# Patient Record
Sex: Female | Born: 1960
Health system: Southern US, Community
[De-identification: ages and names within clinical notes are randomized; demographics above are authoritative.]

## PROBLEM LIST (undated history)

## (undated) DIAGNOSIS — Z789 Other specified health status: Secondary | ICD-10-CM

## (undated) DIAGNOSIS — Q2112 Patent foramen ovale: Secondary | ICD-10-CM

## (undated) DIAGNOSIS — E0590 Thyrotoxicosis, unspecified without thyrotoxic crisis or storm: Secondary | ICD-10-CM

## (undated) DIAGNOSIS — Z720 Tobacco use: Secondary | ICD-10-CM

## (undated) DIAGNOSIS — G459 Transient cerebral ischemic attack, unspecified: Secondary | ICD-10-CM

## (undated) DIAGNOSIS — E041 Nontoxic single thyroid nodule: Secondary | ICD-10-CM

## (undated) DIAGNOSIS — E7800 Pure hypercholesterolemia, unspecified: Secondary | ICD-10-CM

## (undated) DIAGNOSIS — M25511 Pain in right shoulder: Secondary | ICD-10-CM

## (undated) DIAGNOSIS — D649 Anemia, unspecified: Secondary | ICD-10-CM

## (undated) DIAGNOSIS — M1990 Unspecified osteoarthritis, unspecified site: Secondary | ICD-10-CM

## (undated) DIAGNOSIS — R200 Anesthesia of skin: Secondary | ICD-10-CM

## (undated) DIAGNOSIS — I10 Essential (primary) hypertension: Secondary | ICD-10-CM

## (undated) DIAGNOSIS — J069 Acute upper respiratory infection, unspecified: Secondary | ICD-10-CM

## (undated) DIAGNOSIS — M25552 Pain in left hip: Secondary | ICD-10-CM

## (undated) DIAGNOSIS — D179 Benign lipomatous neoplasm, unspecified: Secondary | ICD-10-CM

## (undated) DIAGNOSIS — R1032 Left lower quadrant pain: Secondary | ICD-10-CM

## (undated) DIAGNOSIS — D125 Benign neoplasm of sigmoid colon: Secondary | ICD-10-CM

## (undated) DIAGNOSIS — G8929 Other chronic pain: Secondary | ICD-10-CM

## (undated) DIAGNOSIS — Z1211 Encounter for screening for malignant neoplasm of colon: Secondary | ICD-10-CM

## (undated) DIAGNOSIS — Z9289 Personal history of other medical treatment: Secondary | ICD-10-CM

## (undated) DIAGNOSIS — R7309 Other abnormal glucose: Secondary | ICD-10-CM

## (undated) DIAGNOSIS — M79606 Pain in leg, unspecified: Secondary | ICD-10-CM

## (undated) DIAGNOSIS — Q211 Atrial septal defect: Secondary | ICD-10-CM

## (undated) DIAGNOSIS — R6882 Decreased libido: Secondary | ICD-10-CM

## (undated) DIAGNOSIS — Z972 Presence of dental prosthetic device (complete) (partial): Secondary | ICD-10-CM

## (undated) DIAGNOSIS — N9089 Other specified noninflammatory disorders of vulva and perineum: Secondary | ICD-10-CM

## (undated) DIAGNOSIS — R7303 Prediabetes: Secondary | ICD-10-CM

## (undated) DIAGNOSIS — R634 Abnormal weight loss: Secondary | ICD-10-CM

## (undated) DIAGNOSIS — S20212A Contusion of left front wall of thorax, initial encounter: Secondary | ICD-10-CM

## (undated) DIAGNOSIS — I6381 Other cerebral infarction due to occlusion or stenosis of small artery: Secondary | ICD-10-CM

## (undated) DIAGNOSIS — M79643 Pain in unspecified hand: Secondary | ICD-10-CM

## (undated) DIAGNOSIS — N907 Vulvar cyst: Secondary | ICD-10-CM

## (undated) DIAGNOSIS — R0609 Other forms of dyspnea: Secondary | ICD-10-CM

## (undated) DIAGNOSIS — H9202 Otalgia, left ear: Secondary | ICD-10-CM

## (undated) DIAGNOSIS — M25512 Pain in left shoulder: Secondary | ICD-10-CM

## (undated) HISTORY — DX: Thyrotoxicosis, unspecified without thyrotoxic crisis or storm: E05.90

## (undated) HISTORY — DX: Transient cerebral ischemic attack, unspecified: G45.9

## (undated) HISTORY — DX: Atrial septal defect: Q21.1

## (undated) HISTORY — DX: Anesthesia of skin: R20.0

## (undated) HISTORY — DX: Otalgia, left ear: H92.02

## (undated) HISTORY — DX: Other cerebral infarction due to occlusion or stenosis of small artery: I63.81

## (undated) HISTORY — DX: Pain in leg, unspecified: M79.606

## (undated) HISTORY — DX: Pain in left hip: M25.552

## (undated) HISTORY — DX: Contusion of left front wall of thorax, initial encounter: S20.212A

## (undated) HISTORY — DX: Benign neoplasm of sigmoid colon: D12.5

## (undated) HISTORY — DX: Acute upper respiratory infection, unspecified: J06.9

## (undated) HISTORY — DX: Patent foramen ovale: Q21.12

## (undated) HISTORY — DX: Pain in unspecified hand: M79.643

## (undated) HISTORY — DX: Pain in left shoulder: M25.512

## (undated) HISTORY — DX: Essential (primary) hypertension: I10

## (undated) HISTORY — DX: Other abnormal glucose: R73.09

## (undated) HISTORY — DX: Personal history of other medical treatment: Z92.89

## (undated) HISTORY — DX: Other specified health status: Z78.9

## (undated) HISTORY — DX: Other chronic pain: G89.29

## (undated) HISTORY — DX: Tobacco use: Z72.0

## (undated) HISTORY — DX: Vulvar cyst: N90.7

## (undated) HISTORY — DX: Encounter for screening for malignant neoplasm of colon: Z12.11

## (undated) HISTORY — DX: Left lower quadrant pain: R10.32

## (undated) HISTORY — DX: Other specified noninflammatory disorders of vulva and perineum: N90.89

## (undated) HISTORY — DX: Decreased libido: R68.82

## (undated) HISTORY — DX: Other forms of dyspnea: R06.09

## (undated) HISTORY — DX: Pain in right shoulder: M25.511

## (undated) HISTORY — DX: Abnormal weight loss: R63.4

## (undated) HISTORY — DX: Pure hypercholesterolemia, unspecified: E78.00

## (undated) HISTORY — PX: DILATION AND CURETTAGE OF UTERUS: SHX78

## (undated) HISTORY — DX: Benign lipomatous neoplasm, unspecified: D17.9

## (undated) HISTORY — DX: Prediabetes: R73.03

---

## 2005-10-30 ENCOUNTER — Emergency Department: Payer: Self-pay | Admitting: Emergency Medicine

## 2007-06-10 ENCOUNTER — Observation Stay: Payer: Self-pay | Admitting: Internal Medicine

## 2007-06-18 ENCOUNTER — Ambulatory Visit: Payer: Self-pay

## 2007-06-19 ENCOUNTER — Ambulatory Visit: Payer: Self-pay

## 2008-12-22 ENCOUNTER — Emergency Department: Payer: Self-pay | Admitting: Emergency Medicine

## 2009-08-20 ENCOUNTER — Emergency Department: Payer: Self-pay | Admitting: Emergency Medicine

## 2009-09-20 ENCOUNTER — Emergency Department: Payer: Self-pay | Admitting: Unknown Physician Specialty

## 2013-03-14 HISTORY — PX: BREAST CYST ASPIRATION: SHX578

## 2013-05-20 ENCOUNTER — Ambulatory Visit: Payer: Self-pay

## 2013-05-22 ENCOUNTER — Ambulatory Visit: Payer: Self-pay

## 2013-12-11 ENCOUNTER — Emergency Department: Payer: Self-pay | Admitting: Emergency Medicine

## 2013-12-11 LAB — COMPREHENSIVE METABOLIC PANEL
ALT: 20 U/L
ANION GAP: 5 — AB (ref 7–16)
Albumin: 3.5 g/dL (ref 3.4–5.0)
Alkaline Phosphatase: 65 U/L
BUN: 9 mg/dL (ref 7–18)
Bilirubin,Total: 0.4 mg/dL (ref 0.2–1.0)
CO2: 28 mmol/L (ref 21–32)
Calcium, Total: 8.5 mg/dL (ref 8.5–10.1)
Chloride: 109 mmol/L — ABNORMAL HIGH (ref 98–107)
Creatinine: 0.7 mg/dL (ref 0.60–1.30)
EGFR (African American): >60
Glucose: 96 mg/dL (ref 65–99)
OSMOLALITY: 282 (ref 275–301)
Potassium: 3.7 mmol/L (ref 3.5–5.1)
SGOT(AST): 19 U/L (ref 15–37)
SODIUM: 142 mmol/L (ref 136–145)
Total Protein: 6.8 g/dL (ref 6.4–8.2)

## 2013-12-11 LAB — CBC
HCT: 38.6 % (ref 35.0–47.0)
HGB: 12.4 g/dL (ref 12.0–16.0)
MCH: 29.4 pg (ref 26.0–34.0)
MCHC: 32.1 g/dL (ref 32.0–36.0)
MCV: 92 fL (ref 80–100)
PLATELETS: 203 10*3/uL (ref 150–440)
RBC: 4.21 10*6/uL (ref 3.80–5.20)
RDW: 14.2 % (ref 11.5–14.5)
WBC: 5.2 10*3/uL (ref 3.6–11.0)

## 2013-12-11 LAB — TROPONIN I

## 2015-01-01 ENCOUNTER — Other Ambulatory Visit (INDEPENDENT_AMBULATORY_CARE_PROVIDER_SITE_OTHER): Payer: 59

## 2015-01-01 ENCOUNTER — Other Ambulatory Visit: Payer: Self-pay | Admitting: Family Medicine

## 2015-01-01 ENCOUNTER — Encounter: Payer: Self-pay | Admitting: Family Medicine

## 2015-01-01 ENCOUNTER — Ambulatory Visit (INDEPENDENT_AMBULATORY_CARE_PROVIDER_SITE_OTHER): Payer: 59 | Admitting: Family Medicine

## 2015-01-01 VITALS — BP 172/98 | HR 68 | Temp 98.5°F | Ht 63.47 in | Wt 142.6 lb

## 2015-01-01 DIAGNOSIS — M79643 Pain in unspecified hand: Secondary | ICD-10-CM | POA: Diagnosis not present

## 2015-01-01 DIAGNOSIS — R946 Abnormal results of thyroid function studies: Secondary | ICD-10-CM | POA: Diagnosis not present

## 2015-01-01 DIAGNOSIS — R7989 Other specified abnormal findings of blood chemistry: Secondary | ICD-10-CM

## 2015-01-01 DIAGNOSIS — I10 Essential (primary) hypertension: Secondary | ICD-10-CM | POA: Insufficient documentation

## 2015-01-01 HISTORY — DX: Pain in unspecified hand: M79.643

## 2015-01-01 LAB — CBC
HCT: 39.4 % (ref 36.0–46.0)
Hemoglobin: 13 g/dL (ref 12.0–15.0)
MCHC: 33.1 g/dL (ref 30.0–36.0)
MCV: 89.5 fl (ref 78.0–100.0)
PLATELETS: 247 10*3/uL (ref 150.0–400.0)
RBC: 4.4 Mil/uL (ref 3.87–5.11)
RDW: 14.1 % (ref 11.5–15.5)
WBC: 7.3 10*3/uL (ref 4.0–10.5)

## 2015-01-01 LAB — COMPREHENSIVE METABOLIC PANEL
ALK PHOS: 59 U/L (ref 39–117)
ALT: 11 U/L (ref 0–35)
AST: 14 U/L (ref 0–37)
Albumin: 4.1 g/dL (ref 3.5–5.2)
BUN: 15 mg/dL (ref 6–23)
CO2: 31 mEq/L (ref 19–32)
Calcium: 9.7 mg/dL (ref 8.4–10.5)
Chloride: 106 mEq/L (ref 96–112)
Creatinine, Ser: 0.71 mg/dL (ref 0.40–1.20)
GFR: 111.07 mL/min (ref >60.00)
GLUCOSE: 79 mg/dL (ref 70–99)
Potassium: 3.7 mEq/L (ref 3.5–5.1)
Sodium: 142 mEq/L (ref 135–145)
TOTAL PROTEIN: 6.8 g/dL (ref 6.0–8.3)
Total Bilirubin: 0.4 mg/dL (ref 0.2–1.2)

## 2015-01-01 LAB — LIPID PANEL
Cholesterol: 189 mg/dL (ref 0–200)
HDL: 61 mg/dL (ref >39.00)
LDL Cholesterol: 109 mg/dL — ABNORMAL HIGH (ref 0–99)
NONHDL: 128.07
Total CHOL/HDL Ratio: 3
Triglycerides: 95 mg/dL (ref 0.0–149.0)
VLDL: 19 mg/dL (ref 0.0–40.0)

## 2015-01-01 LAB — TSH: TSH: 0.3 u[IU]/mL — ABNORMAL LOW (ref 0.35–4.50)

## 2015-01-01 MED ORDER — WRIST SPLINT/COCK-UP/RIGHT M MISC: 1.0000 | Freq: Every day | Status: DC

## 2015-01-01 MED ORDER — AMLODIPINE BESYLATE 5 MG PO TABS: 5.0000 mg | ORAL_TABLET | Freq: Every day | ORAL | Status: DC

## 2015-01-01 MED ORDER — WRIST SPLINT/COCK-UP/LEFT M MISC: 1.0000 | Freq: Every day | Status: DC

## 2015-01-01 NOTE — Assessment & Plan Note (Signed)
Pain in bilateral hands with associated fingertip numbness in all 10 fingers. This is a chronic issue likely related to her repetitive motions at work. She has had improvement with wearing a wrist brace making me think this is likely related to carpal tunnel syndrome. She did have negative testing Tinel's and Phalen's today. She is neurovascularly intact. She had a negative exam today. We will put her in cockup splints for her bilateral wrists and advised her to wear them nightly. We'll monitor with this intervention. Given return precautions.

## 2015-01-01 NOTE — Progress Notes (Signed)
Pre visit review using our clinic review tool, if applicable. No additional management support is needed unless otherwise documented below in the visit note. 

## 2015-01-01 NOTE — Assessment & Plan Note (Addendum)
BP elevated today. Not in the hypertensive urgency range. She is asymptomatic at this time. Vision check reveals equal vision in both eyes. Lightheadedness likely due to orthostasis though orthostatics today were negative. Has no other symptoms with that. She has no lightheadedness today. We'll continue to monitor this. We will start the patient on amlodipine 5 mg daily to limit the drop in blood pressure over the acute period given that her blood pressures probably been in this range for at least the past year. She'll follow-up in one week for nurse visit to recheck her blood pressure. She'll see me in 2 weeks. We'll check a CMET, CBC, TSH, and lipid panel. I did discuss smoking cessation with the patient today and we discussed nicotine patches, though with the patient's blood pressure being elevated and the risk for increased blood pressure on nicotine patches we will attempt to control her blood pressure first and then start nicotine patches for smoking cessation. She will contact an eye doctor for a vision check. She is given return precautions.

## 2015-01-01 NOTE — Patient Instructions (Signed)
Nice to meet you. We will start you on a blood pressure medicine called amlodipine. He should take this every day. We will prescribe you wrist splints for possible carpal tunnel syndrome. Please call and set up an appointment with an eye doctor. If you develop chest pain, shortness of breath, headache, numbness, weakness, vision changes, abdominal pain, swelling in her feet or hands, or lightheadedness please seek medical attention immediately. I will see back in one week to follow-up on her blood pressure.

## 2015-01-01 NOTE — Progress Notes (Signed)
Patient ID: Alyssa Matthews, female   DOB: 09-10-1960, 54 y.o.   MRN: [id number]  Tommi Rumps, MD Phone: [phone]  Alyssa Matthews is a 54 y.o. female who presents today for new patient visit.  HYPERTENSION Disease Monitoring Home BP Monitoring does not check Chest pain- no    Dyspnea- no Medications Compliance-  not on medication.  Edema- no Patient notes about a year ago she was seen in the ED and was noted to have had elevated blood pressure at that time. States she was prescribed medication at that time. She does not know the name of that medication. She does not remember what her blood pressure was. She notes it has been higher than it is today in the past. She has no headache. She has no chest pain. She has no shortness of breath. She has no edema. She has no abdominal pain. She does note occasional lightheadedness on going from laying or seated to standing. This gets better better after eating. She has no palpitations. She has no history of vertigo. She denies current vision issues. She does note occasionally seeing dots in her vision, though none at this time.  Hand pain: Patient notes intermittent bilateral hand pain for a number of years. She notes there is minimal associated numbness in her fingertips. She notes occasionally feels as though her fingertips are swollen though the rest of her hand is not swollen. She has no other areas of numbness. She has no weakness. She has no edema elsewhere. 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 The pain bothers her in her hands mostly when she is asleep. She does wear a brace on her right wrist occasionally and this helps with the symptoms.   Active Ambulatory Problems    Diagnosis Date Noted  . Essential hypertension 01/01/2015  . Hand pain 01/01/2015   Resolved Ambulatory Problems    Diagnosis Date Noted  . No Resolved Ambulatory Problems   Past Medical History  Diagnosis Date  .  Hypertension   . History of blood transfusion     Family History  Problem Relation Age of Onset  . Hypertension Mother   . Kidney disease Brother     Social History   Social History  . Marital Status: Single    Spouse Name: N/A  . Number of Children: N/A  . Years of Education: N/A   Occupational History  . Not on file.   Social History Main Topics  . Smoking status: Current Every Day Smoker  . Smokeless tobacco: Not on file  . Alcohol Use: 0.0 oz/week    0 Standard drinks or equivalent per week  . Drug Use: No  . Sexual Activity: Not on file   Other Topics Concern  . Not on file   Social History Narrative   Patient works on an Designer, television/film set.    ROS   General:  Negative for unexplained weight loss, fever Skin: Negative for new or changing mole, sore that won't heal HEENT: Negative for trouble hearing, trouble seeing, ringing in ears, mouth sores, hoarseness, change in voice, dysphagia. CV:  Negative for chest pain, dyspnea, edema, palpitations Resp: Negative for cough, dyspnea, hemoptysis GI: Negative for nausea, vomiting, diarrhea, constipation, abdominal pain, melena, hematochezia. GU: Negative for dysuria, incontinence, urinary hesitance, hematuria, vaginal or penile discharge, polyuria, sexual difficulty, lumps in testicle or breasts MSK: Positive for fingertip swelling in fingertip numbness, Negative for muscle cramps or aches, joint pain  or swelling Neuro: Negative for headaches, weakness, numbness, dizziness, passing out/fainting Psych: Negative for depression, anxiety, memory problems  Objective  Physical Exam Filed Vitals:   01/01/15 0802  BP: 172/98  Pulse: 68  Temp: 98.5 F (36.9 C)   Laying blood pressure 162/98 pulse 63 Sitting blood pressure 174/106 pulse 67 Standing blood pressure 168/104 pulse 69  Physical Exam  Constitutional: She is well-developed, well-nourished, and in no distress.  HENT:  Head: Normocephalic and atraumatic.  Right  Ear: External ear normal.  Left Ear: External ear normal.  Mouth/Throat: Oropharynx is clear and moist. No oropharyngeal exudate.  Eyes: Conjunctivae are normal. Pupils are equal, round, and reactive to light.  Neck: Neck supple.  Cardiovascular: Normal rate, regular rhythm and normal heart sounds.  Exam reveals no gallop and no friction rub.   No murmur heard. Pulmonary/Chest: Effort normal and breath sounds normal. No respiratory distress. She has no wheezes. She has no rales.  Abdominal: Soft. Bowel sounds are normal. She exhibits no distension. There is no tenderness. There is no rebound and no guarding.  Musculoskeletal: She exhibits no edema.  Bilateral hands and fingers with no joint swelling or soft tissue swelling, there is no erythema, there is no numbness in the fingers, negative Tinel's, negative Phalen's, 2+ radial pulses, fingers warm and well perfused  Lymphadenopathy:    She has no cervical adenopathy.  Neurological: She is alert.  CN 2-12 intact, 5/5 strength in bilateral biceps, triceps, grip, quads, hamstrings, plantar and dorsiflexion, sensation to light touch intact in bilateral UE and LE, normal gait, 2+ patellar reflexes  Skin: Skin is warm and dry. She is not diaphoretic.  Psychiatric: Mood and affect normal.     Assessment/Plan:   Essential hypertension BP elevated today. Not in the hypertensive urgency range. She is asymptomatic at this time. Vision check reveals equal vision in both eyes. Lightheadedness likely due to orthostasis though orthostatics today were negative. Has no other symptoms with that. She has no lightheadedness today. We'll continue to monitor this. We will start the patient on amlodipine 5 mg daily to limit the drop in blood pressure over the acute period given that her blood pressures probably been in this range for at least the past year. She'll follow-up in one week for nurse visit to recheck her blood pressure. She'll see me in 2 weeks. We'll  check a CMET, CBC, TSH, and lipid panel. I did discuss smoking cessation with the patient today and we discussed nicotine patches, though with the patient's blood pressure being elevated and the risk for increased blood pressure on nicotine patches we will attempt to control her blood pressure first and then start nicotine patches for smoking cessation. She will contact an eye doctor for a vision check. She is given return precautions.  Hand pain Pain in bilateral hands with associated fingertip numbness in all 10 fingers. This is a chronic issue likely related to her repetitive motions at work. She has had improvement with wearing a wrist brace making me think this is likely related to carpal tunnel syndrome. She did have negative testing Tinel's and Phalen's today. She is neurovascularly intact. She had a negative exam today. We will put her in cockup splints for her bilateral wrists and advised her to wear them nightly. We'll monitor with this intervention. Given return precautions.    Orders Placed This Encounter  Procedures  . Comp Met (CMET)  . Lipid Profile  . CBC  . TSH    Meds ordered  this encounter  Medications  . amLODipine (NORVASC) 5 MG tablet    Sig: Take 1 tablet (5 mg total) by mouth daily.    Dispense:  90 tablet    Refill:  0  . Elastic Bandages & Supports (WRIST SPLINT/COCK-UP/LEFT M) MISC    Sig: 1 Device by Does not apply route at bedtime.    Dispense:  1 each    Refill:  0  . Elastic Bandages & Supports (WRIST SPLINT/COCK-UP/RIGHT M) MISC    Sig: 1 Device by Does not apply route at bedtime.    Dispense:  1 each    Refill:  0    Tommi Rumps

## 2015-01-02 LAB — T3, FREE: T3 FREE: 3.4 pg/mL (ref 2.3–4.2)

## 2015-01-02 LAB — T4, FREE: Free T4: 0.78 ng/dL (ref 0.60–1.60)

## 2015-01-08 ENCOUNTER — Ambulatory Visit (INDEPENDENT_AMBULATORY_CARE_PROVIDER_SITE_OTHER): Payer: 59

## 2015-01-08 VITALS — BP 148/78 | HR 75 | Resp 20

## 2015-01-08 DIAGNOSIS — I10 Essential (primary) hypertension: Secondary | ICD-10-CM

## 2015-01-08 MED ORDER — ATORVASTATIN CALCIUM 40 MG PO TABS: 40.0000 mg | ORAL_TABLET | Freq: Every day | ORAL | Status: DC

## 2015-01-08 MED ORDER — AMLODIPINE BESYLATE 10 MG PO TABS: 10.0000 mg | ORAL_TABLET | Freq: Every day | ORAL | Status: DC

## 2015-01-08 NOTE — Progress Notes (Signed)
Patient ID: Alyssa Matthews, female   DOB: October 27, 1960, 54 y.o.   MRN: [id number] Patient should increase dose of amlodipine to 10 mg daily. She can take two 5 mg tablets until her prescription runs out then she can pick up the new prescription for 10 mg tablets.  I will send in lipitor 40 mg daily for the patient to start on for cholesterol.

## 2015-01-08 NOTE — Progress Notes (Addendum)
Patient came in for BP check, started her Amlodipine last week.  Has net had any issues with taking it.  Checked BP in bilateral arms.  See Vitals for documentation.  Reviewed labs with patient also as she missed a call from the [place] yesterday.  See result note for details.  Patient would like to be put on cholesterol medication, per you results note.  Please advise drug and dosage.    Please advise any changes.

## 2015-01-08 NOTE — Addendum Note (Signed)
Addended by: Leone Haven on: 01/08/2015 10:37 AM   Modules accepted: Orders

## 2015-01-08 NOTE — Progress Notes (Signed)
Left a message to return my call.

## 2015-01-09 NOTE — Progress Notes (Signed)
Spoke with patient about her labs. She will go to the pharmacy tomorrow to pick up the medications.

## 2015-01-15 ENCOUNTER — Other Ambulatory Visit (HOSPITAL_COMMUNITY)
Admission: RE | Admit: 2015-01-15 | Discharge: 2015-01-15 | Disposition: A | Discharge status: Home / Self Care | Payer: 59 | Source: Ambulatory Visit | Attending: Family Medicine | Admitting: Family Medicine

## 2015-01-15 ENCOUNTER — Encounter: Payer: Self-pay | Admitting: Family Medicine

## 2015-01-15 ENCOUNTER — Ambulatory Visit (INDEPENDENT_AMBULATORY_CARE_PROVIDER_SITE_OTHER): Payer: 59 | Admitting: Family Medicine

## 2015-01-15 VITALS — BP 122/78 | HR 77 | Temp 98.6°F | Ht 63.47 in | Wt 142.2 lb

## 2015-01-15 DIAGNOSIS — Z01411 Encounter for gynecological examination (general) (routine) with abnormal findings: Secondary | ICD-10-CM | POA: Insufficient documentation

## 2015-01-15 DIAGNOSIS — Z1151 Encounter for screening for human papillomavirus (HPV): Secondary | ICD-10-CM | POA: Diagnosis not present

## 2015-01-15 DIAGNOSIS — Z23 Encounter for immunization: Secondary | ICD-10-CM | POA: Diagnosis not present

## 2015-01-15 DIAGNOSIS — Z1231 Encounter for screening mammogram for malignant neoplasm of breast: Secondary | ICD-10-CM

## 2015-01-15 DIAGNOSIS — M25511 Pain in right shoulder: Secondary | ICD-10-CM | POA: Insufficient documentation

## 2015-01-15 DIAGNOSIS — Z0000 Encounter for general adult medical examination without abnormal findings: Secondary | ICD-10-CM | POA: Insufficient documentation

## 2015-01-15 DIAGNOSIS — Z1211 Encounter for screening for malignant neoplasm of colon: Secondary | ICD-10-CM | POA: Diagnosis not present

## 2015-01-15 DIAGNOSIS — M25512 Pain in left shoulder: Secondary | ICD-10-CM | POA: Insufficient documentation

## 2015-01-15 DIAGNOSIS — Z114 Encounter for screening for human immunodeficiency virus [HIV]: Secondary | ICD-10-CM | POA: Diagnosis not present

## 2015-01-15 DIAGNOSIS — Z1159 Encounter for screening for other viral diseases: Secondary | ICD-10-CM | POA: Diagnosis not present

## 2015-01-15 DIAGNOSIS — Z124 Encounter for screening for malignant neoplasm of cervix: Secondary | ICD-10-CM

## 2015-01-15 HISTORY — DX: Pain in left shoulder: M25.512

## 2015-01-15 HISTORY — DX: Pain in right shoulder: M25.511

## 2015-01-15 NOTE — Assessment & Plan Note (Signed)
Resolved with wrist splints. We will continue these and continue to monitor.

## 2015-01-15 NOTE — Assessment & Plan Note (Addendum)
At goal today. We'll continue amlodipine. She was advised on DASH diet. She is additionally on Lipitor given ASCVD risk percentage. She appears to be tolerating this well. She did have a single episode of muscle aches yesterday in her legs this is after a long period of time at work. She's not had any persistent aching. We will continue to monitor this if it recurs we will need to discuss decreasing her Lipitor dose. He is given return precautions.

## 2015-01-15 NOTE — Assessment & Plan Note (Signed)
Intermittent right shoulder pain mostly occurring when she lays on her right shoulder. She had no specific injury. She has benign exam today. She is neurovascularly intact. Discussed that we will continue to monitor this and if it recurs or becomes persistent she will follow-up.

## 2015-01-15 NOTE — Patient Instructions (Signed)
Nice to see you. We will call with the results of your blood work. Please monitor your shoulder pain and if this worsens please let us know.  Please look up the dash diet and use this as a guide for diet change.

## 2015-01-15 NOTE — Assessment & Plan Note (Addendum)
Appears to be doing well today. Her BMI is in the normal range. Her blood pressure is well-controlled today. Pap smear was completed today with HPV testing. We will order mammogram as well. We will order GI referral for colonoscopy. I advised on exercise and diet as well. We did discuss smoking cessation and she set a quit date for early January. She'll follow-up at that time for further discussion of quitting smoking. He is given a flu shot today. She reports having Tdap sometime in the last 5 years. We will also check a hepatitis C and HIV.

## 2015-01-15 NOTE — Progress Notes (Signed)
Patient ID: Alyssa Matthews, female   DOB: 1960/08/24, 54 y.o.   MRN: [id number]  Tommi Rumps, MD Phone: [phone]  Alyssa Matthews is a 54 y.o. female who presents today for follow-up and physical.  Patient comes in for physical exam today. She notes her last Pap smear was 2-3 years ago. She's not ever had a history of abnormal Pap smears. She is postmenopausal with her last period 3 years ago. She states she had a mammogram 2 years ago and this revealed an abnormality she had a biopsy and this ended up being a cyst. She's never had a colonoscopy. She does not exercise. Diet wise she eats whatever she wants. She notes she eats lots of chicken and pork chops. She does eat vegetables such as green beans and color drains. She does smoke and wants to set a quit date at the first of next year.  HYPERTENSION Disease Monitoring Home BP Monitoring not checking Chest pain- no    Dyspnea- no Medications Compliance-  taking amlodipine 10 mg daily. Lightheadedness-  no  Edema- no She's also on Lipitor given ASCVD risk score. He started the medication on Saturday. She denies right upper quadrant pain. Notes minimal aching in her legs yesterday after being at work for a long period of time, this resolved and she has no aching at this time.  Right shoulder discomfort: Patient notes intermittently and infrequently for several weeks she has had a sharp lateral pain in her right shoulder. This is a shooting pain. It occurs mostly when she lays on that shoulder. She thinks this is related to work. Is not worse with movement. She's not taking any medicine for it. No injury   She also notes that the wrist splints have helped with the fingertip issues. She's not had any recurrent tingling or swelling in her fingertips since wearing the response. She is wearing them nightly.   Active Ambulatory Problems    Diagnosis Date Noted  . Essential hypertension 01/01/2015  . Hand pain 01/01/2015  . Right shoulder  pain 01/15/2015  . Annual physical exam 01/15/2015   Resolved Ambulatory Problems    Diagnosis Date Noted  . No Resolved Ambulatory Problems   Past Medical History  Diagnosis Date  . Hypertension   . History of blood transfusion     Family History  Problem Relation Age of Onset  . Hypertension Mother   . Kidney disease Brother     Social History   Social History  . Marital Status: Single    Spouse Name: N/A  . Number of Children: N/A  . Years of Education: N/A   Occupational History  . Not on file.   Social History Main Topics  . Smoking status: Current Every Day Smoker  . Smokeless tobacco: Not on file  . Alcohol Use: 0.0 oz/week    0 Standard drinks or equivalent per week  . Drug Use: No  . Sexual Activity: Not on file   Other Topics Concern  . Not on file   Social History Narrative   Patient works on an Designer, television/film set.    ROS   General:  Negative for unexplained weight loss, fever Skin: Negative for new or changing mole, sore that won't heal HEENT: Negative for trouble hearing, trouble seeing, ringing in ears, mouth sores, hoarseness, change in voice, dysphagia. CV:  Negative for chest pain, dyspnea, edema, palpitations Resp: Negative for cough, dyspnea, hemoptysis GI: Negative for nausea, vomiting, diarrhea, constipation, abdominal pain, melena, hematochezia. GU: Positive  for decreased sexual desire (reports this is not bothersome for her and she would like to monitor this), Negative for dysuria, incontinence, urinary hesitance, hematuria, vaginal or penile discharge, polyuria, sexual difficulty, lumps in testicle or breasts MSK: Negative for muscle cramps or aches, joint pain or swelling Neuro: Negative for headaches, weakness, numbness, dizziness, passing out/fainting Psych: Negative for depression, anxiety, memory problems  Objective  Physical Exam Filed Vitals:   01/15/15 0758  BP: 122/78  Pulse: 77  Temp: 98.6 F (37 C)    BP Readings from  Last 3 Encounters:  01/15/15 122/78  01/08/15 148/78  01/01/15 172/98   Wt Readings from Last 3 Encounters:  01/15/15 142 lb 3.2 oz (64.501 kg)  01/01/15 142 lb 9.6 oz (64.683 kg)    Physical Exam  Constitutional: She is well-developed, well-nourished, and in no distress.  HENT:  Head: Normocephalic and atraumatic.  Right Ear: External ear normal.  Left Ear: External ear normal.  Mouth/Throat: Oropharynx is clear and moist. No oropharyngeal exudate.  Eyes: Conjunctivae are normal. Pupils are equal, round, and reactive to light.  Neck: Neck supple.  Cardiovascular: Normal rate, regular rhythm and normal heart sounds.  Exam reveals no gallop and no friction rub.   No murmur heard. Pulmonary/Chest: Effort normal and breath sounds normal. No respiratory distress. She has no wheezes. She has no rales.  Abdominal: Soft. Bowel sounds are normal. She exhibits no distension. There is no tenderness. There is no rebound and no guarding.  Genitourinary: Vagina normal, uterus normal, cervix normal, right adnexa normal and left adnexa normal. No vaginal discharge found.  Musculoskeletal: She exhibits no edema.  No asymmetry of the shoulders, right shoulder with no tenderness, swelling, or pain with range of motion, has normal range of motion, negative empty can and speeds tests, hands warm and well perfused, left shoulder with no tenderness, swelling or pain with range of motion, has normal range of motion, negative empty can and speeds tests, hands warm and well-perfused  Lymphadenopathy:    She has no cervical adenopathy.  Neurological: She is alert. Gait normal.  5 out of 5 strength in bilateral biceps, triceps, grip, sensation to light touch intact in bilateral upper extremities  Skin: Skin is warm and dry. She is not diaphoretic.  Psychiatric: Mood and affect normal.     Assessment/Plan:   Essential hypertension At goal today. We'll continue amlodipine. She was advised on DASH diet. She  is additionally on Lipitor given ASCVD risk percentage. She appears to be tolerating this well. She did have a single episode of muscle aches yesterday in her legs this is after a long period of time at work. She's not had any persistent aching. We will continue to monitor this if it recurs we will need to discuss decreasing her Lipitor dose. He is given return precautions.  Hand pain Resolved with wrist splints. We will continue these and continue to monitor.  Right shoulder pain Intermittent right shoulder pain mostly occurring when she lays on her right shoulder. She had no specific injury. She has benign exam today. She is neurovascularly intact. Discussed that we will continue to monitor this and if it recurs or becomes persistent she will follow-up.  Annual physical exam Appears to be doing well today. Her BMI is in the normal range. Her blood pressure is well-controlled today. Pap smear was completed today with HPV testing. We will order mammogram as well. We will order GI referral for colonoscopy. I advised on exercise and diet as  well. We did discuss smoking cessation and she set a quit date for early January. She'll follow-up at that time for further discussion of quitting smoking. He is given a flu shot today. She reports having Tdap sometime in the last 5 years. We will also check a hepatitis C and HIV.    Orders Placed This Encounter  Procedures  . MM Digital Screening    Standing Status: Future     Number of Occurrences:      Standing Expiration Date: 03/16/2016    Order Specific Question:  Reason for Exam (SYMPTOM  OR DIAGNOSIS REQUIRED)    Answer:  screening mammogram    Order Specific Question:  Is the patient pregnant?    Answer:  No    Order Specific Question:  Preferred imaging location?    Answer:  [facility]  . Flu Vaccine QUAD 36+ mos IM  . HIV antibody (with reflex)  . Hepatitis C Antibody  . Ambulatory referral to Gastroenterology    Referral Priority:   Routine    Referral Type:  Consultation    Referral Reason:  Specialty Services Required    Number of Visits Requested:  1    Tommi Rumps

## 2015-01-15 NOTE — Progress Notes (Signed)
Pre visit review using our clinic review tool, if applicable. No additional management support is needed unless otherwise documented below in the visit note. 

## 2015-01-16 LAB — HEPATITIS C ANTIBODY: HCV Ab: NEGATIVE

## 2015-01-16 LAB — HIV ANTIBODY (ROUTINE TESTING W REFLEX): HIV: NONREACTIVE

## 2015-01-19 ENCOUNTER — Telehealth: Payer: Self-pay

## 2015-01-19 ENCOUNTER — Other Ambulatory Visit: Payer: Self-pay

## 2015-01-19 LAB — CYTOLOGY - PAP

## 2015-01-19 NOTE — Telephone Encounter (Signed)
Gastroenterology Pre-Procedure Review  Request Date: 03/30/15 Requesting Physician: Dr. Ludwig Lean  PATIENT REVIEW QUESTIONS: The patient responded to the following health history questions as indicated:    1. Are you having any GI issues? no 2. Do you have a personal history of Polyps? no 3. Do you have a family history of Colon Cancer or Polyps? no 4. Diabetes Mellitus? no 5. Joint replacements in the past 12 months?no 6. Major health problems in the past 3 months?no 7. Any artificial heart valves, MVP, or defibrillator?no    MEDICATIONS & ALLERGIES:    Patient reports the following regarding taking any anticoagulation/antiplatelet therapy:   Plavix, Coumadin, Eliquis, Xarelto, Lovenox, Pradaxa, Brilinta, or Effient? no Aspirin? no  Patient confirms/reports the following medications:  Current Outpatient Prescriptions  Medication Sig Dispense Refill  . amLODipine (NORVASC) 10 MG tablet Take 1 tablet (10 mg total) by mouth daily. 90 tablet 1  . atorvastatin (LIPITOR) 40 MG tablet Take 1 tablet (40 mg total) by mouth daily. 90 tablet 3  . Elastic Bandages & Supports (WRIST SPLINT/COCK-UP/LEFT M) MISC 1 Device by Does not apply route at bedtime. 1 each 0  . Elastic Bandages & Supports (WRIST SPLINT/COCK-UP/RIGHT M) MISC 1 Device by Does not apply route at bedtime. 1 each 0   No current facility-administered medications for this visit.    Patient confirms/reports the following allergies:  No Known Allergies  No orders of the defined types were placed in this encounter.    AUTHORIZATION INFORMATION Primary Insurance: 1D#: Group #:  Secondary Insurance: 1D#: Group #:  SCHEDULE INFORMATION: Date: 03/30/15 Time: Location: [place]

## 2015-01-20 ENCOUNTER — Encounter: Payer: Self-pay | Admitting: Family Medicine

## 2015-01-30 ENCOUNTER — Encounter: Payer: Self-pay | Admitting: Family Medicine

## 2015-02-13 ENCOUNTER — Ambulatory Visit: Payer: 59

## 2015-02-17 ENCOUNTER — Ambulatory Visit
Admission: RE | Admit: 2015-02-17 | Discharge: 2015-02-17 | Disposition: A | Discharge status: Home / Self Care | Payer: 59 | Source: Ambulatory Visit | Attending: Family Medicine | Admitting: Family Medicine

## 2015-02-17 ENCOUNTER — Encounter: Payer: Self-pay | Admitting: Family Medicine

## 2015-02-17 DIAGNOSIS — Z1231 Encounter for screening mammogram for malignant neoplasm of breast: Secondary | ICD-10-CM | POA: Diagnosis present

## 2015-03-20 ENCOUNTER — Encounter: Payer: Self-pay | Admitting: *Deleted

## 2015-03-26 ENCOUNTER — Other Ambulatory Visit: Payer: Self-pay

## 2015-03-26 DIAGNOSIS — Z1211 Encounter for screening for malignant neoplasm of colon: Secondary | ICD-10-CM

## 2015-03-26 MED ORDER — PEG 3350-KCL-NA BICARB-NACL 420 G PO SOLR: 4000.0000 mL | ORAL | Status: DC

## 2015-03-27 NOTE — Discharge Instructions (Signed)

## 2015-03-30 ENCOUNTER — Ambulatory Visit
Admission: RE | Admit: 2015-03-30 | Discharge: 2015-03-30 | Disposition: A | Discharge status: Home / Self Care | Payer: 59 | Source: Ambulatory Visit | Attending: Gastroenterology | Admitting: Gastroenterology

## 2015-03-30 ENCOUNTER — Other Ambulatory Visit: Payer: Self-pay | Admitting: Gastroenterology

## 2015-03-30 ENCOUNTER — Encounter
Admission: RE | Disposition: A | Discharge status: Home / Self Care | Payer: Self-pay | Source: Ambulatory Visit | Attending: Gastroenterology

## 2015-03-30 ENCOUNTER — Ambulatory Visit: Payer: 59 | Admitting: Student in an Organized Health Care Education/Training Program

## 2015-03-30 ENCOUNTER — Encounter: Payer: Self-pay | Admitting: *Deleted

## 2015-03-30 DIAGNOSIS — K635 Polyp of colon: Secondary | ICD-10-CM | POA: Diagnosis not present

## 2015-03-30 DIAGNOSIS — D125 Benign neoplasm of sigmoid colon: Secondary | ICD-10-CM

## 2015-03-30 DIAGNOSIS — I10 Essential (primary) hypertension: Secondary | ICD-10-CM | POA: Insufficient documentation

## 2015-03-30 DIAGNOSIS — Z803 Family history of malignant neoplasm of breast: Secondary | ICD-10-CM | POA: Insufficient documentation

## 2015-03-30 DIAGNOSIS — Z841 Family history of disorders of kidney and ureter: Secondary | ICD-10-CM | POA: Diagnosis not present

## 2015-03-30 DIAGNOSIS — F17200 Nicotine dependence, unspecified, uncomplicated: Secondary | ICD-10-CM | POA: Diagnosis not present

## 2015-03-30 DIAGNOSIS — Z9889 Other specified postprocedural states: Secondary | ICD-10-CM | POA: Diagnosis not present

## 2015-03-30 DIAGNOSIS — Z79899 Other long term (current) drug therapy: Secondary | ICD-10-CM | POA: Insufficient documentation

## 2015-03-30 DIAGNOSIS — E7800 Pure hypercholesterolemia, unspecified: Secondary | ICD-10-CM | POA: Diagnosis not present

## 2015-03-30 DIAGNOSIS — Z8249 Family history of ischemic heart disease and other diseases of the circulatory system: Secondary | ICD-10-CM | POA: Insufficient documentation

## 2015-03-30 DIAGNOSIS — Z1211 Encounter for screening for malignant neoplasm of colon: Secondary | ICD-10-CM | POA: Diagnosis not present

## 2015-03-30 HISTORY — PX: COLONOSCOPY WITH PROPOFOL: SHX5780

## 2015-03-30 HISTORY — PX: POLYPECTOMY: SHX5525

## 2015-03-30 HISTORY — DX: Presence of dental prosthetic device (complete) (partial): Z97.2

## 2015-03-30 HISTORY — DX: Pure hypercholesterolemia, unspecified: E78.00

## 2015-03-30 SURGERY — COLONOSCOPY WITH PROPOFOL
Anesthesia: Monitor Anesthesia Care | Wound class: Contaminated

## 2015-03-30 MED ORDER — OXYCODONE HCL 5 MG PO TABS: 5.0000 mg | ORAL_TABLET | Freq: Once | ORAL | Status: DC | PRN

## 2015-03-30 MED ORDER — LACTATED RINGERS IV SOLN
INTRAVENOUS | Status: DC
  Administered 2015-03-30: 10:00:00 via INTRAVENOUS

## 2015-03-30 MED ORDER — PROPOFOL 10 MG/ML IV BOLUS
INTRAVENOUS | Status: DC | PRN
  Administered 2015-03-30: 20 mg via INTRAVENOUS
  Administered 2015-03-30: 30 mg via INTRAVENOUS
  Administered 2015-03-30: 20 mg via INTRAVENOUS
  Administered 2015-03-30: 30 mg via INTRAVENOUS
  Administered 2015-03-30: 70 mg via INTRAVENOUS
  Administered 2015-03-30: 30 mg via INTRAVENOUS
  Administered 2015-03-30 (×2): 20 mg via INTRAVENOUS

## 2015-03-30 MED ORDER — LIDOCAINE HCL (CARDIAC) 20 MG/ML IV SOLN
INTRAVENOUS | Status: DC | PRN
  Administered 2015-03-30: 50 mg via INTRAVENOUS

## 2015-03-30 MED ORDER — OXYCODONE HCL 5 MG/5ML PO SOLN: 5.0000 mg | Freq: Once | ORAL | Status: DC | PRN

## 2015-03-30 MED ORDER — STERILE WATER FOR IRRIGATION IR SOLN
Status: DC | PRN
  Administered 2015-03-30: 11:00:00

## 2015-03-30 SURGICAL SUPPLY — 28 items

## 2015-03-30 NOTE — Transfer of Care (Signed)
Immediate Anesthesia Transfer of Care Note  Patient: Alyssa Matthews  Procedure(s) Performed: Procedure(s): COLONOSCOPY WITH PROPOFOL (N/A) POLYPECTOMY  Patient Location: PACU  Anesthesia Type: MAC  Level of Consciousness: awake, alert  and patient cooperative  Airway and Oxygen Therapy: Patient Spontanous Breathing and Patient connected to supplemental oxygen  Post-op Assessment: Post-op Vital signs reviewed, Patient's Cardiovascular Status Stable, Respiratory Function Stable, Patent Airway and No signs of Nausea or vomiting  Post-op Vital Signs: Reviewed and stable  Complications: No apparent anesthesia complications

## 2015-03-30 NOTE — H&P (Signed)
  [facility]  [address], [place], [place] [address] Phone: [phone] Fax : [phone]  Primary Care Physician:  Tommi Rumps, MD Primary Gastroenterologist:  Dr. Allen Norris  Pre-Procedure History & Physical: HPI:  Alyssa Matthews is a 55 y.o. female is here for a screening colonoscopy.   Past Medical History  Diagnosis Date  . Hypertension   . History of blood transfusion   . Wears dentures     partial upper  . Hypercholesteremia     Past Surgical History  Procedure Laterality Date  . Breast cyst aspiration  2015  . Dilation and curettage of uterus      Prior to Admission medications   Medication Sig Start Date End Date Taking? Authorizing Provider  amLODipine (NORVASC) 10 MG tablet Take 1 tablet (10 mg total) by mouth daily. 01/08/15  Yes Leone Haven, MD  atorvastatin (LIPITOR) 40 MG tablet Take 1 tablet (40 mg total) by mouth daily. 01/08/15  Yes Leone Haven, MD  Elastic Bandages & Supports (WRIST SPLINT/COCK-UP/LEFT M) MISC 1 Device by Does not apply route at bedtime. 01/01/15  Yes Leone Haven, MD  Elastic Bandages & Supports (WRIST SPLINT/COCK-UP/RIGHT M) MISC 1 Device by Does not apply route at bedtime. 01/01/15  Yes Leone Haven, MD  polyethylene glycol-electrolytes (TRILYTE) 420 g solution Take 4,000 mLs by mouth as directed. Drink one 8 oz glass every 30 mins until stools are clear 03/26/15  Yes Lucilla Lame, MD    Allergies as of 01/19/2015  . (No Known Allergies)    Family History  Problem Relation Age of Onset  . Hypertension Mother   . Kidney disease Brother   . Breast cancer Neg Hx     Social History   Social History  . Marital Status: Single    Spouse Name: N/A  . Number of Children: N/A  . Years of Education: N/A   Occupational History  . Not on file.   Social History Main Topics  . Smoking status: Current Every Day Smoker -- 0.50 packs/day for 30 years  . Smokeless tobacco: Not on file  .  Alcohol Use: 0.6 oz/week    0 Standard drinks or equivalent, 1 Cans of beer per week  . Drug Use: No  . Sexual Activity: Not on file   Other Topics Concern  . Not on file   Social History Narrative   Patient works on an Design[employer], television/film set.    Review of Systems: See HPI, otherwise negative ROS  Physical Exam: BP 117/71 mmHg  Pulse 71  Temp(Src) 97.9 F (36.6 C)  Resp 16  Ht 5' 3.5" (1.613 m)  Wt 143 lb (64.864 kg)  BMI 24.93 kg/m2  SpO2 100% General:   Alert,  pleasant and cooperative in NAD Head:  Normocephalic and atraumatic. Neck:  Supple; no masses or thyromegaly. Lungs:  Clear throughout to auscultation.    Heart:  Regular rate and rhythm. Abdomen:  Soft, nontender and nondistended. Normal bowel sounds, without guarding, and without rebound.   Neurologic:  Alert and  oriented x4;  grossly normal neurologically.  Impression/Plan: Alyssa Matthews is now here to undergo a screening colonoscopy.  Risks, benefits, and alternatives regarding colonoscopy have been reviewed with the patient.  Questions have been answered.  All parties agreeable.

## 2015-03-30 NOTE — Anesthesia Postprocedure Evaluation (Signed)
Anesthesia Post Note  Patient: Alyssa Matthews  Procedure(s) Performed: Procedure(s) (LRB): COLONOSCOPY WITH PROPOFOL (N/A) POLYPECTOMY  Patient location during evaluation: PACU Anesthesia Type: MAC Level of consciousness: awake and alert Pain management: pain level controlled Vital Signs Assessment: post-procedure vital signs reviewed and stable Respiratory status: spontaneous breathing, nonlabored ventilation, respiratory function stable and patient connected to nasal cannula oxygen Cardiovascular status: stable and blood pressure returned to baseline Anesthetic complications: no    Cire Clute

## 2015-03-30 NOTE — Anesthesia Preprocedure Evaluation (Addendum)
Anesthesia Evaluation  Patient identified by MRN, date of birth, ID band  Reviewed: NPO status   History of Anesthesia Complications Negative for: history of anesthetic complications  Airway Mallampati: II  TM Distance: >3 FB Neck ROM: full    Dental  (+) Upper Dentures, Missing,  Poor dentition; many missing:   Pulmonary Current Smoker,    Pulmonary exam normal        Cardiovascular Exercise Tolerance: Good hypertension, Normal cardiovascular exam     Neuro/Psych negative neurological ROS  negative psych ROS   GI/Hepatic negative GI ROS, Neg liver ROS,   Endo/Other  negative endocrine ROS  Renal/GU negative Renal ROS  negative genitourinary   Musculoskeletal Wrist / arm pain   Abdominal   Peds  Hematology negative hematology ROS (+)   Anesthesia Other Findings   Reproductive/Obstetrics                            Anesthesia Physical Anesthesia Plan  ASA: II  Anesthesia Plan: MAC   Post-op Pain Management:    Induction:   Airway Management Planned:   Additional Equipment:   Intra-op Plan:   Post-operative Plan:   Informed Consent: I have reviewed the patients History and Physical, chart, labs and discussed the procedure including the risks, benefits and alternatives for the proposed anesthesia with the patient or authorized representative who has indicated his/her understanding and acceptance.     Plan Discussed with: CRNA  Anesthesia Plan Comments:        Anesthesia Quick Evaluation

## 2015-03-30 NOTE — Op Note (Signed)
[facility] Patient Name: Alyssa Matthews Procedure Date: 03/30/2015 10:24 AM MRN: [id number] Account #: [id number] Date of Birth: [birth year] Admit Type: Outpatient Age: 55 Room: [facility] OR ROOM 01 Gender: Female Note Status: Finalized Procedure:         Colonoscopy Indications:       Screening for colorectal malignant neoplasm Providers:         Lucilla Lame, MD Referring MD:      Randall Hiss g. Caryl Bis (Referring MD) Medicines:         Propofol per Anesthesia Complications:     No immediate complications. Procedure:         Pre-Anesthesia Assessment:                    - Prior to the procedure, a History and Physical was                     performed, and patient medications and allergies were                     reviewed. The patient's tolerance of previous anesthesia                     was also reviewed. The risks and benefits of the procedure                     and the sedation options and risks were discussed with the                     patient. All questions were answered, and informed consent                     was obtained. Prior Anticoagulants: The patient has taken                     no previous anticoagulant or antiplatelet agents. ASA                     Grade Assessment: II - A patient with mild systemic                     disease. After reviewing the risks and benefits, the                     patient was deemed in satisfactory condition to undergo                     the procedure.                    After obtaining informed consent, the colonoscope was                     passed under direct vision. Throughout the procedure, the                     patient's blood pressure, pulse, and oxygen saturations                     were monitored continuously. The Olympus CF H180AL                     colonoscope (S#: [id number]) was introduced through the anus  and advanced to the the cecum, identified by appendiceal               orifice and ileocecal valve. The colonoscopy was performed                     without difficulty. The patient tolerated the procedure                     well. The quality of the bowel preparation was excellent. Findings:      The perianal and digital rectal examinations were normal.      Three sessile polyps were found in the sigmoid colon. The polyps were 2       to 4 mm in size. These polyps were removed with a cold biopsy forceps.       Resection and retrieval were complete. Impression:        - Three 2 to 4 mm polyps in the sigmoid colon. Resected                     and retrieved. Recommendation:    - Await pathology results.                    - Repeat colonoscopy in 5 years if polyp adenoma and 10                     years if hyperplastic Procedure Code(s): --- Professional ---                    [phone], Colonoscopy, flexible; with biopsy, single or                     multiple Diagnosis Code(s): --- Professional ---                    Z12.11, Encounter for screening for malignant neoplasm of                     colon                    D12.5, Benign neoplasm of sigmoid colon CPT copyright 2014 American Medical Association. All rights reserved. The codes documented in this report are preliminary and upon coder review may  be revised to meet current compliance requirements. Lucilla Lame, MD 03/30/2015 10:46:40 AM This report has been signed electronically. Number of Addenda: 0 Note Initiated On: 03/30/2015 10:24 AM Scope Withdrawal Time: 0 hours 5 minutes 48 seconds  Total Procedure Duration: 0 hours 10 minutes 37 seconds       [facility], [facility], [facility]

## 2015-03-30 NOTE — Anesthesia Procedure Notes (Signed)
Procedure Name: MAC Performed by: Markeesha Char Pre-anesthesia Checklist: Patient identified, Emergency Drugs available, Suction available, Timeout performed and Patient being monitored Patient Re-evaluated:Patient Re-evaluated prior to inductionOxygen Delivery Method: Nasal cannula Placement Confirmation: positive ETCO2       

## 2015-03-31 ENCOUNTER — Encounter: Payer: Self-pay | Admitting: Gastroenterology

## 2015-04-02 ENCOUNTER — Encounter: Payer: Self-pay | Admitting: Gastroenterology

## 2015-04-16 ENCOUNTER — Encounter: Payer: Self-pay | Admitting: Family Medicine

## 2015-04-16 ENCOUNTER — Ambulatory Visit (INDEPENDENT_AMBULATORY_CARE_PROVIDER_SITE_OTHER): Payer: 59 | Admitting: Family Medicine

## 2015-04-16 VITALS — BP 130/72 | HR 72 | Temp 98.5°F | Ht 63.47 in | Wt 148.4 lb

## 2015-04-16 DIAGNOSIS — M791 Myalgia, unspecified site: Secondary | ICD-10-CM

## 2015-04-16 DIAGNOSIS — M79605 Pain in left leg: Secondary | ICD-10-CM | POA: Diagnosis not present

## 2015-04-16 DIAGNOSIS — R6882 Decreased libido: Secondary | ICD-10-CM

## 2015-04-16 DIAGNOSIS — I10 Essential (primary) hypertension: Secondary | ICD-10-CM

## 2015-04-16 DIAGNOSIS — D179 Benign lipomatous neoplasm, unspecified: Secondary | ICD-10-CM | POA: Diagnosis not present

## 2015-04-16 DIAGNOSIS — M79606 Pain in leg, unspecified: Secondary | ICD-10-CM | POA: Insufficient documentation

## 2015-04-16 HISTORY — DX: Pain in leg, unspecified: M79.606

## 2015-04-16 HISTORY — DX: Benign lipomatous neoplasm, unspecified: D17.9

## 2015-04-16 HISTORY — DX: Decreased libido: R68.82

## 2015-04-16 LAB — COMPREHENSIVE METABOLIC PANEL
ALK PHOS: 149 U/L — AB (ref 39–117)
ALT: 40 U/L — AB (ref 0–35)
AST: 26 U/L (ref 0–37)
Albumin: 4 g/dL (ref 3.5–5.2)
BILIRUBIN TOTAL: 0.4 mg/dL (ref 0.2–1.2)
BUN: 12 mg/dL (ref 6–23)
CO2: 27 meq/L (ref 19–32)
CREATININE: 0.57 mg/dL (ref 0.40–1.20)
Calcium: 9.4 mg/dL (ref 8.4–10.5)
Chloride: 108 mEq/L (ref 96–112)
GFR: 142.95 mL/min (ref >60.00)
GLUCOSE: 99 mg/dL (ref 70–99)
Potassium: 3.7 mEq/L (ref 3.5–5.1)
Sodium: 144 mEq/L (ref 135–145)
TOTAL PROTEIN: 6.8 g/dL (ref 6.0–8.3)

## 2015-04-16 LAB — CK: Total CK: 79 U/L (ref 7–177)

## 2015-04-16 LAB — LDL CHOLESTEROL, DIRECT: Direct LDL: 75 mg/dL

## 2015-04-16 NOTE — Assessment & Plan Note (Signed)
Lesion on left flank is most consistent with lipoma. Discussed benign nature of this with patient. also discussed obtaining an ultrasound to confirm this, though patient declined this at this time and opted to continue monitoring. She's given return precautions.

## 2015-04-16 NOTE — Assessment & Plan Note (Signed)
Decreased sex drive since onset of menopause. She does report vaginal dryness that might be contributing to this. We will have patient try vaginal moisturizer and lubricant over-the-counter with sexual activity to see if this is beneficial. Did discuss treatment with hormone therapy, though we decided to wait on this to see if she had improvement.

## 2015-04-16 NOTE — Assessment & Plan Note (Signed)
Patient with several weeks of left lower leg discomfort. This is resolved at this time. She has a benign exam today. She is neurovascularly intact. Possibly related to her cholesterol medication given that this improved after stopping this. Unlikely DVT given lack of swelling, calf tenderness, and cords. Unlikely coming from her back given lack of back pain. No red flags. Could be related arthritis in her knees. We'll continue to monitor. We'll check a CK to evaluate for cause of Lipitor. Given return precautions.

## 2015-04-16 NOTE — Assessment & Plan Note (Signed)
At goal. Asymptomatic. We'll continue Norvasc. We'll check CMP, CK, and direct LDL to evaluate whether or not to start back on Lipitor and at what dose.

## 2015-04-16 NOTE — Progress Notes (Signed)
Patient ID: Alyssa Matthews, female   DOB: 08/19/1960, 55 y.o.   MRN: [id number]  Alyssa Rumps, MD Phone: [phone]  Alyssa Matthews is a 55 y.o. female who presents today for follow-up.  Left leg pain: Patient notes several weeks ago she developed left leg discomfort that is described as an aching. Notes it occurred from her feet up to her knee. She had no swelling in her calf. No weakness. She does note maybe her toes felt a little numb, though this resolved and she attributes this to wearing steel toed boots. No back pain with this. No saddle anesthesia, bowel or bladder incontinence, fevers, or history of cancer. Notes wearing a knee support helped. Notes the pain resolved after stopping her Lipitor. No pain in the last week. No history of DVT. No recent surgeries. No recent long trips. Denies injury.  Lipoma: Patient notes having a knot on her left flank. It is been there for many years. It does not hurt. There are no skin changes overlying this. She dates she's been told it was a lipoma. Notes it has grown mildly in size over the years. It is soft.  Decreased sex drive: Patient notes decreased sexual desire since she went through menopause. She does note vaginal dryness since going through menopause. Notes this has been occurring over the last 2 years. She states the dryness makes sex uncomfortable. There is no overt pain with sex. She denies depression. She notes having sex every 1-2 weeks with her husband. No vaginal bleeding. Last menstrual period was 2 years ago.  Hypertension: Patient is taking her blood pressure medicine. No chest pain or shortness of breath. She is on a statin for cardiovascular protection though has been off of that over the last week.  PMH: Smoker   ROS see history of present illness  Objective  Physical Exam Filed Vitals:   04/16/15 0900  BP: 130/72  Pulse: 72  Temp: 98.5 F (36.9 C)    BP Readings from Last 3 Encounters:  04/16/15 130/72    03/30/15 122/72  01/15/15 122/78   Wt Readings from Last 3 Encounters:  04/16/15 148 lb 6.4 oz (67.314 kg)  03/30/15 143 lb (64.864 kg)  01/15/15 142 lb 3.2 oz (64.501 kg)    Physical Exam  Constitutional: She is well-developed, well-nourished, and in no distress.  HENT:  Head: Normocephalic and atraumatic.  Cardiovascular: Normal rate, regular rhythm and normal heart sounds.  Exam reveals no gallop and no friction rub.   No murmur heard. Pulmonary/Chest: Effort normal. No respiratory distress. She has no wheezes. She has no rales.  Abdominal: Soft. She exhibits no distension. There is no tenderness. There is no rebound and no guarding.  Genitourinary:  Normal labia, mildly atrophic vaginal mucosa, normal cervix, no discomfort on bimanual exam, no cervical motion tenderness, no adnexal masses or tenderness  Musculoskeletal: She exhibits no edema.  Left flank with 4-5 cm soft tissue mass consistent with lipoma, no overlying skin changes, no tenderness, no warmth  Neurological: She is alert. Gait normal.  Skin: Skin is warm and dry. She is not diaphoretic.   left calf 34 cm, right calf 33 cm Lateral feet warm and well perfused.   Assessment/Plan: Please see individual problem list.  Leg pain Patient with several weeks of left lower leg discomfort. This is resolved at this time. She has a benign exam today. She is neurovascularly intact. Possibly related to her cholesterol medication given that this improved after stopping this. Unlikely DVT  given lack of swelling, calf tenderness, and cords. Unlikely coming from her back given lack of back pain. No red flags. Could be related arthritis in her knees. We'll continue to monitor. We'll check a CK to evaluate for cause of Lipitor. Given return precautions.  Lipoma Lesion on left flank is most consistent with lipoma. Discussed benign nature of this with patient. also discussed obtaining an ultrasound to confirm this, though patient  declined this at this time and opted to continue monitoring. She's given return precautions.  Essential hypertension At goal. Asymptomatic. We'll continue Norvasc. We'll check CMP, CK, and direct LDL to evaluate whether or not to start back on Lipitor and at what dose.  Decreased sex drive Decreased sex drive since onset of menopause. She does report vaginal dryness that might be contributing to this. We will have patient try vaginal moisturizer and lubricant over-the-counter with sexual activity to see if this is beneficial. Did discuss treatment with hormone therapy, though we decided to wait on this to see if she had improvement.    Orders Placed This Encounter  Procedures  . Comp Met (CMET)  . CK (Creatine Kinase)  . Direct LDL    Alyssa Matthews

## 2015-04-16 NOTE — Progress Notes (Signed)
Pre visit review using our clinic review tool, if applicable. No additional management support is needed unless otherwise documented below in the visit note. 

## 2015-04-16 NOTE — Patient Instructions (Signed)
Nice to see you. Please try a vaginal moisturizer for your vaginal dryness. Treating this may help increase her sexual desire. You can try Vagisil feminine moisturizer or another over-the-counter moisturizer. We will check some lab work to evaluate your leg pain. This could have been related to your cholesterol medication. Please continue to monitor the area on her left side. It appears to be a lipoma. If this enlarges or changes in anyway please let us know. If you develop chest pain, shortness of breath, swelling in one leg or the other, numbness, weakness, loss of bowel or bladder function, numbness between her legs, or any new or change in symptoms please seek medical attention.

## 2015-04-17 ENCOUNTER — Other Ambulatory Visit: Payer: Self-pay | Admitting: Family Medicine

## 2015-04-17 DIAGNOSIS — R945 Abnormal results of liver function studies: Principal | ICD-10-CM

## 2015-04-17 DIAGNOSIS — R7989 Other specified abnormal findings of blood chemistry: Secondary | ICD-10-CM

## 2015-04-22 ENCOUNTER — Other Ambulatory Visit: Payer: 59

## 2015-04-24 ENCOUNTER — Other Ambulatory Visit (INDEPENDENT_AMBULATORY_CARE_PROVIDER_SITE_OTHER): Payer: 59

## 2015-04-24 DIAGNOSIS — R946 Abnormal results of thyroid function studies: Secondary | ICD-10-CM

## 2015-04-24 DIAGNOSIS — R945 Abnormal results of liver function studies: Secondary | ICD-10-CM

## 2015-04-24 DIAGNOSIS — R7989 Other specified abnormal findings of blood chemistry: Secondary | ICD-10-CM | POA: Diagnosis not present

## 2015-04-24 LAB — COMPREHENSIVE METABOLIC PANEL
ALBUMIN: 4 g/dL (ref 3.5–5.2)
ALK PHOS: 109 U/L (ref 39–117)
ALT: 18 U/L (ref 0–35)
AST: 16 U/L (ref 0–37)
BUN: 17 mg/dL (ref 6–23)
CHLORIDE: 107 meq/L (ref 96–112)
CO2: 28 mEq/L (ref 19–32)
Calcium: 9.5 mg/dL (ref 8.4–10.5)
Creatinine, Ser: 0.62 mg/dL (ref 0.40–1.20)
GFR: 129.72 mL/min (ref >60.00)
GLUCOSE: 115 mg/dL — AB (ref 70–99)
POTASSIUM: 4 meq/L (ref 3.5–5.1)
SODIUM: 140 meq/L (ref 135–145)
Total Bilirubin: 0.4 mg/dL (ref 0.2–1.2)
Total Protein: 7.1 g/dL (ref 6.0–8.3)

## 2015-04-25 LAB — T4: T4, Total: 8.3 ug/dL (ref 4.5–12.0)

## 2015-04-28 ENCOUNTER — Encounter: Payer: Self-pay | Admitting: Family Medicine

## 2015-05-18 ENCOUNTER — Ambulatory Visit (INDEPENDENT_AMBULATORY_CARE_PROVIDER_SITE_OTHER): Payer: 59 | Admitting: Family Medicine

## 2015-05-18 ENCOUNTER — Encounter: Payer: Self-pay | Admitting: Family Medicine

## 2015-05-18 VITALS — BP 126/72 | HR 70 | Temp 98.3°F | Ht 63.47 in | Wt 146.5 lb

## 2015-05-18 DIAGNOSIS — R7303 Prediabetes: Secondary | ICD-10-CM

## 2015-05-18 DIAGNOSIS — H9202 Otalgia, left ear: Secondary | ICD-10-CM | POA: Insufficient documentation

## 2015-05-18 DIAGNOSIS — R6882 Decreased libido: Secondary | ICD-10-CM | POA: Diagnosis not present

## 2015-05-18 DIAGNOSIS — Z789 Other specified health status: Secondary | ICD-10-CM

## 2015-05-18 DIAGNOSIS — Z720 Tobacco use: Secondary | ICD-10-CM | POA: Insufficient documentation

## 2015-05-18 DIAGNOSIS — Z87891 Personal history of nicotine dependence: Secondary | ICD-10-CM

## 2015-05-18 DIAGNOSIS — Z889 Allergy status to unspecified drugs, medicaments and biological substances status: Secondary | ICD-10-CM

## 2015-05-18 DIAGNOSIS — R7309 Other abnormal glucose: Secondary | ICD-10-CM

## 2015-05-18 HISTORY — DX: Other specified health status: Z78.9

## 2015-05-18 HISTORY — DX: Otalgia, left ear: H92.02

## 2015-05-18 HISTORY — DX: Other abnormal glucose: R73.09

## 2015-05-18 HISTORY — DX: Prediabetes: R73.03

## 2015-05-18 HISTORY — DX: Personal history of nicotine dependence: Z87.891

## 2015-05-18 LAB — HEMOGLOBIN A1C: Hgb A1c MFr Bld: 6 % (ref 4.6–6.5)

## 2015-05-18 MED ORDER — NICOTINE POLACRILEX 2 MG MT GUM: 2.0000 mg | CHEWING_GUM | OROMUCOSAL | Status: DC | PRN

## 2015-05-18 NOTE — Progress Notes (Signed)
Patient ID: Alyssa Matthews, female   DOB: 1960/09/30, 55 y.o.   MRN: [id number]  Alyssa Rumps, MD Phone: [phone]  Alyssa Matthews is a 55 y.o. female who presents today for follow-up.  Decreased sex drive: Patient notes this is significantly improved after using over-the-counter vaginal lubricants. Minimal discomfort with sexual activity now. No pain. Sexual drive is improved.  Statin intolerance: Patient was placed on statin to help decrease her cardiovascular risk though developed muscle aches and mildly elevated LFTs. Her LFTs returned to normal. She is not on a statin currently. No muscle aches or abdominal pain. She notes she has changed her diet by stopping sodas and decreasing sweets. She is not exercising. She does stand on her feet and walk around all day at work.  Tobacco abuse: Patient notes she does want to quit. She smokes a little under a pack per day. She does not smoke while at work so she smokes posterior cigarettes at home. She denies chest pain or shortness of breath.  Left ear ache: Notes this is brief and occurring intermittently for a long time. No changes in hearing. No drainage. No tinnitus. She does note some rhinorrhea. No postnasal drip. No sneezing. She typically cleans her ears out with a Q-tip or a hairpin.  Elevated glucose: On her last CMP she had an elevated glucose. No history of diabetes. She does note family history of diabetes.  PMH: Current smoker  ROS the history of present illness  Objective  Physical Exam Filed Vitals:   05/18/15 0951  BP: 126/72  Pulse: 70  Temp: 98.3 F (36.8 C)    BP Readings from Last 3 Encounters:  05/18/15 126/72  04/16/15 130/72  03/30/15 122/72   Wt Readings from Last 3 Encounters:  05/18/15 146 lb 8 oz (66.452 kg)  04/16/15 148 lb 6.4 oz (67.314 kg)  03/30/15 143 lb (64.864 kg)    Physical Exam  Constitutional: She is well-developed, well-nourished, and in no distress.  HENT:  Head: Normocephalic  and atraumatic.  Right Ear: External ear normal.  Left Ear: External ear normal.  Mouth/Throat: Oropharynx is clear and moist. No oropharyngeal exudate.  Left TM mildly erythematous, no fluid behind the TM, mild irritation of the external canal, right TM normal with normal external canal  Eyes: Conjunctivae are normal. Pupils are equal, round, and reactive to light.  Cardiovascular: Normal rate, regular rhythm and normal heart sounds.   Pulmonary/Chest: Effort normal and breath sounds normal. No respiratory distress. She has no wheezes. She has no rales.  Neurological: She is alert. Gait normal.  Skin: Skin is warm and dry. She is not diaphoretic.     Assessment/Plan: Please see individual problem list.  Decreased sex drive Much improved. Suspect this was related to vaginal atrophy. She'll continue vaginal moisturizer and lubricant over-the-counter. We'll continue to follow.  Statin intolerance Symptoms of muscle aches and mild elevation in LFTs. Both of these improved with stopping the statin. Patient's ASCVD risk percentage was 6.1% placing her in the consider statin group. Given her intolerance we will work on diet and exercise and quitting smoking and then consider lower dose statin in the future.  Left ear pain Chronic intermittent issue. Mild irritation of the left tympanic membrane. With rhinorrhea could be related to allergic rhinitis. Advised to not use Q-tips or hairpins to clean her ears. She will trial Claritin over-the-counter to see if this is beneficial.  Elevated glucose Mildly elevated on last check. We'll check an A1c today.  Tobacco abuse Discuss smoking cessation at length. Discussed nicotine replacement and opted for nicotine gum. Prescription was sent to the pharmacy. Patient will see if this is cheaper than over-the-counter. She'll follow-up in 6 weeks to see how she is doing.    Orders Placed This Encounter  Procedures  . HgB A1c    Meds ordered this  encounter  Medications  . nicotine polacrilex (EQ NICOTINE POLACRILEX) 2 MG gum    Sig: Take 1 each (2 mg total) by mouth every 2 (two) hours as needed for smoking cessation.    Dispense:  100 tablet    Refill:  2    Alyssa Matthews

## 2015-05-18 NOTE — Assessment & Plan Note (Signed)
Symptoms of muscle aches and mild elevation in LFTs. Both of these improved with stopping the statin. Patient's ASCVD risk percentage was 6.1% placing her in the consider statin group. Given her intolerance we will work on diet and exercise and quitting smoking and then consider lower dose statin in the future.

## 2015-05-18 NOTE — Assessment & Plan Note (Signed)
Chronic intermittent issue. Mild irritation of the left tympanic membrane. With rhinorrhea could be related to allergic rhinitis. Advised to not use Q-tips or hairpins to clean her ears. She will trial Claritin over-the-counter to see if this is beneficial.

## 2015-05-18 NOTE — Patient Instructions (Signed)
Nice to see you. Please continue to work on diet and exercise. Please add exercise 1-2 days a week to start with and increase as tolerated. Please start the nicotine gum to help quit smoking. You can start over-the-counter Claritin to see if this will help with your ear discomfort and runny nose. If you develop chest pain, shortness of breath, ringing in her ears, worsening ear pain, or any new or changing symptoms please seek medical attention.

## 2015-05-18 NOTE — Assessment & Plan Note (Signed)
Mildly elevated on last check. We'll check an A1c today.

## 2015-05-18 NOTE — Assessment & Plan Note (Signed)
Discuss smoking cessation at length. Discussed nicotine replacement and opted for nicotine gum. Prescription was sent to the pharmacy. Patient will see if this is cheaper than over-the-counter. She'll follow-up in 6 weeks to see how she is doing.

## 2015-05-18 NOTE — Progress Notes (Signed)
Pre visit review using our clinic review tool, if applicable. No additional management support is needed unless otherwise documented below in the visit note. 

## 2015-05-18 NOTE — Assessment & Plan Note (Signed)
Much improved. Suspect this was related to vaginal atrophy. She'll continue vaginal moisturizer and lubricant over-the-counter. We'll continue to follow.

## 2015-06-29 ENCOUNTER — Ambulatory Visit (INDEPENDENT_AMBULATORY_CARE_PROVIDER_SITE_OTHER): Payer: 59 | Admitting: Family Medicine

## 2015-06-29 ENCOUNTER — Encounter: Payer: Self-pay | Admitting: Family Medicine

## 2015-06-29 VITALS — BP 106/66 | HR 79 | Temp 98.4°F | Ht 63.47 in | Wt 148.4 lb

## 2015-06-29 DIAGNOSIS — R208 Other disturbances of skin sensation: Secondary | ICD-10-CM | POA: Diagnosis not present

## 2015-06-29 DIAGNOSIS — R200 Anesthesia of skin: Secondary | ICD-10-CM

## 2015-06-29 DIAGNOSIS — R1032 Left lower quadrant pain: Secondary | ICD-10-CM | POA: Insufficient documentation

## 2015-06-29 DIAGNOSIS — I10 Essential (primary) hypertension: Secondary | ICD-10-CM | POA: Diagnosis not present

## 2015-06-29 DIAGNOSIS — E7800 Pure hypercholesterolemia, unspecified: Secondary | ICD-10-CM | POA: Diagnosis not present

## 2015-06-29 DIAGNOSIS — Z720 Tobacco use: Secondary | ICD-10-CM | POA: Diagnosis not present

## 2015-06-29 HISTORY — DX: Pure hypercholesterolemia, unspecified: E78.00

## 2015-06-29 HISTORY — DX: Anesthesia of skin: R20.0

## 2015-06-29 HISTORY — DX: Left lower quadrant pain: R10.32

## 2015-06-29 NOTE — Assessment & Plan Note (Addendum)
Discussed smoking cessation at length. Patient plans to quit today. She will use the nicotine lozenges as previously prescribed. I did discuss Chantix and Wellbutrin though she wanted to try nicotine replacement first.

## 2015-06-29 NOTE — Assessment & Plan Note (Signed)
Blood pressure at goal. Continue current medications.

## 2015-06-29 NOTE — Assessment & Plan Note (Signed)
Intermittent chronic issue. Worsened over the last several days. Neurologically intact at this time. No back pain or red flags for back pain. Given bilateral nature unlikely to be related to a central nervous system process. Suspect nerve impingement as cause. Could additionally be neuropathy. We will give her back exercises to trial. She will continue to monitor. If worsens or changes she will let us know. Given return precautions.

## 2015-06-29 NOTE — Assessment & Plan Note (Signed)
LDL previously elevated though most recent check was improved. Was on statin for cardiovascular risk reduction. She had been on a statin though did not tolerate this due to myalgias. We discussed diet and exercise at length today. She will work on diet and exercise moving forward. We will plan to recheck her cholesterol in 3 months.

## 2015-06-29 NOTE — Assessment & Plan Note (Signed)
Single episode relieved with bowel movement one month ago. Benign abdominal exam today. No recurrence. Suspect either related to constipation or abdominal cramping associated with her bowel movement. She will monitor for recurrence. Given return precautions.

## 2015-06-29 NOTE — Progress Notes (Signed)
Patient ID: Alyssa Matthews, female   DOB: 1961/02/26, 55 y.o.   MRN: [id number]  Tommi Rumps, MD Phone: [phone]  Alyssa Matthews is a 55 y.o. female who presents today for follow-up.  HYPERLIPIDEMIA Symptoms Chest pain on exertion:  No   Leg claudication:   No Patient is supposed to be working on diet and exercise control this. She has not really changed her diet since we last saw each other. She has cut back on her sodas some. Does eat fried fatty foods fairly frequently. Not much sweets. She is not exercising at all this time. Plans to start walking as the weather stays nicer. Was intolerant of statin due to muscle aches.  HYPERTENSION Disease Monitoring Home BP Monitoring no Chest pain- no    Dyspnea- no Medications Compliance-  taking amlodipine.  Edema- no  Tobacco abuse: Notes she plans to quit today. Notes she has not been using the lozenges consistently. Notes her father was recently diagnosed with tonsillar cancer related to his smoking and she wants to quit as a result of this.  Patient notes occasionally her bilateral feet will feel numb. This has been going on for a long time. Occurs more on the left side compared to the right side. Typically occurs when she is laying down. Has been worse over the last several days. It is intermittent. No weakness with this. No back pain. No saddle anesthesia, loss of bowel or bladder function, fevers, or history of cancer. No numbness at this time.  Patient does note about a month ago she had a single episode of left lower quadrant abdominal discomfort that resolved after having a bowel movement. No blood in her stool with this. No nausea or vomiting. Has not recurred. No abdominal pain at this time.   PMH: Smoker   ROS see history of present illness  Objective  Physical Exam Filed Vitals:   06/29/15 0859  BP: 106/66  Pulse: 79  Temp: 98.4 F (36.9 C)    BP Readings from Last 3 Encounters:  06/29/15 106/66  05/18/15  126/72  04/16/15 130/72   Wt Readings from Last 3 Encounters:  06/29/15 148 lb 6.4 oz (67.314 kg)  05/18/15 146 lb 8 oz (66.452 kg)  04/16/15 148 lb 6.4 oz (67.314 kg)    Physical Exam  Constitutional: She is well-developed, well-nourished, and in no distress.  HENT:  Head: Normocephalic and atraumatic.  Mouth/Throat: Oropharynx is clear and moist. No oropharyngeal exudate.  Eyes: Conjunctivae are normal. Pupils are equal, round, and reactive to light.  Neck: Neck supple.  Cardiovascular: Normal rate, regular rhythm and normal heart sounds.   Pulmonary/Chest: Effort normal and breath sounds normal.  Abdominal: Soft. Bowel sounds are normal. She exhibits no distension. There is no tenderness. There is no rebound and no guarding.  Musculoskeletal:  No midline spine tenderness, no midline spine step-off, no muscular back tenderness  Lymphadenopathy:    She has no cervical adenopathy.  Neurological: She is alert.  CN 2-12 intact, 5/5 strength in bilateral biceps, triceps, grip, quads, hamstrings, plantar and dorsiflexion, sensation to light touch intact in bilateral UE and LE, normal gait, 2+ patellar reflexes  Skin: Skin is warm and dry. She is not diaphoretic.     Assessment/Plan: Please see individual problem list.  Essential hypertension Blood pressure at goal. Continue current medications.  Tobacco abuse Discussed smoking cessation at length. Patient plans to quit today. She will use the nicotine lozenges as previously prescribed. I did discuss Chantix and  Wellbutrin though she wanted to try nicotine replacement first.  Elevated LDL cholesterol level LDL previously elevated though most recent check was improved. Was on statin for cardiovascular risk reduction. She had been on a statin though did not tolerate this due to myalgias. We discussed diet and exercise at length today. She will work on diet and exercise moving forward. We will plan to recheck her cholesterol in 3  months.  Bilateral numbness of feet Intermittent chronic issue. Worsened over the last several days. Neurologically intact at this time. No back pain or red flags for back pain. Given bilateral nature unlikely to be related to a central nervous system process. Suspect nerve impingement as cause. Could additionally be neuropathy. We will give her back exercises to trial. She will continue to monitor. If worsens or changes she will let us know. Given return precautions.  Abdominal pain, left lower quadrant Single episode relieved with bowel movement one month ago. Benign abdominal exam today. No recurrence. Suspect either related to constipation or abdominal cramping associated with her bowel movement. She will monitor for recurrence. Given return precautions.    Tommi Rumps, MD [place]

## 2015-06-29 NOTE — Progress Notes (Signed)
Pre visit review using our clinic review tool, if applicable. No additional management support is needed unless otherwise documented below in the visit note. 

## 2015-06-29 NOTE — Patient Instructions (Addendum)
Nice to see you. Please quit smoking today as we discussed. Please do the exercises for your back to see if this helps with her feet. Start taking an 81 mg aspirin.  If you develop any persistent numbness, weakness, numbness or legs, loss of bowel or bladder function, fevers, chest pain, shortness breath, or any new or changing symptoms please seek medical attention.  Back Exercises If you have pain in your back, do these exercises 2-3 times each day or as told by your doctor. When the pain goes away, do the exercises once each day, but repeat the steps more times for each exercise (do more repetitions). If you do not have pain in your back, do these exercises once each day or as told by your doctor. EXERCISES Single Knee to Chest Do these steps 3-5 times in a row for each leg: 1. Lie on your back on a firm bed or the floor with your legs stretched out. 2. Bring one knee to your chest. 3. Hold your knee to your chest by grabbing your knee or thigh. 4. Pull on your knee until you feel a gentle stretch in your lower back. 5. Keep doing the stretch for 10-30 seconds. 6. Slowly let go of your leg and straighten it. Pelvic Tilt Do these steps 5-10 times in a row: 1. Lie on your back on a firm bed or the floor with your legs stretched out. 2. Bend your knees so they point up to the ceiling. Your feet should be flat on the floor. 3. Tighten your lower belly (abdomen) muscles to press your lower back against the floor. This will make your tailbone point up to the ceiling instead of pointing down to your feet or the floor. 4. Stay in this position for 5-10 seconds while you gently tighten your muscles and breathe evenly. Cat-Cow Do these steps until your lower back bends more easily: 1. Get on your hands and knees on a firm surface. Keep your hands under your shoulders, and keep your knees under your hips. You may put padding under your knees. 2. Let your head hang down, and make your tailbone point  down to the floor so your lower back is round like the back of a cat. 3. Stay in this position for 5 seconds. 4. Slowly lift your head and make your tailbone point up to the ceiling so your back hangs low (sags) like the back of a cow. 5. Stay in this position for 5 seconds. Press-Ups Do these steps 5-10 times in a row: 1. Lie on your belly (face-down) on the floor. 2. Place your hands near your head, about shoulder-width apart. 3. While you keep your back relaxed and keep your hips on the floor, slowly straighten your arms to raise the top half of your body and lift your shoulders. Do not use your back muscles. To make yourself more comfortable, you may change where you place your hands. 4. Stay in this position for 5 seconds. 5. Slowly return to lying flat on the floor. Bridges Do these steps 10 times in a row: 1. Lie on your back on a firm surface. 2. Bend your knees so they point up to the ceiling. Your feet should be flat on the floor. 3. Tighten your butt muscles and lift your butt off of the floor until your waist is almost as high as your knees. If you do not feel the muscles working in your butt and the back of your thighs, slide your  feet 1-2 inches farther away from your butt. 4. Stay in this position for 3-5 seconds. 5. Slowly lower your butt to the floor, and let your butt muscles relax. If this exercise is too easy, try doing it with your arms crossed over your chest. Belly Crunches Do these steps 5-10 times in a row: 1. Lie on your back on a firm bed or the floor with your legs stretched out. 2. Bend your knees so they point up to the ceiling. Your feet should be flat on the floor. 3. Cross your arms over your chest. 4. Tip your chin a little bit toward your chest but do not bend your neck. 5. Tighten your belly muscles and slowly raise your chest just enough to lift your shoulder blades a tiny bit off of the floor. 6. Slowly lower your chest and your head to the floor. Back  Lifts Do these steps 5-10 times in a row: 1. Lie on your belly (face-down) with your arms at your sides, and rest your forehead on the floor. 2. Tighten the muscles in your legs and your butt. 3. Slowly lift your chest off of the floor while you keep your hips on the floor. Keep the back of your head in line with the curve in your back. Look at the floor while you do this. 4. Stay in this position for 3-5 seconds. 5. Slowly lower your chest and your face to the floor. GET HELP IF:  Your back pain gets a lot worse when you do an exercise.  Your back pain does not lessen 2 hours after you exercise. If you have any of these problems, stop doing the exercises. Do not do them again unless your doctor says it is okay. GET HELP RIGHT AWAY IF:  You have sudden, very bad back pain. If this happens, stop doing the exercises. Do not do them again unless your doctor says it is okay.   This information is not intended to replace advice given to you by your health care provider. Make sure you discuss any questions you have with your health care provider.   Document Released: 04/02/2010 Document Revised: 11/19/2014 Document Reviewed: 04/24/2014 Elsevier Interactive Patient Education [employer].

## 2015-07-03 ENCOUNTER — Other Ambulatory Visit: Payer: Self-pay | Admitting: Family Medicine

## 2016-02-01 ENCOUNTER — Other Ambulatory Visit: Payer: Self-pay | Admitting: Family Medicine

## 2016-02-26 ENCOUNTER — Ambulatory Visit: Payer: 59 | Admitting: Family Medicine

## 2016-02-26 ENCOUNTER — Telehealth: Payer: Self-pay | Admitting: Family Medicine

## 2016-02-26 NOTE — Telephone Encounter (Signed)
FYI - Pt called and stated that she has to work and didn't realize that.

## 2016-02-26 NOTE — Telephone Encounter (Signed)
noted 

## 2016-04-04 ENCOUNTER — Other Ambulatory Visit: Payer: Self-pay | Admitting: Family Medicine

## 2016-04-04 DIAGNOSIS — Z1231 Encounter for screening mammogram for malignant neoplasm of breast: Secondary | ICD-10-CM

## 2016-04-18 ENCOUNTER — Encounter: Payer: Self-pay | Admitting: Family Medicine

## 2016-04-18 ENCOUNTER — Ambulatory Visit (INDEPENDENT_AMBULATORY_CARE_PROVIDER_SITE_OTHER): Payer: 59 | Admitting: Family Medicine

## 2016-04-18 ENCOUNTER — Ambulatory Visit (INDEPENDENT_AMBULATORY_CARE_PROVIDER_SITE_OTHER): Payer: 59

## 2016-04-18 VITALS — BP 118/78 | HR 76 | Temp 98.6°F | Wt 152.0 lb

## 2016-04-18 DIAGNOSIS — I10 Essential (primary) hypertension: Secondary | ICD-10-CM | POA: Diagnosis not present

## 2016-04-18 DIAGNOSIS — R0609 Other forms of dyspnea: Secondary | ICD-10-CM

## 2016-04-18 DIAGNOSIS — R200 Anesthesia of skin: Secondary | ICD-10-CM

## 2016-04-18 DIAGNOSIS — R0600 Dyspnea, unspecified: Secondary | ICD-10-CM

## 2016-04-18 DIAGNOSIS — Z720 Tobacco use: Secondary | ICD-10-CM

## 2016-04-18 HISTORY — DX: Dyspnea, unspecified: R06.00

## 2016-04-18 HISTORY — DX: Other forms of dyspnea: R06.09

## 2016-04-18 LAB — COMPREHENSIVE METABOLIC PANEL
ALBUMIN: 4.3 g/dL (ref 3.5–5.2)
ALK PHOS: 76 U/L (ref 39–117)
ALT: 10 U/L (ref 0–35)
AST: 12 U/L (ref 0–37)
BILIRUBIN TOTAL: 0.5 mg/dL (ref 0.2–1.2)
BUN: 15 mg/dL (ref 6–23)
CHLORIDE: 109 meq/L (ref 96–112)
CO2: 29 mEq/L (ref 19–32)
Calcium: 9.4 mg/dL (ref 8.4–10.5)
Creatinine, Ser: 0.61 mg/dL (ref 0.40–1.20)
GFR: 131.68 mL/min (ref >60.00)
Glucose, Bld: 94 mg/dL (ref 70–99)
Potassium: 3.8 mEq/L (ref 3.5–5.1)
Sodium: 141 mEq/L (ref 135–145)
TOTAL PROTEIN: 7.3 g/dL (ref 6.0–8.3)

## 2016-04-18 LAB — TSH: TSH: 0.14 u[IU]/mL — AB (ref 0.35–4.50)

## 2016-04-18 LAB — CBC
HEMATOCRIT: 38.6 % (ref 36.0–46.0)
HEMOGLOBIN: 13 g/dL (ref 12.0–15.0)
MCHC: 33.7 g/dL (ref 30.0–36.0)
MCV: 88.1 fl (ref 78.0–100.0)
PLATELETS: 277 10*3/uL (ref 150.0–400.0)
RBC: 4.38 Mil/uL (ref 3.87–5.11)
RDW: 15.3 % (ref 11.5–15.5)
WBC: 7.3 10*3/uL (ref 4.0–10.5)

## 2016-04-18 NOTE — Progress Notes (Signed)
Tommi Rumps, MD Phone: [phone]  Alyssa Matthews is a 56 y.o. female who presents today for f/u.  HYPERTENSION  Disease Monitoring  Home BP Monitoring not checking Chest pain- no    Dyspnea- yes, see below Medications  Compliance-  Taking amlodipine.  Edema- no  Dyspnea on exertion: Patient notes over the last several months she has had shortness of breath when she walks up a flight of stairs. She gets quite short of breath at the top of the stairs. Notes no shortness of breath with walking on flat land. No PND or orthopnea. No chest pain. Does smoke. Does report some wheezing at night. Has felt somewhat congested in her chest over the last couple weeks with cough productive of dark mucus.  Patient additionally notes continued intermittent left leg numbness and tingling over the anterior aspect of her leg. She notes no posterior or lateral numbness or tingling. No back pain. Has been going on intermittently for a year. No weakness. No saddle anesthesia. No bowel or bladder incontinence. Typically occurs when she's lying down in bed or standing up at work.   PMH: Smoker   ROS see history of present illness  Objective  Physical Exam Vitals:   04/18/16 0843  BP: 118/78  Pulse: 76  Temp: 98.6 F (37 C)    BP Readings from Last 3 Encounters:  04/18/16 118/78  06/29/15 106/66  05/18/15 126/72   Wt Readings from Last 3 Encounters:  04/18/16 152 lb (68.9 kg)  06/29/15 148 lb 6.4 oz (67.3 kg)  05/18/15 146 lb 8 oz (66.5 kg)    Physical Exam  Constitutional: No distress.  HENT:  Head: Normocephalic and atraumatic.  Cardiovascular: Normal rate, regular rhythm and normal heart sounds.   Pulmonary/Chest: Effort normal and breath sounds normal.  Musculoskeletal: She exhibits no edema.  No midline spine tenderness, no midline spine step-off, no muscular back tenderness  Neurological: She is alert. Gait normal.  CN 2-12 intact, 5/5 strength in bilateral biceps, triceps,  grip, quads, hamstrings, plantar and dorsiflexion, sensation to light touch intact in bilateral UE and LE  Skin: Skin is warm and dry. She is not diaphoretic.   EKG: Normal sinus rhythm, nonspecific T-wave changes in V3, rate 67  Assessment/Plan: Please see individual problem list.  Essential hypertension At goal. Continue current medications.  Dyspnea on exertion New issue. Dyspnea on exertion could be related to a number of different things. She does have a nonspecific T-wave inversion on her EKG. We'll check some lab work as outlined below. We'll check a chest x-ray given her cough that is productive. We will likely refer to cardiology unless there is convincing evidence on lab work for a cause of dyspnea. She is given return precautions.  Bilateral numbness of feet Intermittent chronic issue though now more so in the left anterior leg. No back pain. She is neurologically intact at this time. There are no red flags for back pain. Previously was bilateral though now notes just anteriorly in the left leg. Suspect nerve impingement given localized to the anterior portion of her left leg. Discussed obtaining an x-ray of her lumbar spine versus starting with exercises to see if that'll be beneficial. She is provided with exercises to complete. If not improving over the next several weeks could consider x-ray of lumbar spine. Given return precautions.  Tobacco abuse Patient was given information on Wellbutrin and Chantix. Also discussed tobacco replacement. She is ready to quit though unsure which of these she would like  to try. She'll let us know when she has reviewed the information.   Orders Placed This Encounter  Procedures  . DG Chest 2 View    Standing Status:   Future    Number of Occurrences:   1    Standing Expiration Date:   06/16/2017    Order Specific Question:   Reason for Exam (SYMPTOM  OR DIAGNOSIS REQUIRED)    Answer:   cough, chest congestion, dyspnea on exertion    Order  Specific Question:   Is patient pregnant?    Answer:   No    Order Specific Question:   Preferred imaging location?    Answer:   [facility]  . CBC  . Comp Met (CMET)  . TSH  . EKG 12-Lead    Tommi Rumps, MD [place]

## 2016-04-18 NOTE — Patient Instructions (Addendum)
Nice to see you. We'll get some lab work today and if there is no cause for your breathing issues we will refer you to cardiology. We'll have to do some exercises for your low back. If this does not help with the intermittent numbness and tingling in your leg please let us know so we can do an x-ray.  If you develop persistent shortness of breath, or develop chest pain, persistent numbness or tingling, weakness, or any new or changing symptoms please seek medical attention immediately.   Back Exercises Introduction If you have pain in your back, do these exercises 2-3 times each day or as told by your doctor. When the pain goes away, do the exercises once each day, but repeat the steps more times for each exercise (do more repetitions). If you do not have pain in your back, do these exercises once each day or as told by your doctor. Exercises Single Knee to Chest  Do these steps 3-5 times in a row for each leg: 1. Lie on your back on a firm bed or the floor with your legs stretched out. 2. Bring one knee to your chest. 3. Hold your knee to your chest by grabbing your knee or thigh. 4. Pull on your knee until you feel a gentle stretch in your lower back. 5. Keep doing the stretch for 10-30 seconds. 6. Slowly let go of your leg and straighten it. Pelvic Tilt  Do these steps 5-10 times in a row: 1. Lie on your back on a firm bed or the floor with your legs stretched out. 2. Bend your knees so they point up to the ceiling. Your feet should be flat on the floor. 3. Tighten your lower belly (abdomen) muscles to press your lower back against the floor. This will make your tailbone point up to the ceiling instead of pointing down to your feet or the floor. 4. Stay in this position for 5-10 seconds while you gently tighten your muscles and breathe evenly. Cat-Cow  Do these steps until your lower back bends more easily: 1. Get on your hands and knees on a firm surface. Keep your hands under your  shoulders, and keep your knees under your hips. You may put padding under your knees. 2. Let your head hang down, and make your tailbone point down to the floor so your lower back is round like the back of a cat. 3. Stay in this position for 5 seconds. 4. Slowly lift your head and make your tailbone point up to the ceiling so your back hangs low (sags) like the back of a cow. 5. Stay in this position for 5 seconds. Press-Ups  Do these steps 5-10 times in a row: 1. Lie on your belly (face-down) on the floor. 2. Place your hands near your head, about shoulder-width apart. 3. While you keep your back relaxed and keep your hips on the floor, slowly straighten your arms to raise the top half of your body and lift your shoulders. Do not use your back muscles. To make yourself more comfortable, you may change where you place your hands. 4. Stay in this position for 5 seconds. 5. Slowly return to lying flat on the floor. Bridges  Do these steps 10 times in a row: 1. Lie on your back on a firm surface. 2. Bend your knees so they point up to the ceiling. Your feet should be flat on the floor. 3. Tighten your butt muscles and lift your butt off of  the floor until your waist is almost as high as your knees. If you do not feel the muscles working in your butt and the back of your thighs, slide your feet 1-2 inches farther away from your butt. 4. Stay in this position for 3-5 seconds. 5. Slowly lower your butt to the floor, and let your butt muscles relax. If this exercise is too easy, try doing it with your arms crossed over your chest. Belly Crunches  Do these steps 5-10 times in a row: 1. Lie on your back on a firm bed or the floor with your legs stretched out. 2. Bend your knees so they point up to the ceiling. Your feet should be flat on the floor. 3. Cross your arms over your chest. 4. Tip your chin a little bit toward your chest but do not bend your neck. 5. Tighten your belly muscles and slowly  raise your chest just enough to lift your shoulder blades a tiny bit off of the floor. 6. Slowly lower your chest and your head to the floor. Back Lifts  Do these steps 5-10 times in a row: 1. Lie on your belly (face-down) with your arms at your sides, and rest your forehead on the floor. 2. Tighten the muscles in your legs and your butt. 3. Slowly lift your chest off of the floor while you keep your hips on the floor. Keep the back of your head in line with the curve in your back. Look at the floor while you do this. 4. Stay in this position for 3-5 seconds. 5. Slowly lower your chest and your face to the floor. Contact a doctor if:  Your back pain gets a lot worse when you do an exercise.  Your back pain does not lessen 2 hours after you exercise. If you have any of these problems, stop doing the exercises. Do not do them again unless your doctor says it is okay. Get help right away if:  You have sudden, very bad back pain. If this happens, stop doing the exercises. Do not do them again unless your doctor says it is okay. This information is not intended to replace advice given to you by your health care provider. Make sure you discuss any questions you have with your health care provider. Document Released: 04/02/2010 Document Revised: 08/06/2015 Document Reviewed: 04/24/2014  2017 Elsevier

## 2016-04-18 NOTE — Assessment & Plan Note (Signed)
At goal. Continue current medications. 

## 2016-04-18 NOTE — Assessment & Plan Note (Signed)
Patient was given information on Wellbutrin and Chantix. Also discussed tobacco replacement. She is ready to quit though unsure which of these she would like to try. She'll let us know when she has reviewed the information.

## 2016-04-18 NOTE — Progress Notes (Signed)
Pre visit review using our clinic review tool, if applicable. No additional management support is needed unless otherwise documented below in the visit note. 

## 2016-04-18 NOTE — Assessment & Plan Note (Signed)
New issue. Dyspnea on exertion could be related to a number of different things. She does have a nonspecific T-wave inversion on her EKG. We'll check some lab work as outlined below. We'll check a chest x-ray given her cough that is productive. We will likely refer to cardiology unless there is convincing evidence on lab work for a cause of dyspnea. She is given return precautions.

## 2016-04-18 NOTE — Assessment & Plan Note (Addendum)
Intermittent chronic issue though now more so in the left anterior leg. No back pain. She is neurologically intact at this time. There are no red flags for back pain. Previously was bilateral though now notes just anteriorly in the left leg. Suspect nerve impingement given localized to the anterior portion of her left leg. Discussed obtaining an x-ray of her lumbar spine versus starting with exercises to see if that'll be beneficial. She is provided with exercises to complete. If not improving over the next several weeks could consider x-ray of lumbar spine. Given return precautions.

## 2016-04-20 ENCOUNTER — Other Ambulatory Visit: Payer: Self-pay | Admitting: Family Medicine

## 2016-04-20 DIAGNOSIS — R7989 Other specified abnormal findings of blood chemistry: Secondary | ICD-10-CM

## 2016-04-22 ENCOUNTER — Other Ambulatory Visit (INDEPENDENT_AMBULATORY_CARE_PROVIDER_SITE_OTHER): Payer: 59

## 2016-04-22 DIAGNOSIS — R946 Abnormal results of thyroid function studies: Secondary | ICD-10-CM | POA: Diagnosis not present

## 2016-04-22 DIAGNOSIS — R7989 Other specified abnormal findings of blood chemistry: Secondary | ICD-10-CM

## 2016-04-22 LAB — T3, FREE: T3 FREE: 4.6 pg/mL — AB (ref 2.3–4.2)

## 2016-04-22 LAB — T4, FREE: FREE T4: 0.8 ng/dL (ref 0.60–1.60)

## 2016-04-25 ENCOUNTER — Other Ambulatory Visit: Payer: Self-pay | Admitting: Family Medicine

## 2016-04-25 ENCOUNTER — Telehealth: Payer: Self-pay

## 2016-04-25 DIAGNOSIS — E0590 Thyrotoxicosis, unspecified without thyrotoxic crisis or storm: Secondary | ICD-10-CM

## 2016-04-25 MED ORDER — AMLODIPINE BESYLATE 10 MG PO TABS: ORAL_TABLET | ORAL | 1 refills | Status: DC

## 2016-04-25 NOTE — Telephone Encounter (Signed)
Patient requested refill, rx sent to pharmacy

## 2016-04-29 ENCOUNTER — Other Ambulatory Visit: Payer: Self-pay | Admitting: Family Medicine

## 2016-05-02 ENCOUNTER — Ambulatory Visit
Admission: RE | Admit: 2016-05-02 | Discharge: 2016-05-02 | Disposition: A | Discharge status: Home / Self Care | Payer: 59 | Source: Ambulatory Visit | Attending: Family Medicine | Admitting: Family Medicine

## 2016-05-02 DIAGNOSIS — Z1231 Encounter for screening mammogram for malignant neoplasm of breast: Secondary | ICD-10-CM | POA: Diagnosis present

## 2016-05-26 ENCOUNTER — Encounter: Payer: Self-pay | Admitting: Endocrinology

## 2016-05-26 ENCOUNTER — Ambulatory Visit (INDEPENDENT_AMBULATORY_CARE_PROVIDER_SITE_OTHER): Payer: 59 | Admitting: Endocrinology

## 2016-05-26 VITALS — BP 126/80 | HR 73 | Ht 63.0 in | Wt 151.0 lb

## 2016-05-26 DIAGNOSIS — E0590 Thyrotoxicosis, unspecified without thyrotoxic crisis or storm: Secondary | ICD-10-CM

## 2016-05-26 LAB — T3, FREE: T3 FREE: 4.1 pg/mL (ref 2.3–4.2)

## 2016-05-26 NOTE — Patient Instructions (Signed)
What is hyperthyroidism?  Hyperthyroidism develops when the body is exposed to excessive amounts of thyroid hormone. This disorder occurs in almost one percent of all Americans and affects women five to 10 times more often than men. In its mildest form, hyperthyroidism may not cause recognizable symptoms. More often, however, the symptoms are discomforting, disabling or even life-threatening.  What are the causes of hyperthyroidism?  Berenice Primas' disease: Graves' disease (named after [place] physician Raylene Everts) is an autoimmune disorder that frequently results in thyroid enlargement and hyperthyroidism. In some patients, swelling of the muscles and other tissues around the eyes may develop, causing eye prominence, discomfort or double vision. Like other autoimmune diseases, this condition tends to affect multiple family members. It is much more common in women than in men and tends to occur in younger patients.  . Toxic multinodular goiter: Multiple nodules in the thyroid can produce excessive thyroid hormone, causing hyperthyroidism. Typically diagnosed in patients over the age of 46, this disorder is more likely to affect heart rhythm. In many cases, the person has had the goiter for many years before it becomes overactive. . Toxic nodule: A single nodule or lump in the thyroid can also produce more thyroid hormone than the body requires and lead to hyperthyroidism. This disorder is not familial. . Excessive iodine ingestion: Various sources of high iodine concentrations, such as kelp tablets, some expectorants, amiodarone (Cordarone, Pacerone - medications used to treat certain problems with heart rhythms) and x-ray dyes may occasionally cause hyperthyroidism in patients who are prone to it. .    What are the signs and symptoms of hyperthyroidism? When hyperthyroidism develops, a goiter (enlargement of the thyroid) is usually present and may be associated with some or many of the following  features: . Fast heart rate, often more than 100 beats per minute . Becoming anxious, irritable, argumentative . Trembling hands . Weight loss, despite eating the same amount or even more than usual . Intolerance of warm temperatures and increased likelihood to perspire . Loss of scalp hair . Tendency of fingernails to separate from the nail bed . Muscle weakness, especially of the upper arms and thighs . Loose and frequent bowel movements . Smooth skin . Change in menstrual pattern . Increased likelihood for miscarriage . Prominent "stare" of the eyes . Protrusion of the eyes, with or without double vision (in patients with Graves' disease) . Irregular heart rhythm, especially in patients older than 54 years of age . Accelerated loss of calcium from bones, which increases the risk of osteoporosis and fractures   How is hyperthyroidism diagnosed? Sometimes a general physician can diagnose and treat the cause of hyperthyroidism, but assistance is often needed from an endocrinologist, a physician who specializes in managing thyroid disease. Characteristic symptoms and physical signs of the disease can be detected by a trained physician. In addition, tests can be used to confirm the diagnosis and to determine the cause.  Tests TSH (THYROID-STIMULATING HORMONE OR THYROTROPIN): A low TSH level in the blood is the most accurate indicator of hyperthyroidism. The body shuts off production of this pituitary hormone when the thyroid gland even slightly overproduces thyroid hormone. If the TSH level is low, it is very important to also check thyroid hormone levels to confirm the diagnosis of hyperthyroidism.  ESTIMATES OF FREE THYROXINE AND FREE TRIIODOTHYRONINE: When hyperthyroidism develops, free thyroxine and free triiodothyronine levels rise above previous values in that specific patient (although they may still fall within the normal range for the general population)  and are often considerably  elevated. TSI (THYROID-STIMULATING IMMUNOGLOBULIN): A substance often found in the blood when Graves' disease is the cause of hyperthyroidism. RADIOACTIVE IODINE UPTAKE (RAIU): The amount of iodine the thyroid gland can collect, and a thyroid scan, which shows how the iodine is distributed throughout the thyroid gland.  THYROID SCAN: This information can be useful in determining the cause of hyperthyroidism and, ultimately, its treatment.   How is hyperthyroidism treated? Appropriate management of hyperthyroidism requires careful evaluation and ongoing care by a physician experienced in the treatment of this complex condition. Before the development of current treatment options, the death rate from severe hyperthyroidism was as high as 50 percent. Now several effective treatments are available and, with proper management, death from hyperthyroidism is rare. Deciding which treatment is best depends on what caused the hyperthyroidism, its severity and other conditions present.   . Antithyroid Drugs In the [place], two drugs are available for treating hyperthyroidism: propylthiouracil (PTU) and methimazole (Tapazole). Except for early pregnancy, methimazole is preferred because PTU can cause fatal liver damage, although rarely. These medications control hyperthyroidism by slowing thyroid hormone production. They may take several months to normalize thyroid hormone levels. Some patients with hyperthyroidism caused by Graves' disease experience a spontaneous or natural remission of hyperthyroidism after a 12- to 10-month course of treatment with these drugs and may sometimes avoid permanent underactivity of the thyroid (hypothyroidism), which often occurs as a result of using the other methods of treating hyperthyroidism. Unfortunately, the remission is frequently only temporary, with the hyperthyroidism recurring after several months or years off medication and requiring additional treatment, so  relatively few patients are treated solely with antithyroid medication in the [place]. Antithyroid drugs may cause an allergic reaction in about five percent of patients who use them. This usually occurs during the first six weeks of drug treatment. Such a reaction may include rash or hives; but after discontinuing use of the drug, the symptoms resolve within one to two weeks and there is no permanent damage.  A more serious side effect, but occurring in only about one in 250-500 patients during the first four to eight weeks of treatment, is a rapid decrease of white blood cells in the bloodstream. This could increase susceptibility to serious infection. Symptoms such as a sore throat, infection or fever should be reported promptly to your physician, and a white blood cell count should be done immediately. In nearly every case, when a person stops using the medication, the white blood cell count returns to normal. Very rarely, antithyroid drugs may cause severe liver problems, which can be detected by monitoring blood tests or joint problems characterized by joint pain and/or swelling. Your physician should be contacted if there is yellowing of the skin (jaundice), fever, loss of appetite or abdominal pain.  . Radioactive Iodine Treatment Iodine is an essential ingredient in the production of thyroid hormone. Each molecule of thyroid hormone contains either four (T4) or three (T3) molecules of iodine. Since most overactive thyroid glands are quite hungry for iodine, it was discovered in the 1940s that the thyroid could be "tricked" into destroying itself by simply feeding it radioactive iodine. The radioactive iodine is given by mouth, usually in capsule form, and is quickly absorbed from the bowel. It then enters the thyroid cells from the bloodstream and gradually destroys them. Maximal benefit is usually noted within three to six months. It is not possible to eliminate "just the right amount" of the  diseased thyroid gland,  since radioiodine eventually damages all thyroid cells. Therefore, most endocrinologists strive to completely destroy the diseased thyroid gland with a single dose of radioiodine. This results in the intentional development of an underactive thyroid state (hypothyroidism), which is easily, predictably and inexpensively corrected by lifelong daily use of oral thyroid hormone replacement therapy. Although every effort is made to calculate the correct dose of radioiodine for each patient, not every treatment will successfully correct the hyperthyroidism, particularly if the goiter is quite large and a second dose of radioactive iodine is occasionally needed. Thousands of patients have received radioiodine treatment, including former [employer] engineer of the [employer] and his wife, Pamala Hurry. The treatment is a very safe, simple and reliably effective one. Because of this, it is considered by most thyroid specialists in the [place] to be the treatment of choice for hyperthyroidism cases caused by overproduction of thyroid hormone.

## 2016-05-26 NOTE — Progress Notes (Signed)
Patient ID: Alyssa Matthews, female   DOB: March 30, 1960, 55 y.o.   MRN: [id number]                                                                                                               Reason for Appointment:  Hyperthyroidism, new consultation  Referring physician: Caryl Bis  Chief complaint: Evaluation of thyroid   History of Present Illness:   She was seen by her PCP for general physical exam and had TSH done as a screening test Although the patient says that she feels fine and has no physical symptoms she was reporting that she was getting out of breath on climbing stairs to her PCP She does not know when these symptoms started but she blames it on her smoking  Also for an uncertain period of time occasionally she feels her heart beating fast but mostly when she is nervous or tense She has not had any weight loss She does not think she has any shakiness, feeling excessively warm and sweaty, nervousness, and fatigue.  She tends to feel hot and cold alternately and this has been going on for some time   Wt Readings from Last 3 Encounters:  05/26/16 151 lb (68.5 kg)  04/18/16 152 lb (68.9 kg)  06/29/15 148 lb 6.4 oz (67.3 kg)      Thyroid function tests as follows:     Lab Results  Component Value Date   FREET4 0.80 04/22/2016   FREET4 0.78 01/01/2015   T3FREE 4.6 (H) 04/22/2016   T3FREE 3.4 01/01/2015   TSH 0.14 (L) 04/18/2016   TSH 0.30 (L) 01/01/2015    No results found for: THYROTRECAB   Allergies as of 05/26/2016   No Known Allergies     Medication List       Accurate as of 05/26/16 11:20 AM. Always use your most recent med list.          amLODipine 10 MG tablet Commonly known as:  NORVASC TAKE 1 TABLET (10 MG TOTAL) BY MOUTH DAILY.           Past Medical History:  Diagnosis Date  . History of blood transfusion   . Hypercholesteremia   . Hypertension   . Wears dentures    partial upper    Past Surgical History:  Procedure Laterality  Date  . BREAST CYST ASPIRATION  2015  . COLONOSCOPY WITH PROPOFOL N/A 03/30/2015   Procedure: COLONOSCOPY WITH PROPOFOL;  Surgeon: Lucilla Lame, MD;  Location: [facility];  Service: Endoscopy;  Laterality: N/A;  . DILATION AND CURETTAGE OF UTERUS    . POLYPECTOMY  03/30/2015   Procedure: POLYPECTOMY;  Surgeon: Lucilla Lame, MD;  Location: [facility];  Service: Endoscopy;;    Family History  Problem Relation Age of Onset  . Hypertension Mother   . Kidney disease Brother   . Breast cancer Neg Hx     Social History:  reports that she has been smoking.  She has a 15.00 pack-year smoking history. She has never used  smokeless tobacco. She reports that she drinks about 0.6 oz of alcohol per week . She reports that she does not use drugs.  Allergies: No Known Allergies  Review of Systems:  Review of Systems  Constitutional: Negative for weight loss and reduced appetite.  HENT: Negative for trouble swallowing.   Eyes: Negative for visual disturbance.  Respiratory: Positive for shortness of breath.   Cardiovascular: Positive for palpitations. Negative for leg swelling.  Gastrointestinal: Negative for diarrhea and abdominal pain.  Endocrine:       She has been in menopause for a few years, occasionally feels hot and cold  Musculoskeletal: Negative for joint pain.  Skin: Negative for rash.  Neurological: Positive for numbness.       Left leg  Psychiatric/Behavioral: Negative for insomnia.      Examination:   BP 126/80   Pulse 73   Ht 5\' 3"  (1.6 m)   Wt 151 lb (68.5 kg)   SpO2 98%   BMI 26.75 kg/m    General Appearance:  well-built and nourished, pleasant, not anxious or hyperkinetic.        Eyes: No unusual prominence, lid lag or stare. No swelling of the eyelids  Neck: The thyroid is enlarged on the left side about 3 times normal, smooth, non-tender and slightly firm, extending to the isthmus.  Right lobe is not palpable  There is no lymphadenopathy .            Heart: normal S1 and S2, no murmurs .          Lungs: breath sounds are clear bilaterally Abdomen: no hepatosplenomegaly or other palpable abnormality   Extremities: hands are warm but not diaphoretic. No ankle edema. Neurological: Deep tendon reflexes at biceps are normal No tremor present  Skin: No rash, abnormal thickening of the skin on legs or pigmentation seen     Assessment/Plan:   Hyperthyroidism, mild and associated with significant left-sided thyroid enlargement  She has minimal symptoms currently and only in the form of occasional palpitations and dyspnea on exertion Apart from her goiter her exam is unremarkable She has only a high free T3 level currently along with low TSH Her TSH was low normal in 2016 also  She may either have toxic nodular on the left side or Graves' disease with asymmetrical goiter Discussed causation of hyperthyroidism and both the conditions that we are considering Also discussed potential treatments for either condition However would like to recheck her free T3 to make sure it is consistently high Thyrotropin receptor antibody to be checked today  Patient handout on hyperthyroidism and radioactive iodine treatment given Patient understands the above discussion and treatment options. All questions were answered satisfactorily  Consult note sent to referring physician  [facility] 05/26/2016, 11:20 AM

## 2016-05-27 LAB — THYROTROPIN RECEPTOR AUTOABS: Thyrotropin Receptor Ab: 0.62 IU/L (ref 0.00–1.75)

## 2016-06-28 ENCOUNTER — Ambulatory Visit (INDEPENDENT_AMBULATORY_CARE_PROVIDER_SITE_OTHER): Payer: 59 | Admitting: Family Medicine

## 2016-06-28 ENCOUNTER — Encounter: Payer: Self-pay | Admitting: Family Medicine

## 2016-06-28 DIAGNOSIS — R946 Abnormal results of thyroid function studies: Secondary | ICD-10-CM | POA: Diagnosis not present

## 2016-06-28 DIAGNOSIS — E0590 Thyrotoxicosis, unspecified without thyrotoxic crisis or storm: Secondary | ICD-10-CM

## 2016-06-28 DIAGNOSIS — R0600 Dyspnea, unspecified: Secondary | ICD-10-CM

## 2016-06-28 DIAGNOSIS — R0609 Other forms of dyspnea: Secondary | ICD-10-CM

## 2016-06-28 DIAGNOSIS — S20212A Contusion of left front wall of thorax, initial encounter: Secondary | ICD-10-CM

## 2016-06-28 DIAGNOSIS — R7989 Other specified abnormal findings of blood chemistry: Secondary | ICD-10-CM

## 2016-06-28 HISTORY — DX: Thyrotoxicosis, unspecified without thyrotoxic crisis or storm: E05.90

## 2016-06-28 HISTORY — DX: Contusion of left front wall of thorax, initial encounter: S20.212A

## 2016-06-28 NOTE — Assessment & Plan Note (Signed)
This is minimally an issue for her. Does occasionally have symptoms. Could be related to her thyroid dysfunction. She does smoke. We will have her complete the evaluation with endocrinology given possible hyperthyroidism. If evaluation is unremarkable could consider cardiology referral.

## 2016-06-28 NOTE — Assessment & Plan Note (Signed)
Patient with low TSH and elevated free T4. Recheck a free T4 was in the normal range. Thyrotropin receptor antibodies in the normal range as well. We will send a message to her endocrinologist to see if she needs repeat lab work and to see what the next step in evaluation is.

## 2016-06-28 NOTE — Progress Notes (Signed)
  Tommi Rumps, MD Phone: [phone]  Alyssa Matthews is a 56 y.o. female who presents today for same day visit.  Patient reports yesterday she was in [place] and was walking to a [employer] and one of her feet got stuck and she fell over and landed on her left lower ribs. She notes no head injury or loss of consciousness. She notes some discomfort in her ribs since then. No shortness of breath. Some cough. No fevers. Cough is not productive. Motrin has been beneficial.  Patient was sent to endocrinology for concern for hyper thyroidism. Her TSH had been low and her free T4 has been elevated. She notes no issues with recent palpitations. Her breathing is stable. She reports the endocrinologist advised that both of these symptoms could be related to her thyroid dysfunction. She reports she was unsure when she needed labs again. She is not on medication for this.  ROS see history of present illness  Objective  Physical Exam Vitals:   06/28/16 1117  BP: 128/70  Pulse: 82  Temp: 98.3 F (36.8 C)    BP Readings from Last 3 Encounters:  06/28/16 128/70  05/26/16 126/80  04/18/16 118/78   Wt Readings from Last 3 Encounters:  06/28/16 151 lb 6.4 oz (68.7 kg)  05/26/16 151 lb (68.5 kg)  04/18/16 152 lb (68.9 kg)    Physical Exam  Constitutional: No distress.  Cardiovascular: Normal rate, regular rhythm and normal heart sounds.   Pulmonary/Chest: Effort normal and breath sounds normal. No respiratory distress. She has no wheezes. She has no rales.  Left lower anterior ribs with tenderness, no palpable bony defects, no flail chest noted, no bruising  Abdominal: Soft. She exhibits no distension. There is no tenderness (no upper abdominal ).  Neurological: She is alert. Gait normal.  Skin: Skin is warm and dry. She is not diaphoretic.     Assessment/Plan: Please see individual problem list.  Rib contusion, left, initial encounter Suspect rib contusion and soft tissue injury.  Did discuss obtaining x-rays to rule out fracture though she declined this today and opted to continue to monitor. She has good lung sounds and no evidence of flail chest. No bony defects noted. She will monitor. She'll continue Motrin as needed. She is given return precautions.  Dyspnea on exertion This is minimally an issue for her. Does occasionally have symptoms. Could be related to her thyroid dysfunction. She does smoke. We will have her complete the evaluation with endocrinology given possible hyperthyroidism. If evaluation is unremarkable could consider cardiology referral.  Low TSH level Patient with low TSH and elevated free T4. Recheck a free T4 was in the normal range. Thyrotropin receptor antibodies in the normal range as well. We will send a message to her endocrinologist to see if she needs repeat lab work and to see what the next step in evaluation is.   Tommi Rumps, MD [place]

## 2016-06-28 NOTE — Progress Notes (Signed)
Pre visit review using our clinic review tool, if applicable. No additional management support is needed unless otherwise documented below in the visit note. 

## 2016-06-28 NOTE — Assessment & Plan Note (Signed)
Suspect rib contusion and soft tissue injury. Did discuss obtaining x-rays to rule out fracture though she declined this today and opted to continue to monitor. She has good lung sounds and no evidence of flail chest. No bony defects noted. She will monitor. She'll continue Motrin as needed. She is given return precautions.

## 2016-06-28 NOTE — Patient Instructions (Signed)
Nice to see you. Please monitor your left lower ribs. If you develop worsening pain or you develop cough productive of blood, fevers, shortness of breath, or new symptoms please seek medical attention. I will send a message to your endocrinologist to see when they wanted lab work repeated.

## 2016-06-30 ENCOUNTER — Ambulatory Visit: Payer: 59 | Admitting: Endocrinology

## 2016-07-18 ENCOUNTER — Ambulatory Visit (INDEPENDENT_AMBULATORY_CARE_PROVIDER_SITE_OTHER): Payer: 59 | Admitting: Endocrinology

## 2016-07-18 ENCOUNTER — Encounter: Payer: Self-pay | Admitting: Endocrinology

## 2016-07-18 VITALS — BP 130/86 | HR 74 | Ht 63.0 in | Wt 149.4 lb

## 2016-07-18 DIAGNOSIS — E042 Nontoxic multinodular goiter: Secondary | ICD-10-CM

## 2016-07-18 DIAGNOSIS — E0590 Thyrotoxicosis, unspecified without thyrotoxic crisis or storm: Secondary | ICD-10-CM | POA: Diagnosis not present

## 2016-07-18 LAB — T4, FREE: FREE T4: 0.72 ng/dL (ref 0.60–1.60)

## 2016-07-18 LAB — TSH: TSH: 0.07 u[IU]/mL — AB (ref 0.35–4.50)

## 2016-07-18 LAB — T3, FREE: T3 FREE: 3.6 pg/mL (ref 2.3–4.2)

## 2016-07-18 NOTE — Progress Notes (Signed)
Patient ID: Alyssa Matthews, female   DOB: 05-Feb-1961, 56 y.o.   MRN: [id number]                                                                                                               Reason for Appointment:  Hyperthyroidism, follow-up   Referring physician: Caryl Matthews  Chief complaint: Follow-up of thyroid   History of Present Illness:   Background history obtained on the initial consultation: She was seen by her PCP for general physical exam and had TSH done as a screening test Although the patient says that she feels fine and has no physical symptoms she was reporting that she was getting out of breath on climbing stairs to her PCP Also for an uncertain period of time occasionally she feels her heart beating fast but mostly when she is nervous or tense She has not had any weight loss She does not think she has any shakiness, feeling excessively warm and sweaty, nervousness, and fatigue.  She tends to feel hot and cold alternately and this has been going on for some time  RECENT history:  She was found to have possible toxic nodular goiter on her initial exam Although her free T3 was high in 2/18 he was back to upper normal range when she was first seen in 3/18 Free T4 has been consistently normal   Wt Readings from Last 3 Encounters:  07/18/16 149 lb 6.4 oz (67.8 kg)  06/28/16 151 lb 6.4 oz (68.7 kg)  05/26/16 151 lb (68.5 kg)      Thyroid function tests as follows:     Lab Results  Component Value Date   FREET4 0.80 04/22/2016   FREET4 0.78 01/01/2015   T3FREE 4.1 05/26/2016   T3FREE 4.6 (H) 04/22/2016   T3FREE 3.4 01/01/2015   TSH 0.14 (L) 04/18/2016   TSH 0.30 (L) 01/01/2015    Lab Results  Component Value Date   THYROTRECAB 0.62 05/26/2016     Allergies as of 07/18/2016   No Known Allergies     Medication List       Accurate as of 07/18/16  9:02 AM. Always use your most recent med list.          amLODipine 10 MG tablet Commonly known as:   NORVASC TAKE 1 TABLET (10 MG TOTAL) BY MOUTH DAILY.           Past Medical History:  Diagnosis Date  . History of blood transfusion   . Hypercholesteremia   . Hypertension   . Wears dentures    partial upper    Past Surgical History:  Procedure Laterality Date  . BREAST CYST ASPIRATION  2015  . COLONOSCOPY WITH PROPOFOL N/A 03/30/2015   Procedure: COLONOSCOPY WITH PROPOFOL;  Surgeon: Alyssa Lame, MD;  Location: [facility];  Service: Endoscopy;  Laterality: N/A;  . DILATION AND CURETTAGE OF UTERUS    . POLYPECTOMY  03/30/2015   Procedure: POLYPECTOMY;  Surgeon: Alyssa Lame, MD;  Location: [place];  Service: Endoscopy;;  Family History  Problem Relation Age of Onset  . Hypertension Mother   . Kidney disease Brother   . Cancer Father     In neck  . Drug abuse Paternal Grandmother   . Breast cancer Neg Hx   . Thyroid disease Neg Hx     Social History:  reports that she has been smoking.  She has a 15.00 pack-year smoking history. She has never used smokeless tobacco. She reports that she drinks about 0.6 oz of alcohol per week . She reports that she does not use drugs.  Allergies: No Known Allergies   Review of Systems      Examination:   BP 130/86   Pulse 74   Ht 5\' 3"  (1.6 m)   Wt 149 lb 6.4 oz (67.8 kg)   SpO2 98%   BMI 26.47 kg/m   The thyroid is enlarged on the left side about 3 times normal, smooth and firm, extending to the isthmus and slightly to the right medial lobe.  Right lobe laterally is not palpable  Extremities: hands are warm  Biceps reflexes appear normal    Assessment/Plan:  MULTINODULAR goiter with relatively large left side enlargement She has had subclinical hyperthyroidism although last free T3 level was only upper normal She is not symptomatic currently as before  Will need to reassess her thyroid levels to decide if she is still hyperthyroid or not If her TSH is back to normal May consider thyroid ultrasound  otherwise will do a thyroid scan to evaluate function of the left lobe which appears to have a dominant nodule  Further management will be decided based on above studies  Alyssa Matthews 07/18/2016, 9:02 AM    Addendum: TSH low but free T3 normal, will order a thyroid scan

## 2016-09-27 ENCOUNTER — Ambulatory Visit: Payer: 59 | Admitting: Family Medicine

## 2016-10-05 ENCOUNTER — Ambulatory Visit: Payer: 59 | Admitting: Family Medicine

## 2016-10-15 ENCOUNTER — Other Ambulatory Visit: Payer: Self-pay | Admitting: Family Medicine

## 2016-10-31 ENCOUNTER — Ambulatory Visit (INDEPENDENT_AMBULATORY_CARE_PROVIDER_SITE_OTHER): Payer: 59 | Admitting: Family Medicine

## 2016-10-31 ENCOUNTER — Encounter: Payer: Self-pay | Admitting: Family Medicine

## 2016-10-31 VITALS — BP 112/70 | HR 82 | Temp 98.4°F | Wt 143.2 lb

## 2016-10-31 DIAGNOSIS — R0609 Other forms of dyspnea: Secondary | ICD-10-CM

## 2016-10-31 DIAGNOSIS — E0590 Thyrotoxicosis, unspecified without thyrotoxic crisis or storm: Secondary | ICD-10-CM | POA: Diagnosis not present

## 2016-10-31 DIAGNOSIS — R197 Diarrhea, unspecified: Secondary | ICD-10-CM | POA: Diagnosis not present

## 2016-10-31 DIAGNOSIS — R634 Abnormal weight loss: Secondary | ICD-10-CM | POA: Diagnosis not present

## 2016-10-31 DIAGNOSIS — N9089 Other specified noninflammatory disorders of vulva and perineum: Secondary | ICD-10-CM

## 2016-10-31 DIAGNOSIS — R0600 Dyspnea, unspecified: Secondary | ICD-10-CM

## 2016-10-31 DIAGNOSIS — I10 Essential (primary) hypertension: Secondary | ICD-10-CM

## 2016-10-31 HISTORY — DX: Other specified noninflammatory disorders of vulva and perineum: N90.89

## 2016-10-31 HISTORY — DX: Abnormal weight loss: R63.4

## 2016-10-31 LAB — CBC
HCT: 40.2 % (ref 36.0–46.0)
Hemoglobin: 13.5 g/dL (ref 12.0–15.0)
MCHC: 33.6 g/dL (ref 30.0–36.0)
MCV: 90.1 fl (ref 78.0–100.0)
PLATELETS: 272 10*3/uL (ref 150.0–400.0)
RBC: 4.46 Mil/uL (ref 3.87–5.11)
RDW: 14.9 % (ref 11.5–15.5)
WBC: 6.7 10*3/uL (ref 4.0–10.5)

## 2016-10-31 LAB — SEDIMENTATION RATE: SED RATE: 15 mm/h (ref 0–30)

## 2016-10-31 LAB — COMPREHENSIVE METABOLIC PANEL
ALT: 11 U/L (ref 0–35)
AST: 15 U/L (ref 0–37)
Albumin: 3.9 g/dL (ref 3.5–5.2)
Alkaline Phosphatase: 69 U/L (ref 39–117)
BILIRUBIN TOTAL: 0.4 mg/dL (ref 0.2–1.2)
BUN: 13 mg/dL (ref 6–23)
CALCIUM: 9.3 mg/dL (ref 8.4–10.5)
CO2: 28 meq/L (ref 19–32)
Chloride: 106 mEq/L (ref 96–112)
Creatinine, Ser: 0.63 mg/dL (ref 0.40–1.20)
GFR: 126.61 mL/min (ref >60.00)
Glucose, Bld: 91 mg/dL (ref 70–99)
Potassium: 3.7 mEq/L (ref 3.5–5.1)
Sodium: 141 mEq/L (ref 135–145)
Total Protein: 7.3 g/dL (ref 6.0–8.3)

## 2016-10-31 NOTE — Assessment & Plan Note (Signed)
Nodule noted in right mid labia. No signs of infection. Otherwise benign exam. We will refer to gynecology for evaluation.

## 2016-10-31 NOTE — Assessment & Plan Note (Signed)
Continues to have some mild issues with this. Potentially could be her thyroid though she does have risk factors for cardiac issues. EKG previously done. Given persistence and risk factors will refer to cardiology to consider evaluation.

## 2016-10-31 NOTE — Patient Instructions (Signed)
Nice to see you. We'll get some lab work today and contact you you with the results. We will get you referred to cardiology. We will get you referred to gynecology as well.  If you develop blood in your stool, abdominal pain, worsening breathing issues, or chest pain please seek medical attention.

## 2016-10-31 NOTE — Progress Notes (Signed)
Tommi Rumps, MD Phone: [phone]  Alyssa Matthews is a 56 y.o. female who presents today for follow-up.  Hypertension: Not checking at home. Taking amlodipine. No chest pain or edema. Notes some dyspnea that has improved quite a bit. Does get somewhat short of breath caring groceries and going upstairs though not at any other time.  She smokes less than a pack a day for the past 30 years.  She reports intermittent recurrent nodules near her vagina. They are hard at times. Sometimes they drain some pus. They are not usually painful. Occasionally they'll itch. Notes they will come on and then resolve and then come back.  Patient notes she's lost 6 or 7 pounds recently. She's not made any dietary or exercise changes. Occasionally she just doesn't have much of an appetite. She does not note any early satiety or night sweats. No itching. She does have subclinical hyperthyroidism. She also reports she's had diarrhea over the last month. Typically 1 or 2 bowel movements daily with this. No blood in her stool. No abdominal pain. No nausea or vomiting. Up-to-date on colonoscopy, Pap smear, and mammogram.  PMH: Smoker   ROS see history of present illness  Objective  Physical Exam Vitals:   10/31/16 0839  BP: 112/70  Pulse: 82  Temp: 98.4 F (36.9 C)  SpO2: 98%    BP Readings from Last 3 Encounters:  10/31/16 112/70  07/18/16 130/86  06/28/16 128/70   Wt Readings from Last 3 Encounters:  10/31/16 143 lb 3.2 oz (65 kg)  07/18/16 149 lb 6.4 oz (67.8 kg)  06/28/16 151 lb 6.4 oz (68.7 kg)    Physical Exam  Constitutional: No distress.  HENT:  Head: Normocephalic and atraumatic.  Neck: Neck supple.  Cardiovascular: Normal rate, regular rhythm and normal heart sounds.   Pulmonary/Chest: Effort normal and breath sounds normal.  Abdominal: Soft. Bowel sounds are normal. She exhibits no distension. There is no tenderness. There is no rebound and no guarding.  Genitourinary:    Genitourinary Comments: Right mid labia with a small nodule that is nontender, no fluctuance, no overlying or surrounding erythema, no other nodules noted, normal-appearing vaginal mucosa and cervix, no cervical motion tenderness, normal bimanual exam  Musculoskeletal: She exhibits no edema.  Lymphadenopathy:       Head (right side): No submental, no submandibular, no preauricular and no posterior auricular adenopathy present.       Head (left side): No submental, no submandibular, no preauricular and no posterior auricular adenopathy present.    She has no cervical adenopathy.       Right: No inguinal and no supraclavicular adenopathy present.       Left: No inguinal and no supraclavicular adenopathy present.  Neurological: She is alert. Gait normal.  Skin: Skin is warm and dry. She is not diaphoretic.     Assessment/Plan: Please see individual problem list.  Essential hypertension At goal. Continue current medication.  Dyspnea on exertion Continues to have some mild issues with this. Potentially could be her thyroid though she does have risk factors for cardiac issues. EKG previously done. Given persistence and risk factors will refer to cardiology to consider evaluation.  Subclinical hyperthyroidism TSH has been low. She's been following with endocrinology. It appears they're going to do a nuclear medicine scan. This is likely contributing to her weight loss. She'll continue to follow with endocrinology.  Weight loss Patient is down 8 pounds by our scale. Suspect related to her subclinical hyperthyroidism. Potentially could be related  to the diarrhea she's been having as well over the last month. She is up-to-date on cancer screening. Exam is overall benign today. She'll continue to follow with endocrinology for her thyroid. We'll check stool studies for the diarrhea. If negative could consider GI evaluation.  Lesion of labia Nodule noted in right mid labia. No signs of infection.  Otherwise benign exam. We will refer to gynecology for evaluation.   Orders Placed This Encounter  Procedures  . Stool Culture    Standing Status:   Future    Standing Expiration Date:   10/31/2017  . Ova and parasite examination    Standing Status:   Future    Standing Expiration Date:   10/31/2017  . Stool C-Diff Toxin Assay    Standing Status:   Future    Standing Expiration Date:   10/31/2017  . CBC  . Comp Met (CMET)  . Sed Rate (ESR)  . Ambulatory referral to Cardiology    Referral Priority:   Routine    Referral Type:   Consultation    Referral Reason:   Specialty Services Required    Requested Specialty:   Cardiology    Number of Visits Requested:   1  . Ambulatory referral to Gynecology    Referral Priority:   Routine    Referral Type:   Consultation    Referral Reason:   Specialty Services Required    Requested Specialty:   Gynecology    Number of Visits Requested:   [place], MD [place]

## 2016-10-31 NOTE — Assessment & Plan Note (Signed)
At goal. Continue current medication. 

## 2016-10-31 NOTE — Assessment & Plan Note (Signed)
Patient is down 8 pounds by our scale. Suspect related to her subclinical hyperthyroidism. Potentially could be related to the diarrhea she's been having as well over the last month. She is up-to-date on cancer screening. Exam is overall benign today. She'll continue to follow with endocrinology for her thyroid. We'll check stool studies for the diarrhea. If negative could consider GI evaluation.

## 2016-10-31 NOTE — Assessment & Plan Note (Signed)
TSH has been low. She's been following with endocrinology. It appears they're going to do a nuclear medicine scan. This is likely contributing to her weight loss. She'll continue to follow with endocrinology.

## 2016-11-04 ENCOUNTER — Other Ambulatory Visit: Payer: 59

## 2016-11-04 DIAGNOSIS — R197 Diarrhea, unspecified: Secondary | ICD-10-CM

## 2016-11-06 LAB — CLOSTRIDIUM DIFFICILE BY PCR: CDIFFPCR: NOT DETECTED

## 2016-11-07 LAB — OVA AND PARASITE EXAMINATION: OP: NONE SEEN

## 2016-11-08 LAB — STOOL CULTURE

## 2016-11-16 ENCOUNTER — Encounter: Payer: Self-pay | Admitting: Obstetrics & Gynecology

## 2016-11-21 ENCOUNTER — Other Ambulatory Visit: Payer: 59

## 2016-11-24 ENCOUNTER — Ambulatory Visit: Payer: 59 | Admitting: Endocrinology

## 2016-12-05 ENCOUNTER — Encounter: Payer: Self-pay | Admitting: Obstetrics & Gynecology

## 2016-12-05 ENCOUNTER — Ambulatory Visit (INDEPENDENT_AMBULATORY_CARE_PROVIDER_SITE_OTHER): Payer: 59 | Admitting: Obstetrics & Gynecology

## 2016-12-05 VITALS — BP 102/70 | HR 83 | Ht 65.0 in | Wt 142.0 lb

## 2016-12-05 DIAGNOSIS — N907 Vulvar cyst: Secondary | ICD-10-CM | POA: Diagnosis not present

## 2016-12-05 DIAGNOSIS — N9089 Other specified noninflammatory disorders of vulva and perineum: Secondary | ICD-10-CM | POA: Diagnosis not present

## 2016-12-05 HISTORY — DX: Vulvar cyst: N90.7

## 2016-12-05 NOTE — Progress Notes (Signed)
HPI:      [name] is a 56 y.o. [phone], No LMP recorded. Patient is postmenopausal., presents today for a problem visit.  She complains of:  Vulvar concern:   This is a 56 y.o. old Caucasian/White female who presents for the evaluation of vulvar lesion(s). She describes the vulvar lesion(s) as elevated, hard. She indicates that she has noticed intermittant lesions that the average size is approximately 42mm to 107mm. She indicates she first noticed worsening of the problem six months ago but maybe for years. She admits to symptoms of irritation.  The following aggravating factors are identified: none. The following alleviating factors are identified: hot baths.  Shehas had no previous colposcopy for this condition. The lesion has had no been biopsied. She has had no previous treatment for this condition.   PMHx: She  has a past medical history of History of blood transfusion; Hypercholesteremia; Hypertension; and Wears dentures. Also,  has a past surgical history that includes Dilation and curettage of uterus; Colonoscopy with propofol (N/A, 03/30/2015); polypectomy (03/30/2015); and Breast cyst aspiration (2015)., family history includes Cancer in her father; Drug abuse in her paternal grandmother; Hypertension in her mother; Kidney disease in her brother.,  reports that she has been smoking.  She has a 15.00 pack-year smoking history. She has never used smokeless tobacco. She reports that she drinks about 0.6 oz of alcohol per week . She reports that she does not use drugs.  She has a current medication list which includes the following prescription(s): amlodipine. Also, has No Known Allergies.  Review of Systems  Constitutional: Negative for chills, fever and malaise/fatigue.  HENT: Negative for congestion, sinus pain and sore throat.   Eyes: Negative for blurred vision and pain.  Respiratory: Negative for cough and wheezing.   Cardiovascular: Negative for chest pain and leg swelling.    Gastrointestinal: Negative for abdominal pain, constipation, diarrhea, heartburn, nausea and vomiting.  Genitourinary: Negative for dysuria, frequency, hematuria and urgency.  Musculoskeletal: Negative for back pain, joint pain, myalgias and neck pain.  Skin: Negative for itching and rash.  Neurological: Negative for dizziness, tremors and weakness.  Endo/Heme/Allergies: Does not bruise/bleed easily.  Psychiatric/Behavioral: Negative for depression. The patient is not nervous/anxious and does not have insomnia.     Objective: BP 102/70   Pulse 83   Ht 5\' 5"  (1.651 m)   Wt 142 lb (64.4 kg)   BMI 23.63 kg/m  Physical Exam  Constitutional: She is oriented to person, place, and time. She appears well-developed and well-nourished. No distress.  Genitourinary: Vagina normal and uterus normal. Pelvic exam was performed with patient supine. There is no rash, tenderness or lesion on the right labia. There is no rash, tenderness or lesion on the left labia. No erythema or bleeding in the vagina. Right adnexum does not display mass and does not display tenderness. Left adnexum does not display mass and does not display tenderness. Cervix does not exhibit motion tenderness, discharge, polyp or nabothian cyst.   Uterus is mobile and midaxial. Uterus is not enlarged or exhibiting a mass.  Genitourinary Comments: Mons pubis two lesions at thigh crease each side, min T and not inflammed today.  No labial lesions seen  Abdominal: Soft. She exhibits no distension. There is no tenderness.  Musculoskeletal: Normal range of motion.  Neurological: She is alert and oriented to person, place, and time. No cranial nerve deficit.  Skin: Skin is warm and dry.  Psychiatric: She has a normal mood and affect.  ASSESSMENT/PLAN:    Problem List Items Addressed This Visit      Other   Vulvar lesion - Primary    Seb cysts, follicle cysts likely Mostly Mons pubis distrubition No vag lesions, min atrophy  noted F/u as needed Avoid shaving and adjust feminine products/perfumes soaps to see if that could be trigger to these lesions  Barnett Applebaum, MD, [facility], [facility] 12/05/2016  9:17 AM

## 2016-12-26 ENCOUNTER — Other Ambulatory Visit (INDEPENDENT_AMBULATORY_CARE_PROVIDER_SITE_OTHER): Payer: 59

## 2016-12-26 DIAGNOSIS — E042 Nontoxic multinodular goiter: Secondary | ICD-10-CM

## 2016-12-26 LAB — TSH: TSH: 0.01 u[IU]/mL — AB (ref 0.35–4.50)

## 2016-12-26 LAB — T4, FREE: FREE T4: 0.79 ng/dL (ref 0.60–1.60)

## 2016-12-26 LAB — T3, FREE: T3 FREE: 4 pg/mL (ref 2.3–4.2)

## 2016-12-29 ENCOUNTER — Ambulatory Visit: Payer: 59 | Admitting: Endocrinology

## 2016-12-30 NOTE — Progress Notes (Signed)
Cardiology Office Note  Date:  01/03/2017   ID:  Alyssa Matthews, DOB 1960/07/21, MRN [id number]  PCP:  Alyssa Haven, MD   Chief Complaint  Patient presents with  . other    DOE per Alyssa Matthews. Pt denies any cp and believes sob is r/t smoking. Pt is currently undergoing thyroid testing. Reviewed meds with pt verbally.    HPI:  Alyssa Matthews is a 56 year old woman with past medical history of Smoker Hypertension Weight loss Thyroid abnormality Who presents by referral from Alyssa Matthews for consultation of her shortness of breath on exertion, chest pain  Smoking since age age 50 to now With stairs, having some SOB Reports that she is able to walk on a flat surface without any symptoms of shortness of breath or chest pain Works for [employer] no symptoms at work, when she walks long distances  She is concerned about her heart given family history  Labs: LDL 75, 2/17 On this day she was on cholesterol pill, this was held for ALT of 40 ALT drop back to normal without the statin  Family hx Mom PAD, stent, LE arterial bypass, smoker Dad still smoking, smokign XRT,chemo Aunt smoking, cancer, chemo  EKG personally reviewed by myself on todays visit Normal sinus rhythm rate 66 bpm T wave abnormality V1 through V4 no significant ST abnormality   PMH:   has a past medical history of History of blood transfusion; Hypercholesteremia; Hypertension; and Wears dentures.  PSH:    Past Surgical History:  Procedure Laterality Date  . BREAST CYST ASPIRATION  2015  . COLONOSCOPY WITH PROPOFOL N/A 03/30/2015   Procedure: COLONOSCOPY WITH PROPOFOL;  Surgeon: Alyssa Lame, MD;  Location: [place] [facility];  Service: Endoscopy;  Laterality: N/A;  . DILATION AND CURETTAGE OF UTERUS    . POLYPECTOMY  03/30/2015   Procedure: POLYPECTOMY;  Surgeon: Alyssa Lame, MD;  Location: [place];  Service: Endoscopy;;    Current Outpatient Prescriptions  Medication Sig Dispense Refill   . amLODipine (NORVASC) 10 MG tablet TAKE 1 TABLET (10 MG TOTAL) BY MOUTH DAILY. 90 tablet 1  . varenicline (CHANTIX CONTINUING MONTH PAK) 1 MG tablet Take 1 tablet (1 mg total) by mouth 2 (two) times daily. 60 tablet 3   No current facility-administered medications for this visit.      Allergies:   Patient has no known allergies.   Social History:  The patient  reports that she has been smoking Cigarettes.  She has a 22.50 pack-year smoking history. She has never used smokeless tobacco. She reports that she drinks about 0.6 oz of alcohol per week . She reports that she does not use drugs.   Family History:   family history includes Cancer in her father; Drug abuse in her paternal grandmother; Hypertension in her brother and mother; Kidney disease in her brother.    Review of Systems: Review of Systems  Constitutional: Negative.   Respiratory: Negative.   Cardiovascular: Negative.   Gastrointestinal: Negative.   Musculoskeletal: Negative.   Neurological: Negative.   Psychiatric/Behavioral: Negative.   All other systems reviewed and are negative.    PHYSICAL EXAM: VS:  BP 112/72 (BP Location: Right Arm, Patient Position: Sitting, Cuff Size: Normal)   Pulse 66   Ht 5\' 4"  (1.626 m)   Wt 144 lb 8 oz (65.5 kg)   BMI 24.80 kg/m  , BMI Body mass index is 24.8 kg/m. GEN: Well nourished, well developed, in no acute distress  HEENT: normal  Neck: no JVD, carotid bruits, or masses Cardiac: RRR; no murmurs, rubs, or gallops,no edema  Respiratory:  clear to auscultation bilaterally, normal work of breathing GI: soft, nontender, nondistended, + BS MS: no deformity or atrophy  Skin: warm and dry, no rash Neuro:  Strength and sensation are intact Psych: euthymic mood, full affect    Recent Labs: 10/31/2016: ALT 11; BUN 13; Creatinine, Ser 0.63; Hemoglobin 13.5; Platelets 272.0; Potassium 3.7; Sodium 141 12/26/2016: TSH 0.01    Lipid Panel Lab Results  Component Value Date    CHOL 189 01/01/2015   HDL 61.00 01/01/2015   LDLCALC 109 (H) 01/01/2015   TRIG 95.0 01/01/2015      Wt Readings from Last 3 Encounters:  01/03/17 144 lb 8 oz (65.5 kg)  12/05/16 142 lb (64.4 kg)  10/31/16 143 lb 3.2 oz (65 kg)       ASSESSMENT AND PLAN:  Dyspnea on exertion - Plan: EKG 12-Lead Suspect her shortness of breath is secondary to conditioning, long smoking history for 35 years Seems to present only with hills and stairs, fine on flat surface Discussed various strategies for risk stratification given strong family history and her smoking history Not discussed stress testing  and calcium scoring We will start with CT coronary calcium score.  If this is elevated would retry alternate statin and push for smoking cessation.  If score is markedly elevated she might need stress testing  Tobacco abuse Long discussion concerning smoking cessation techniques Recommended she try nicotine patch This worked for her in the past and if unsuccessful recommended she try Chantix and set a quit date Husband also smokes which makes this difficult  Elevated glucose We have encouraged continued exercise, careful diet management in an effort to lose weight.  Subclinical hyperthyroid  she has follow-up in [facility] for results next week ism   Disposition:   F/U  as needed  Patient was seen in consultation for Alyssa Matthews and will be referred back to his office for ongoing care of the issues detailed above   Total encounter time more than 60 minutes  Greater than 50% was spent in counseling and coordination of care with the patient   Orders Placed This Encounter  Procedures  . EKG 12-Lead     Signed, Alyssa Matthews, M.D., Ph.D. 01/03/2017  [place], [place]

## 2017-01-03 ENCOUNTER — Ambulatory Visit (INDEPENDENT_AMBULATORY_CARE_PROVIDER_SITE_OTHER): Payer: 59 | Admitting: Cardiovascular Disease

## 2017-01-03 ENCOUNTER — Encounter: Payer: Self-pay | Admitting: Cardiovascular Disease

## 2017-01-03 VITALS — BP 112/72 | HR 66 | Ht 64.0 in | Wt 144.5 lb

## 2017-01-03 DIAGNOSIS — R7309 Other abnormal glucose: Secondary | ICD-10-CM | POA: Diagnosis not present

## 2017-01-03 DIAGNOSIS — E0590 Thyrotoxicosis, unspecified without thyrotoxic crisis or storm: Secondary | ICD-10-CM

## 2017-01-03 DIAGNOSIS — Z720 Tobacco use: Secondary | ICD-10-CM

## 2017-01-03 DIAGNOSIS — R0602 Shortness of breath: Secondary | ICD-10-CM | POA: Diagnosis not present

## 2017-01-03 DIAGNOSIS — R0600 Dyspnea, unspecified: Secondary | ICD-10-CM

## 2017-01-03 DIAGNOSIS — R0609 Other forms of dyspnea: Secondary | ICD-10-CM | POA: Diagnosis not present

## 2017-01-03 MED ORDER — VARENICLINE TARTRATE 1 MG PO TABS: 1.0000 mg | ORAL_TABLET | Freq: Two times a day (BID) | ORAL | 3 refills | Status: DC

## 2017-01-03 NOTE — Addendum Note (Signed)
Addended by: Valora Corporal on: 01/03/2017 10:16 AM   Modules accepted: Orders

## 2017-01-03 NOTE — Patient Instructions (Addendum)
Medication Instructions:   No medication changes made  Labwork:  No new labs needed  Testing/Procedures:  We will order a CT coronary calcium score in [place] for SOB, abn ekg Call [phone] Test is $150.00 No prep is needed  Address: [address] [place], [place] number [phone]   Follow-Up: It was a pleasure seeing you in the office today. Please call us if you have new issues that need to be addressed before your next appt.  [phone]  Your physician wants you to follow-up in: as needed We will call you with the results  If you need a refill on your cardiac medications before your next appointment, please call your pharmacy.

## 2017-01-09 ENCOUNTER — Encounter: Payer: Self-pay | Admitting: Endocrinology

## 2017-01-09 ENCOUNTER — Ambulatory Visit (INDEPENDENT_AMBULATORY_CARE_PROVIDER_SITE_OTHER): Payer: 59 | Admitting: Endocrinology

## 2017-01-09 ENCOUNTER — Ambulatory Visit: Admission: RE | Admit: 2017-01-09 | Payer: 59 | Source: Ambulatory Visit

## 2017-01-09 VITALS — BP 120/74 | HR 71 | Ht 64.0 in | Wt 142.6 lb

## 2017-01-09 DIAGNOSIS — E0520 Thyrotoxicosis with toxic multinodular goiter without thyrotoxic crisis or storm: Secondary | ICD-10-CM

## 2017-01-09 NOTE — Progress Notes (Signed)
Patient ID: Alyssa Matthews, female   DOB: 1960/11/27, 56 y.o.   MRN: [id number]                                                                                                               Reason for Appointment:  Hyperthyroidism, follow-up   Referring physician: Caryl Bis  Chief complaint: Follow-up of thyroid   History of Present Illness:   Background history obtained on the initial consultation: She was seen by her PCP for general physical exam and had TSH done as a screening test Although the patient says that she feels fine and has no physical symptoms she was reporting that she was getting out of breath on climbing stairs to her PCP Also for an uncertain period of time occasionally she feels her heart beating fast but mostly when she is nervous or tense She has not had any weight loss She does not think she has any shakiness, feeling excessively warm and sweaty, nervousness, and fatigue.  She tends to feel hot and cold alternately and this has been going on for some time  RECENT history:  She was found to have  toxic nodular goiter on her initial exam  Subsequently her free T3 levels have been fluctuating somewhat including improved levels in 5/18  Because of her suppressed TSH she was recommended and nuclear thyroid scan in May but somehow she was never scheduled for this  Currently the patient feels fairly well except that she has been losing weight this year despite a fairly good appetite and she is not clear why She does not complain of shakiness, heat intolerance or any palpitations  Her free T3 is now upper normal with a low TSH and normal free T4   Wt Readings from Last 3 Encounters:  01/09/17 142 lb 9.6 oz (64.7 kg)  01/03/17 144 lb 8 oz (65.5 kg)  12/05/16 142 lb (64.4 kg)      Thyroid function tests as follows:     Lab Results  Component Value Date   FREET4 0.79 12/26/2016   FREET4 0.72 07/18/2016   FREET4 0.80 04/22/2016   T3FREE 4.0 12/26/2016   T3FREE 3.6 07/18/2016   T3FREE 4.1 05/26/2016   TSH 0.01 (L) 12/26/2016   TSH 0.07 (L) 07/18/2016   TSH 0.14 (L) 04/18/2016    Lab Results  Component Value Date   THYROTRECAB 0.62 05/26/2016     Allergies as of 01/09/2017   No Known Allergies     Medication List       Accurate as of 01/09/17 10:01 AM. Always use your most recent med list.          amLODipine 10 MG tablet Commonly known as:  NORVASC TAKE 1 TABLET (10 MG TOTAL) BY MOUTH DAILY.   varenicline 1 MG tablet Commonly known as:  CHANTIX CONTINUING MONTH PAK Take 1 tablet (1 mg total) by mouth 2 (two) times daily.           Past Medical History:  Diagnosis Date  . History of  blood transfusion   . Hypercholesteremia   . Hypertension   . Wears dentures    partial upper    Past Surgical History:  Procedure Laterality Date  . BREAST CYST ASPIRATION  2015  . COLONOSCOPY WITH PROPOFOL N/A 03/30/2015   Procedure: COLONOSCOPY WITH PROPOFOL;  Surgeon: Lucilla Lame, MD;  Location: [place];  Service: Endoscopy;  Laterality: N/A;  . DILATION AND CURETTAGE OF UTERUS    . POLYPECTOMY  03/30/2015   Procedure: POLYPECTOMY;  Surgeon: Lucilla Lame, MD;  Location: [place];  Service: Endoscopy;;    Family History  Problem Relation Age of Onset  . Hypertension Mother   . Kidney disease Brother   . Cancer Father        In neck  . Drug abuse Paternal Grandmother   . Hypertension Brother   . Breast cancer Neg Hx   . Thyroid disease Neg Hx     Social History:  reports that she has been smoking Cigarettes.  She has a 22.50 pack-year smoking history. She has never used smokeless tobacco. She reports that she drinks about 0.6 oz of alcohol per week . She reports that she does not use drugs.  Allergies: No Known Allergies   Review of Systems    Examination:   BP 120/74   Pulse 71   Ht 5\' 4"  (1.626 m)   Wt 142 lb 9.6 oz (64.7 kg)   SpO2 98%   BMI 24.48 kg/m   The thyroid is enlarged on  the left side about 2.5- 3 times normal, smooth and firm Right side and isthmus are also enlarged especially medially, about twice normal   Extremities: hands are not unusually warm  No tremor Biceps reflexes appear normal    Assessment/Plan:  MULTINODULAR goiter with relatively larger left side enlargement  She has had subclinical hyperthyroidism with mostly upper normal T3 levels TSH again significantly suppressed She is not symptomatic currently but maybe losing weight from her subclinical hyperthyroidism  Discussed that she probably has a toxic nodular goiter or hot nodule and need to schedule her scan and uptake, again referral was placed today and she will call if she does not hear from them within the next 5 days  Since treatment of her thyroid is more imperative she can hold off on her cardiac CT scan until after the treatment is done otherwise the I-131 study the left to be postponed x 6-8 weeks  Aeneas Longsworth 01/09/2017, 10:01 AM

## 2017-01-10 ENCOUNTER — Other Ambulatory Visit: Payer: Self-pay | Admitting: Endocrinology

## 2017-01-10 NOTE — Addendum Note (Signed)
Addended by: Elayne Snare on: 01/10/2017 12:45 PM   Modules accepted: Orders

## 2017-01-23 ENCOUNTER — Encounter
Admission: RE | Admit: 2017-01-23 | Discharge: 2017-01-23 | Disposition: A | Discharge status: Home / Self Care | Payer: 59 | Source: Ambulatory Visit | Attending: Endocrinology | Admitting: Endocrinology

## 2017-01-23 DIAGNOSIS — E0520 Thyrotoxicosis with toxic multinodular goiter without thyrotoxic crisis or storm: Secondary | ICD-10-CM

## 2017-01-23 MED ORDER — SODIUM IODIDE I-123 7.4 MBQ CAPS
142.0000 | ORAL_CAPSULE | Freq: Once | ORAL | Status: AC
  Administered 2017-01-23: 142 via ORAL

## 2017-01-24 ENCOUNTER — Encounter
Admission: RE | Admit: 2017-01-24 | Discharge: 2017-01-24 | Disposition: A | Discharge status: Home / Self Care | Payer: 59 | Source: Ambulatory Visit | Attending: Endocrinology | Admitting: Endocrinology

## 2017-01-24 DIAGNOSIS — E049 Nontoxic goiter, unspecified: Secondary | ICD-10-CM | POA: Diagnosis not present

## 2017-01-30 ENCOUNTER — Other Ambulatory Visit: Payer: Self-pay | Admitting: Endocrinology

## 2017-01-30 DIAGNOSIS — E0520 Thyrotoxicosis with toxic multinodular goiter without thyrotoxic crisis or storm: Secondary | ICD-10-CM

## 2017-02-16 ENCOUNTER — Other Ambulatory Visit: Payer: Self-pay

## 2017-02-16 ENCOUNTER — Ambulatory Visit (INDEPENDENT_AMBULATORY_CARE_PROVIDER_SITE_OTHER): Payer: 59 | Admitting: Family Medicine

## 2017-02-16 ENCOUNTER — Encounter: Payer: Self-pay | Admitting: Family Medicine

## 2017-02-16 DIAGNOSIS — I10 Essential (primary) hypertension: Secondary | ICD-10-CM | POA: Diagnosis not present

## 2017-02-16 DIAGNOSIS — N9089 Other specified noninflammatory disorders of vulva and perineum: Secondary | ICD-10-CM | POA: Diagnosis not present

## 2017-02-16 DIAGNOSIS — M25511 Pain in right shoulder: Secondary | ICD-10-CM | POA: Diagnosis not present

## 2017-02-16 DIAGNOSIS — R634 Abnormal weight loss: Secondary | ICD-10-CM

## 2017-02-16 DIAGNOSIS — G8929 Other chronic pain: Secondary | ICD-10-CM | POA: Diagnosis not present

## 2017-02-16 DIAGNOSIS — E0590 Thyrotoxicosis, unspecified without thyrotoxic crisis or storm: Secondary | ICD-10-CM | POA: Diagnosis not present

## 2017-02-16 DIAGNOSIS — M25512 Pain in left shoulder: Secondary | ICD-10-CM | POA: Diagnosis not present

## 2017-02-16 DIAGNOSIS — Z720 Tobacco use: Secondary | ICD-10-CM | POA: Diagnosis not present

## 2017-02-16 NOTE — Progress Notes (Signed)
Tommi Rumps, MD Phone: [phone]  Alyssa Matthews is a 56 y.o. female who presents today for f/u.  Patient has undergone evaluation for subclinical hyperthyroidism.  She saw endocrinology and nuclear medicine scan.  This revealed possible hyperactive nodule.  She had been losing weight a little bit though her weight has gone back up.  They are planning on doing radioiodine ablation.  Some intermittent postmenopausal hot flashes though no sweats.  No night sweats.  No itching.  Cancer screenings up-to-date.  Prior chest x-ray normal.  Hypertension: Not checking at home.  Taking amlodipine.  No chest pain or shortness of breath.  No edema.  Her diarrhea went away.  Tobacco abuse: She is currently smoking.  She wonders about Chantix which has been prescribed by her cardiologist.  Patches in the past worked.  Gum was not helpful.  She reports bilateral shoulder pain left greater than right.  Typically only bothers her at night when she lays down.  She will probably with the pillow and this will help.  No specific movements are.  No injuries.  She occasionally takes Tylenol for this.  Social History   Tobacco Use  Smoking Status Current Every Day Smoker  . Packs/day: 0.75  . Years: 30.00  . Pack years: 22.50  . Types: Cigarettes  Smokeless Tobacco Never Used     ROS see history of present illness  Objective  Physical Exam Vitals:   02/16/17 1057  BP: 110/78  Pulse: 75  Temp: 98.4 F (36.9 C)  SpO2: 98%    BP Readings from Last 3 Encounters:  02/16/17 110/78  01/09/17 120/74  01/03/17 112/72   Wt Readings from Last 3 Encounters:  02/16/17 144 lb 12.8 oz (65.7 kg)  01/09/17 142 lb 9.6 oz (64.7 kg)  01/03/17 144 lb 8 oz (65.5 kg)    Physical Exam  Constitutional: No distress.  Cardiovascular: Normal rate, regular rhythm and normal heart sounds.  Pulmonary/Chest: Effort normal and breath sounds normal.  Abdominal: Soft. Bowel sounds are normal. She exhibits no  distension. There is no tenderness. There is no rebound and no guarding.  Musculoskeletal: She exhibits no edema.  Bilateral shoulders are symmetric with no skin changes, no tenderness, full active and passive range of motion with no discomfort, negative empty can, negative drop arm  Neurological: She is alert. Gait normal.  Skin: Skin is warm and dry. She is not diaphoretic.     Assessment/Plan: Please see individual problem list.  Vulvar lesion Evaluated by gynecology.  No significant lesions noted at that time.  Tobacco abuse Appears Chantix would be an okay option for her.  She notes no psychiatric issues.  She can try this if it is affordable.  If not can try nicotine patches.  Subclinical hyperthyroidism She has seen endocrinology.  She is going to undergo treatment for this.  Essential hypertension Well-controlled.  Continue current regimen.  Weight loss Has increased some.  Suspect related to her hyperthyroidism.  Her diarrhea has stopped.  She is up-to-date on cancer screening.  Prior chest x-ray unremarkable.  Lab work has been unremarkable as well with the exception of her thyroid labs.  Continue to monitor and follow after hyperthyroidism treatment.  Shoulder pain, bilateral Suspect possible arthritis in her shoulders as the cause.  She has a benign exam today.  Given exercises.  Tylenol for discomfort.  If not improving consider x-ray.   [place] was seen today for follow-up.  Diagnoses and all orders for this visit:  Vulvar  lesion  Tobacco abuse  Subclinical hyperthyroidism  Essential hypertension  Weight loss  Chronic pain of both shoulders    No orders of the defined types were placed in this encounter.   No orders of the defined types were placed in this encounter.    Tommi Rumps, MD [place]

## 2017-02-16 NOTE — Assessment & Plan Note (Signed)
Appears Chantix would be an okay option for her.  She notes no psychiatric issues.  She can try this if it is affordable.  If not can try nicotine patches.

## 2017-02-16 NOTE — Assessment & Plan Note (Signed)
Suspect possible arthritis in her shoulders as the cause.  She has a benign exam today.  Given exercises.  Tylenol for discomfort.  If not improving consider x-ray.

## 2017-02-16 NOTE — Assessment & Plan Note (Signed)
Well-controlled.  Continue current regimen. 

## 2017-02-16 NOTE — Assessment & Plan Note (Signed)
Evaluated by gynecology.  No significant lesions noted at that time.

## 2017-02-16 NOTE — Patient Instructions (Addendum)
Nice to see you. Consider taking the Chantix.  She can always consider using nicotine patches. Please do the exercises for your shoulders.  If your shoulders do not improve please let us know. Please monitor for recurrence of diarrhea.   Shoulder Exercises Ask your health care provider which exercises are safe for you. Do exercises exactly as told by your health care provider and adjust them as directed. It is normal to feel mild stretching, pulling, tightness, or discomfort as you do these exercises, but you should stop right away if you feel sudden pain or your pain gets worse.Do not begin these exercises until told by your health care provider. RANGE OF MOTION EXERCISES These exercises warm up your muscles and joints and improve the movement and flexibility of your shoulder. These exercises also help to relieve pain, numbness, and tingling. These exercises involve stretching your injured shoulder directly. Exercise A: Pendulum  1. Stand near a wall or a surface that you can hold onto for balance. 2. Bend at the waist and let your left / right arm hang straight down. Use your other arm to support you. Keep your back straight and do not lock your knees. 3. Relax your left / right arm and shoulder muscles, and move your hips and your trunk so your left / right arm swings freely. Your arm should swing because of the motion of your body, not because you are using your arm or shoulder muscles. 4. Keep moving your body so your arm swings in the following directions, as told by your health care provider: ? Side to side. ? Forward and backward. ? In clockwise and counterclockwise circles. 5. Continue each motion for __________ seconds, or for as long as told by your health care provider. 6. Slowly return to the starting position. Repeat __________ times. Complete this exercise __________ times a day. Exercise B:Flexion, Standing  1. Stand and hold a broomstick, a cane, or a similar object. Place  your hands a little more than shoulder-width apart on the object. Your left / right hand should be palm-up, and your other hand should be palm-down. 2. Keep your elbow straight and keep your shoulder muscles relaxed. Push the stick down with your healthy arm to raise your left / right arm in front of your body, and then over your head until you feel a stretch in your shoulder. ? Avoid shrugging your shoulder while you raise your arm. Keep your shoulder blade tucked down toward the middle of your back. 3. Hold for __________ seconds. 4. Slowly return to the starting position. Repeat __________ times. Complete this exercise __________ times a day. Exercise C: Abduction, Standing 1. Stand and hold a broomstick, a cane, or a similar object. Place your hands a little more than shoulder-width apart on the object. Your left / right hand should be palm-up, and your other hand should be palm-down. 2. While keeping your elbow straight and your shoulder muscles relaxed, push the stick across your body toward your left / right side. Raise your left / right arm to the side of your body and then over your head until you feel a stretch in your shoulder. ? Do not raise your arm above shoulder height, unless your health care provider tells you to do that. ? Avoid shrugging your shoulder while you raise your arm. Keep your shoulder blade tucked down toward the middle of your back. 3. Hold for __________ seconds. 4. Slowly return to the starting position. Repeat __________ times. Complete this exercise  __________ times a day. Exercise D:Internal Rotation  1. Place your left / right hand behind your back, palm-up. 2. Use your other hand to dangle an exercise band, a towel, or a similar object over your shoulder. Grasp the band with your left / right hand so you are holding onto both ends. 3. Gently pull up on the band until you feel a stretch in the front of your left / right shoulder. ? Avoid shrugging your  shoulder while you raise your arm. Keep your shoulder blade tucked down toward the middle of your back. 4. Hold for __________ seconds. 5. Release the stretch by letting go of the band and lowering your hands. Repeat __________ times. Complete this exercise __________ times a day. STRETCHING EXERCISES These exercises warm up your muscles and joints and improve the movement and flexibility of your shoulder. These exercises also help to relieve pain, numbness, and tingling. These exercises are done using your healthy shoulder to help stretch the muscles of your injured shoulder. Exercise E: Warehouse manager (External Rotation and Abduction)  1. Stand in a doorway with one of your feet slightly in front of the other. This is called a staggered stance. If you cannot reach your forearms to the door frame, stand facing a corner of a room. 2. Choose one of the following positions as told by your health care provider: ? Place your hands and forearms on the door frame above your head. ? Place your hands and forearms on the door frame at the height of your head. ? Place your hands on the door frame at the height of your elbows. 3. Slowly move your weight onto your front foot until you feel a stretch across your chest and in the front of your shoulders. Keep your head and chest upright and keep your abdominal muscles tight. 4. Hold for __________ seconds. 5. To release the stretch, shift your weight to your back foot. Repeat __________ times. Complete this stretch __________ times a day. Exercise F:Extension, Standing 1. Stand and hold a broomstick, a cane, or a similar object behind your back. ? Your hands should be a little wider than shoulder-width apart. ? Your palms should face away from your back. 2. Keeping your elbows straight and keeping your shoulder muscles relaxed, move the stick away from your body until you feel a stretch in your shoulder. ? Avoid shrugging your shoulders while you move the  stick. Keep your shoulder blade tucked down toward the middle of your back. 3. Hold for __________ seconds. 4. Slowly return to the starting position. Repeat __________ times. Complete this exercise __________ times a day. STRENGTHENING EXERCISES These exercises build strength and endurance in your shoulder. Endurance is the ability to use your muscles for a long time, even after they get tired. Exercise G:External Rotation  1. Sit in a stable chair without armrests. 2. Secure an exercise band at elbow height on your left / right side. 3. Place a soft object, such as a folded towel or a small pillow, between your left / right upper arm and your body to move your elbow a few inches away (about 10 cm) from your side. 4. Hold the end of the band so it is tight and there is no slack. 5. Keeping your elbow pressed against the soft object, move your left / right forearm out, away from your abdomen. Keep your body steady so only your forearm moves. 6. Hold for __________ seconds. 7. Slowly return to the starting position. Repeat  __________ times. Complete this exercise __________ times a day. Exercise H:Shoulder Abduction  1. Sit in a stable chair without armrests, or stand. 2. Hold a __________ weight in your left / right hand, or hold an exercise band with both hands. 3. Start with your arms straight down and your left / right palm facing in, toward your body. 4. Slowly lift your left / right hand out to your side. Do not lift your hand above shoulder height unless your health care provider tells you that this is safe. ? Keep your arms straight. ? Avoid shrugging your shoulder while you do this movement. Keep your shoulder blade tucked down toward the middle of your back. 5. Hold for __________ seconds. 6. Slowly lower your arm, and return to the starting position. Repeat __________ times. Complete this exercise __________ times a day. Exercise I:Shoulder Extension 1. Sit in a stable chair  without armrests, or stand. 2. Secure an exercise band to a stable object in front of you where it is at shoulder height. 3. Hold one end of the exercise band in each hand. Your palms should face each other. 4. Straighten your elbows and lift your hands up to shoulder height. 5. Step back, away from the secured end of the exercise band, until the band is tight and there is no slack. 6. Squeeze your shoulder blades together as you pull your hands down to the sides of your thighs. Stop when your hands are straight down by your sides. Do not let your hands go behind your body. 7. Hold for __________ seconds. 8. Slowly return to the starting position. Repeat __________ times. Complete this exercise __________ times a day. Exercise J:Standing Shoulder Row 1. Sit in a stable chair without armrests, or stand. 2. Secure an exercise band to a stable object in front of you so it is at waist height. 3. Hold one end of the exercise band in each hand. Your palms should be in a thumbs-up position. 4. Bend each of your elbows to an "L" shape (about 90 degrees) and keep your upper arms at your sides. 5. Step back until the band is tight and there is no slack. 6. Slowly pull your elbows back behind you. 7. Hold for __________ seconds. 8. Slowly return to the starting position. Repeat __________ times. Complete this exercise __________ times a day. Exercise K:Shoulder Press-Ups  1. Sit in a stable chair that has armrests. Sit upright, with your feet flat on the floor. 2. Put your hands on the armrests so your elbows are bent and your fingers are pointing forward. Your hands should be about even with the sides of your body. 3. Push down on the armrests and use your arms to lift yourself off of the chair. Straighten your elbows and lift yourself up as much as you comfortably can. ? Move your shoulder blades down, and avoid letting your shoulders move up toward your ears. ? Keep your feet on the ground. As you  get stronger, your feet should support less of your body weight as you lift yourself up. 4. Hold for __________ seconds. 5. Slowly lower yourself back into the chair. Repeat __________ times. Complete this exercise __________ times a day. Exercise L: Wall Push-Ups  1. Stand so you are facing a stable wall. Your feet should be about one arm-length away from the wall. 2. Lean forward and place your palms on the wall at shoulder height. 3. Keep your feet flat on the floor as you bend your  elbows and lean forward toward the wall. 4. Hold for __________ seconds. 5. Straighten your elbows to push yourself back to the starting position. Repeat __________ times. Complete this exercise __________ times a day. This information is not intended to replace advice given to you by your health care provider. Make sure you discuss any questions you have with your health care provider. Document Released: 01/12/2005 Document Revised: 11/23/2015 Document Reviewed: 11/09/2014 Elsevier Interactive Patient Education  2018 [employer].

## 2017-02-16 NOTE — Assessment & Plan Note (Signed)
She has seen endocrinology.  She is going to undergo treatment for this.

## 2017-02-16 NOTE — Assessment & Plan Note (Signed)
Has increased some.  Suspect related to her hyperthyroidism.  Her diarrhea has stopped.  She is up-to-date on cancer screening.  Prior chest x-ray unremarkable.  Lab work has been unremarkable as well with the exception of her thyroid labs.  Continue to monitor and follow after hyperthyroidism treatment.

## 2017-03-24 ENCOUNTER — Encounter (HOSPITAL_COMMUNITY)
Admission: RE | Admit: 2017-03-24 | Discharge: 2017-03-24 | Disposition: A | Discharge status: Home / Self Care | Payer: 59 | Source: Ambulatory Visit | Attending: Endocrinology | Admitting: Endocrinology

## 2017-03-24 DIAGNOSIS — E0520 Thyrotoxicosis with toxic multinodular goiter without thyrotoxic crisis or storm: Secondary | ICD-10-CM | POA: Diagnosis not present

## 2017-03-24 DIAGNOSIS — E0590 Thyrotoxicosis, unspecified without thyrotoxic crisis or storm: Secondary | ICD-10-CM | POA: Diagnosis not present

## 2017-03-24 LAB — HCG, SERUM, QUALITATIVE: Preg, Serum: NEGATIVE

## 2017-03-24 MED ORDER — SODIUM IODIDE I 131 CAPSULE
29.5000 | Freq: Once | INTRAVENOUS | Status: AC | PRN
  Administered 2017-03-24: 29.5 via ORAL

## 2017-04-16 ENCOUNTER — Other Ambulatory Visit: Payer: Self-pay | Admitting: Family Medicine

## 2017-04-17 ENCOUNTER — Other Ambulatory Visit (INDEPENDENT_AMBULATORY_CARE_PROVIDER_SITE_OTHER): Payer: 59

## 2017-04-17 DIAGNOSIS — E0520 Thyrotoxicosis with toxic multinodular goiter without thyrotoxic crisis or storm: Secondary | ICD-10-CM

## 2017-04-17 LAB — T4, FREE: FREE T4: 0.73 ng/dL (ref 0.60–1.60)

## 2017-04-17 LAB — TSH: TSH: 0.33 u[IU]/mL — AB (ref 0.35–4.50)

## 2017-04-17 LAB — T3, FREE: T3, Free: 3.4 pg/mL (ref 2.3–4.2)

## 2017-04-23 NOTE — Progress Notes (Signed)
Patient ID: Alyssa Matthews, female   DOB: [birth year], 57 y.o.   MRN: [id number]                                                                                                                 Referring physician: Caryl Bis  Chief complaint: Follow-up of thyroid   History of Present Illness:   Background history obtained on the initial consultation: She was seen by her PCP for general physical exam and had TSH done as a screening test Although the patient says that she feels fine and has no physical symptoms she was reporting that she was getting out of breath on climbing stairs to her PCP Also for an uncertain period of time occasionally she feels her heart beating fast but mostly when she is nervous or tense She has not had any weight loss She does not think she has any shakiness, feeling excessively warm and sweaty, nervousness, and fatigue.  She tends to feel hot and cold alternately and this has been going on for some time  RECENT history:  She was found to have  toxic nodular goiter on her initial exam  Subsequently her free T3 levels had been fluctuating but on her visit in October 2018 it was high normal at 4.0  She had also had some complaints of weight loss which was not explain otherwise  She received RADIOACTIVE IODINE treatment of 29.5 mCi in 03/2017 Although she had a low uptake of 9.5% she had an overactive nodule on the left side on the scan  She has not lost any further weight, has gone up 2 pounds However recently she had a significant upper respiratory infection, reportedly influenza and she was fairly sick and lost her appetite, she is only starting to feel better this last weekend  Her free T3 is now improved and her TSH is not a suppressed   Wt Readings from Last 3 Encounters:  04/24/17 144 lb 3.2 oz (65.4 kg)  02/16/17 144 lb 12.8 oz (65.7 kg)  01/09/17 142 lb 9.6 oz (64.7 kg)      Thyroid function tests as follows:     Lab Results  Component  Value Date   FREET4 0.73 04/17/2017   FREET4 0.79 12/26/2016   FREET4 0.72 07/18/2016   T3FREE 3.4 04/17/2017   T3FREE 4.0 12/26/2016   T3FREE 3.6 07/18/2016   TSH 0.33 (L) 04/17/2017   TSH 0.01 (L) 12/26/2016   TSH 0.07 (L) 07/18/2016    Lab Results  Component Value Date   THYROTRECAB 0.62 05/26/2016     Allergies as of 04/24/2017   No Known Allergies     Medication List        Accurate as of 04/24/17 10:22 AM. Always use your most recent med list.          amLODipine 10 MG tablet Commonly known as:  NORVASC TAKE 1 TABLET (10 MG TOTAL) BY MOUTH DAILY.           Past Medical History:  Diagnosis  Date  . History of blood transfusion   . Hypercholesteremia   . Hypertension   . Wears dentures    partial upper    Past Surgical History:  Procedure Laterality Date  . BREAST CYST ASPIRATION  2015  . COLONOSCOPY WITH PROPOFOL N/A 03/30/2015   Procedure: COLONOSCOPY WITH PROPOFOL;  Surgeon: Lucilla Lame, MD;  Location: [facility];  Service: Endoscopy;  Laterality: N/A;  . DILATION AND CURETTAGE OF UTERUS    . POLYPECTOMY  03/30/2015   Procedure: POLYPECTOMY;  Surgeon: Lucilla Lame, MD;  Location: [facility];  Service: Endoscopy;;    Family History  Problem Relation Age of Onset  . Hypertension Mother   . Kidney disease Brother   . Cancer Father        In neck  . Drug abuse Paternal Grandmother   . Hypertension Brother   . Breast cancer Neg Hx   . Thyroid disease Neg Hx     Social History:  reports that she has been smoking cigarettes.  She has a 22.50 pack-year smoking history. she has never used smokeless tobacco. She reports that she drinks about 0.6 oz of alcohol per week. She reports that she does not use drugs.  Allergies: No Known Allergies   Review of Systems  She takes Norvasc for hypertension from her PCP   Examination:   BP 120/73 (BP Location: Left Arm, Patient Position: Sitting, Cuff Size: Normal)   Pulse 76   Ht 5\' 4"   (1.626 m)   Wt 144 lb 3.2 oz (65.4 kg)   BMI 24.75 kg/m   She looks well The thyroid is enlarged on the left side about 2-2.5 times normal, this is very firm and smooth Right side and isthmus are also enlarged about twice normal and firm  The hands are not unusually warm  Biceps reflexes appear normal    Assessment/Plan:  TOXIC NODULE with multinodular goiter  She had subclinical hyperthyroidism with upper normal T3 levels Because of her weight loss and persistently undetectable TSH she has been treated with [id number] Now she has normal thyroid levels more recently and TSH is not low; also she has gained back to [place]  This is despite her having a significant viral illness over the last 3 or 4 weeks and losing her appetite from this  Her left thyroid lobe appears slightly smaller also on exam already Discussed that we need to follow her thyroid levels and consider thyroid supplementation if her thyroid levels get below normal which is relatively unlikely  She will follow-up with her PCP if she still has problems with decreased appetite  Elayne Snare 04/24/2017, 10:22 AM

## 2017-04-24 ENCOUNTER — Encounter: Payer: Self-pay | Admitting: Endocrinology

## 2017-04-24 ENCOUNTER — Ambulatory Visit (INDEPENDENT_AMBULATORY_CARE_PROVIDER_SITE_OTHER): Payer: 59 | Admitting: Endocrinology

## 2017-04-24 VITALS — BP 120/73 | HR 76 | Ht 64.0 in | Wt 144.2 lb

## 2017-04-24 DIAGNOSIS — E0520 Thyrotoxicosis with toxic multinodular goiter without thyrotoxic crisis or storm: Secondary | ICD-10-CM

## 2017-05-15 ENCOUNTER — Other Ambulatory Visit: Payer: Self-pay | Admitting: Family Medicine

## 2017-05-15 DIAGNOSIS — Z1231 Encounter for screening mammogram for malignant neoplasm of breast: Secondary | ICD-10-CM

## 2017-05-17 ENCOUNTER — Ambulatory Visit (INDEPENDENT_AMBULATORY_CARE_PROVIDER_SITE_OTHER): Payer: 59

## 2017-05-17 ENCOUNTER — Ambulatory Visit (INDEPENDENT_AMBULATORY_CARE_PROVIDER_SITE_OTHER): Payer: 59 | Admitting: Family Medicine

## 2017-05-17 ENCOUNTER — Other Ambulatory Visit: Payer: Self-pay

## 2017-05-17 ENCOUNTER — Encounter: Payer: Self-pay | Admitting: Family Medicine

## 2017-05-17 VITALS — BP 108/68 | HR 77 | Temp 98.2°F | Wt 144.8 lb

## 2017-05-17 DIAGNOSIS — I10 Essential (primary) hypertension: Secondary | ICD-10-CM | POA: Diagnosis not present

## 2017-05-17 DIAGNOSIS — R42 Dizziness and giddiness: Secondary | ICD-10-CM | POA: Diagnosis not present

## 2017-05-17 DIAGNOSIS — E0590 Thyrotoxicosis, unspecified without thyrotoxic crisis or storm: Secondary | ICD-10-CM

## 2017-05-17 DIAGNOSIS — Z720 Tobacco use: Secondary | ICD-10-CM

## 2017-05-17 DIAGNOSIS — G8929 Other chronic pain: Secondary | ICD-10-CM

## 2017-05-17 DIAGNOSIS — M25512 Pain in left shoulder: Secondary | ICD-10-CM | POA: Diagnosis not present

## 2017-05-17 DIAGNOSIS — J069 Acute upper respiratory infection, unspecified: Secondary | ICD-10-CM | POA: Diagnosis not present

## 2017-05-17 HISTORY — DX: Acute upper respiratory infection, unspecified: J06.9

## 2017-05-17 HISTORY — DX: Other chronic pain: G89.29

## 2017-05-17 LAB — LIPID PANEL
CHOL/HDL RATIO: 3
Cholesterol: 182 mg/dL (ref 0–200)
HDL: 65.1 mg/dL (ref >39.00)
LDL CALC: 96 mg/dL (ref 0–99)
NonHDL: 116.92
Triglycerides: 104 mg/dL (ref 0.0–149.0)
VLDL: 20.8 mg/dL (ref 0.0–40.0)

## 2017-05-17 LAB — COMPREHENSIVE METABOLIC PANEL
ALT: 10 U/L (ref 0–35)
AST: 13 U/L (ref 0–37)
Albumin: 4.1 g/dL (ref 3.5–5.2)
Alkaline Phosphatase: 70 U/L (ref 39–117)
BILIRUBIN TOTAL: 0.3 mg/dL (ref 0.2–1.2)
BUN: 15 mg/dL (ref 6–23)
CALCIUM: 9.4 mg/dL (ref 8.4–10.5)
CO2: 29 meq/L (ref 19–32)
CREATININE: 0.6 mg/dL (ref 0.40–1.20)
Chloride: 106 mEq/L (ref 96–112)
GFR: 133.67 mL/min (ref >60.00)
GLUCOSE: 87 mg/dL (ref 70–99)
Potassium: 3.7 mEq/L (ref 3.5–5.1)
Sodium: 140 mEq/L (ref 135–145)
Total Protein: 7 g/dL (ref 6.0–8.3)

## 2017-05-17 LAB — CBC
HCT: 38.4 % (ref 36.0–46.0)
Hemoglobin: 13.1 g/dL (ref 12.0–15.0)
MCHC: 34.2 g/dL (ref 30.0–36.0)
MCV: 89.3 fl (ref 78.0–100.0)
Platelets: 276 10*3/uL (ref 150.0–400.0)
RBC: 4.3 Mil/uL (ref 3.87–5.11)
RDW: 14.9 % (ref 11.5–15.5)
WBC: 6.2 10*3/uL (ref 4.0–10.5)

## 2017-05-17 NOTE — Patient Instructions (Signed)
Nice to see you. We will get lab work and an x-ray today.  Please try to stay well-hydrated.  If your lightheadedness does not improve please let us know.

## 2017-05-17 NOTE — Assessment & Plan Note (Signed)
Chronic issue.  Benign exam.  Suspect arthritis.  Will check an x-ray.

## 2017-05-17 NOTE — Assessment & Plan Note (Signed)
Labs have improved following ablation.  She will continue to follow with endocrinology.

## 2017-05-17 NOTE — Assessment & Plan Note (Addendum)
Well-controlled.  Occasional lightheadedness.  Negative orthostatics.  Suspect that was related to dehydration as it improved with sitting down and drinking water.  We will check lab work.  She will continue her current blood pressure medication.

## 2017-05-17 NOTE — Progress Notes (Signed)
Tommi Rumps, MD Phone: [phone]  Alyssa Matthews is a 57 y.o. female who presents today for f/u.  HYPERTENSION  Disease Monitoring  Home BP Monitoring not checking Chest pain- no    Dyspnea- no Medications  Compliance-  Taking amlodpine. Lightheadedness-  yes  Edema- no  She notes on a couple of occasions she has gotten lightheaded while standing up at work.  Notes she stopped and started and became a little lightheaded.  She was not seated.  Her head was down though she was not bent over.  No syncope.  She sat down and drank some water and this improved.  Has not recurred. She drinks one bottle of water daily.  No palpitations with this.  She saw endocrinology for her subclinical hyperthyroidism and found to have multinodular toxic goiter.  She underwent ablation and has been doing well since then.  Her weight has been stable.  She was fighting off a cold when she saw the endocrinologist.  Her appetite was poor.  She got rid of the cold symptoms and her appetite has improved.  Tobacco abuse: She has been cutting back.  She is thinking about quitting cold [place] or using the patches.  She has been unable to get Chantix approved in the past.  Smoked a pack per day for about 30 years.  Patient reports continued left shoulder discomfort usually only bothers her when it is cold outside or when she is used it a lot at work.  No exertional symptoms.  Does lift a lot of things at work.  Heating pad helps.  Tylenol as well.   Social History   Tobacco Use  Smoking Status Current Every Day Smoker  . Packs/day: 0.75  . Years: 30.00  . Pack years: 22.50  . Types: Cigarettes  Smokeless Tobacco Never Used     ROS see history of present illness  Objective  Physical Exam Vitals:   05/17/17 0927  BP: 108/68  Pulse: 77  Temp: 98.2 F (36.8 C)  SpO2: 98%   Laying blood pressure 112/70 pulse 67 Sitting blood pressure 112/80 pulse 70 Standing blood pressure 116/78 pulse  73  BP Readings from Last 3 Encounters:  05/17/17 108/68  04/24/17 120/73  02/16/17 110/78   Wt Readings from Last 3 Encounters:  05/17/17 144 lb 12.8 oz (65.7 kg)  04/24/17 144 lb 3.2 oz (65.4 kg)  02/16/17 144 lb 12.8 oz (65.7 kg)    Physical Exam  Constitutional: No distress.  Cardiovascular: Normal rate, regular rhythm and normal heart sounds.  Pulmonary/Chest: Effort normal and breath sounds normal.  Musculoskeletal: She exhibits no edema.  Bilateral shoulders are symmetric, no tenderness, full range of motion active and passive with no discomfort, negative empty can, negative speeds  Neurological: She is alert. Gait normal.  Skin: Skin is warm and dry. She is not diaphoretic.     Assessment/Plan: Please see individual problem list.  Essential hypertension Well-controlled.  Occasional lightheadedness.  Negative orthostatics.  Suspect that was related to dehydration as it improved with sitting down and drinking water.  We will check lab work.  She will continue her current blood pressure medication.  Subclinical hyperthyroidism Labs have improved following ablation.  She will continue to follow with endocrinology.  URI (upper respiratory infection) Suspect viral illness.  Symptoms have resolved.  Appetite is improved.  She will monitor for recurrence.  Tobacco abuse She was unable to get Chantix.  She will try patches or cold [place].  Chronic left shoulder pain  Chronic issue.  Benign exam.  Suspect arthritis.  Will check an x-ray.   Orders Placed This Encounter  Procedures  . DG Shoulder Left    Standing Status:   Future    Standing Expiration Date:   07/18/2018    Order Specific Question:   Reason for Exam (SYMPTOM  OR DIAGNOSIS REQUIRED)    Answer:   chronic left shoulde pain    Order Specific Question:   Is patient pregnant?    Answer:   No    Order Specific Question:   Preferred imaging location?    Answer:   [facility] Specific  Question:   Radiology Contrast Protocol - do NOT remove file path    Answer:   [email or web address].pdf  . Comp Met (CMET)  . CBC  . Lipid panel    No orders of the defined types were placed in this encounter.    Tommi Rumps, MD [place]

## 2017-05-17 NOTE — Assessment & Plan Note (Signed)
Suspect viral illness.  Symptoms have resolved.  Appetite is improved.  She will monitor for recurrence.

## 2017-05-17 NOTE — Assessment & Plan Note (Signed)
She was unable to get Chantix.  She will try patches or cold [place].

## 2017-05-29 ENCOUNTER — Ambulatory Visit
Admission: RE | Admit: 2017-05-29 | Discharge: 2017-05-29 | Disposition: A | Discharge status: Home / Self Care | Payer: 59 | Source: Ambulatory Visit | Attending: Family Medicine | Admitting: Family Medicine

## 2017-05-29 DIAGNOSIS — Z1231 Encounter for screening mammogram for malignant neoplasm of breast: Secondary | ICD-10-CM | POA: Diagnosis not present

## 2017-06-19 ENCOUNTER — Other Ambulatory Visit: Payer: 59

## 2017-06-21 ENCOUNTER — Ambulatory Visit: Payer: 59 | Admitting: Endocrinology

## 2017-08-12 DIAGNOSIS — G459 Transient cerebral ischemic attack, unspecified: Secondary | ICD-10-CM

## 2017-08-12 HISTORY — DX: Transient cerebral ischemic attack, unspecified: G45.9

## 2017-08-14 ENCOUNTER — Other Ambulatory Visit: Payer: 59

## 2017-08-21 ENCOUNTER — Ambulatory Visit (INDEPENDENT_AMBULATORY_CARE_PROVIDER_SITE_OTHER): Payer: 59 | Admitting: Endocrinology

## 2017-08-21 ENCOUNTER — Encounter: Payer: Self-pay | Admitting: Endocrinology

## 2017-08-21 VITALS — BP 120/70 | HR 70 | Ht 64.0 in | Wt 144.6 lb

## 2017-08-21 DIAGNOSIS — E0520 Thyrotoxicosis with toxic multinodular goiter without thyrotoxic crisis or storm: Secondary | ICD-10-CM | POA: Diagnosis not present

## 2017-08-21 LAB — T4, FREE: FREE T4: 0.81 ng/dL (ref 0.60–1.60)

## 2017-08-21 LAB — T3, FREE: T3, Free: 3.6 pg/mL (ref 2.3–4.2)

## 2017-08-21 LAB — TSH: TSH: 0.4 u[IU]/mL (ref 0.35–4.50)

## 2017-08-21 NOTE — Progress Notes (Signed)
Please call to let patient know that the lab results are normal and no further action needed

## 2017-08-21 NOTE — Progress Notes (Signed)
Patient ID: Alyssa Matthews, female   DOB: 02/10/1961, 57 y.o.   MRN: [id number]                                                                                                                 Referring physician: Caryl Bis  Chief complaint: Follow-up of thyroid   History of Present Illness:   Background history obtained on the initial consultation: She was seen by her PCP for general physical exam and had TSH done as a screening test Although the patient says that she feels fine and has no physical symptoms she was reporting that she was getting out of breath on climbing stairs to her PCP Also for an uncertain period of time occasionally she feels her heart beating fast but mostly when she is nervous or tense She has not had any weight loss She does not think she has any shakiness, feeling excessively warm and sweaty, nervousness, and fatigue.  She tends to feel hot and cold alternately and this has been going on for some time  RECENT history:  She was found to have  toxic nodular goiter on her initial exam  Subsequently her free T3 levels had been fluctuating but on her visit in October 2018 it was high normal at 4.0 She had also had some complaints of weight loss which was not explain otherwise  She received RADIOACTIVE IODINE treatment of 29.5 mCi in 03/2017 Although she had a low uptake of 9.5% she had an overactive nodule on the left side on the scan  She has had improved thyroid functions on follow-up Her weight has leveled off  Currently not complaining of any unusual fatigue, heat or cold intolerance or palpitations She has not had labs done  Wt Readings from Last 3 Encounters:  08/21/17 144 lb 9.6 oz (65.6 kg)  05/17/17 144 lb 12.8 oz (65.7 kg)  04/24/17 144 lb 3.2 oz (65.4 kg)      Thyroid function tests as follows:     Lab Results  Component Value Date   FREET4 0.73 04/17/2017   FREET4 0.79 12/26/2016   FREET4 0.72 07/18/2016   T3FREE 3.4 04/17/2017   T3FREE 4.0 12/26/2016   T3FREE 3.6 07/18/2016   TSH 0.33 (L) 04/17/2017   TSH 0.01 (L) 12/26/2016   TSH 0.07 (L) 07/18/2016    Lab Results  Component Value Date   THYROTRECAB 0.62 05/26/2016     Allergies as of 08/21/2017   No Known Allergies     Medication List        Accurate as of 08/21/17  9:28 AM. Always use your most recent med list.          amLODipine 10 MG tablet Commonly known as:  NORVASC TAKE 1 TABLET (10 MG TOTAL) BY MOUTH DAILY.           Past Medical History:  Diagnosis Date  . History of blood transfusion   . Hypercholesteremia   . Hypertension   . Wears dentures    partial upper  Past Surgical History:  Procedure Laterality Date  . BREAST CYST ASPIRATION  2015  . COLONOSCOPY WITH PROPOFOL N/A 03/30/2015   Procedure: COLONOSCOPY WITH PROPOFOL;  Surgeon: Lucilla Lame, MD;  Location: [place];  Service: Endoscopy;  Laterality: N/A;  . DILATION AND CURETTAGE OF UTERUS    . POLYPECTOMY  03/30/2015   Procedure: POLYPECTOMY;  Surgeon: Lucilla Lame, MD;  Location: [place];  Service: Endoscopy;;    Family History  Problem Relation Age of Onset  . Hypertension Mother   . Kidney disease Brother   . Cancer Father        In neck  . Drug abuse Paternal Grandmother   . Hypertension Brother   . Breast cancer Neg Hx   . Thyroid disease Neg Hx     Social History:  reports that she has been smoking cigarettes.  She has a 22.50 pack-year smoking history. She has never used smokeless tobacco. She reports that she drinks about 0.6 oz of alcohol per week. She reports that she does not use drugs.  Allergies: No Known Allergies   Review of Systems  She takes Norvasc 10 mg for hypertension from her PCP   Examination:   BP 120/70 (BP Location: Left Arm, Patient Position: Sitting, Cuff Size: Normal)   Pulse 70   Ht 5\' 4"  (1.626 m)   Wt 144 lb 9.6 oz (65.6 kg)   SpO2 95%   BMI 24.82 kg/m   She looks well The thyroid is enlarged  on the left side about 2 times normal, this is very firm and smooth Right side is enlarged about twice normal and not as firm   Biceps reflexes appear normal    Assessment/Plan:  TOXIC NODULE with multinodular goiter  She had subclinical hyperthyroidism with upper normal T3 levels; may have had some weight loss from mild hyperthyroidism  Because of her weight loss and persistently undetectable TSH she has been treated with I-131  Her left thyroid enlargement is less prominent than before No symptoms suggestive of hypothyroidism currently  She does need to have labs done Further follow-up in 6 months unless her thyroid levels are abnormal  Elayne Snare 08/21/2017, 9:28 AM   ADDENDUM: Thyroid levels are normal, she will continue follow-up in 6 months  Lab Results  Component Value Date   TSH 0.40 08/21/2017   TSH 0.33 (L) 04/17/2017   TSH 0.01 (L) 12/26/2016   FREET4 0.81 08/21/2017   FREET4 0.73 04/17/2017   FREET4 0.79 12/26/2016

## 2017-08-30 ENCOUNTER — Observation Stay
Admission: EM | Admit: 2017-08-30 | Discharge: 2017-08-31 | Disposition: A | Discharge status: Home / Self Care | Payer: 59 | Attending: Internal Medicine | Admitting: Internal Medicine

## 2017-08-30 ENCOUNTER — Emergency Department: Payer: 59

## 2017-08-30 ENCOUNTER — Other Ambulatory Visit: Payer: Self-pay

## 2017-08-30 ENCOUNTER — Encounter: Payer: Self-pay | Admitting: Emergency Medicine

## 2017-08-30 ENCOUNTER — Ambulatory Visit (INDEPENDENT_AMBULATORY_CARE_PROVIDER_SITE_OTHER)
Admission: EM | Admit: 2017-08-30 | Discharge: 2017-08-30 | Disposition: A | Discharge status: Other Acute Inpatient Hospital | Payer: 59 | Source: Home / Self Care | Attending: Family Medicine | Admitting: Family Medicine

## 2017-08-30 DIAGNOSIS — Z8673 Personal history of transient ischemic attack (TIA), and cerebral infarction without residual deficits: Secondary | ICD-10-CM | POA: Diagnosis present

## 2017-08-30 DIAGNOSIS — R200 Anesthesia of skin: Secondary | ICD-10-CM

## 2017-08-30 DIAGNOSIS — Z8249 Family history of ischemic heart disease and other diseases of the circulatory system: Secondary | ICD-10-CM | POA: Diagnosis not present

## 2017-08-30 DIAGNOSIS — F17210 Nicotine dependence, cigarettes, uncomplicated: Secondary | ICD-10-CM | POA: Insufficient documentation

## 2017-08-30 DIAGNOSIS — Z79899 Other long term (current) drug therapy: Secondary | ICD-10-CM | POA: Insufficient documentation

## 2017-08-30 DIAGNOSIS — R29898 Other symptoms and signs involving the musculoskeletal system: Secondary | ICD-10-CM

## 2017-08-30 DIAGNOSIS — E7800 Pure hypercholesterolemia, unspecified: Secondary | ICD-10-CM | POA: Diagnosis not present

## 2017-08-30 DIAGNOSIS — G459 Transient cerebral ischemic attack, unspecified: Secondary | ICD-10-CM | POA: Diagnosis not present

## 2017-08-30 DIAGNOSIS — Z720 Tobacco use: Secondary | ICD-10-CM | POA: Diagnosis not present

## 2017-08-30 DIAGNOSIS — I10 Essential (primary) hypertension: Secondary | ICD-10-CM | POA: Insufficient documentation

## 2017-08-30 LAB — URINALYSIS, COMPLETE (UACMP) WITH MICROSCOPIC
BILIRUBIN URINE: NEGATIVE
Glucose, UA: NEGATIVE mg/dL
Ketones, ur: NEGATIVE mg/dL
Leukocytes, UA: NEGATIVE
NITRITE: NEGATIVE
PH: 6 (ref 5.0–8.0)
Protein, ur: NEGATIVE mg/dL
Specific Gravity, Urine: 1.004 — ABNORMAL LOW (ref 1.005–1.030)

## 2017-08-30 LAB — CBC
HCT: 35.6 % (ref 35.0–47.0)
HEMOGLOBIN: 12.8 g/dL (ref 12.0–16.0)
MCH: 32.3 pg (ref 26.0–34.0)
MCHC: 36 g/dL (ref 32.0–36.0)
MCV: 89.9 fL (ref 80.0–100.0)
Platelets: 263 10*3/uL (ref 150–440)
RBC: 3.96 MIL/uL (ref 3.80–5.20)
RDW: 14.3 % (ref 11.5–14.5)
WBC: 5.3 10*3/uL (ref 3.6–11.0)

## 2017-08-30 LAB — COMPREHENSIVE METABOLIC PANEL
ALT: 12 U/L — ABNORMAL LOW (ref 14–54)
AST: 17 U/L (ref 15–41)
Albumin: 4 g/dL (ref 3.5–5.0)
Alkaline Phosphatase: 69 U/L (ref 38–126)
Anion gap: 7 (ref 5–15)
BUN: 13 mg/dL (ref 6–20)
CO2: 24 mmol/L (ref 22–32)
CREATININE: 0.58 mg/dL (ref 0.44–1.00)
Calcium: 8.9 mg/dL (ref 8.9–10.3)
Chloride: 106 mmol/L (ref 101–111)
GFR calc non Af Amer: >60 mL/min (ref >60)
Glucose, Bld: 117 mg/dL — ABNORMAL HIGH (ref 65–99)
Potassium: 3.3 mmol/L — ABNORMAL LOW (ref 3.5–5.1)
SODIUM: 137 mmol/L (ref 135–145)
Total Bilirubin: 0.3 mg/dL (ref 0.3–1.2)
Total Protein: 7.3 g/dL (ref 6.5–8.1)

## 2017-08-30 MED ORDER — NICOTINE 21 MG/24HR TD PT24
21.0000 mg | MEDICATED_PATCH | Freq: Once | TRANSDERMAL | Status: DC
  Administered 2017-08-30: 21 mg via TRANSDERMAL
  Filled 2017-08-30: qty 1

## 2017-08-30 MED ORDER — ASPIRIN 81 MG PO CHEW
324.0000 mg | CHEWABLE_TABLET | Freq: Once | ORAL | Status: AC
  Administered 2017-08-30: 324 mg via ORAL
  Filled 2017-08-30: qty 4

## 2017-08-30 NOTE — ED Notes (Signed)
Pt feels all numbness has resolved. L eye still feels a bit weak. She can close eyes tight and open on command

## 2017-08-30 NOTE — ED Notes (Signed)
Attempted to call report to floor, primary RN unavailable at this time. Was informed RN would return call as soon as able.

## 2017-08-30 NOTE — ED Triage Notes (Signed)
Patient c/o numbness on the left side of her face that started around 1700 today. She also states her left leg feels weak.

## 2017-08-30 NOTE — Discharge Instructions (Signed)
Go straight to the hospital. ? ?Take care ? ?Dr. Ileana Chalupa  ?

## 2017-08-30 NOTE — ED Provider Notes (Signed)
[facility]    CSN: [id number] Arrival date & time: 08/30/17  1823  History   Chief Complaint Chief Complaint  Patient presents with  . Weakness  . Numbness   HPI  57 year old female presents with left facial numbness as well as left leg weakness.  Patient states that her symptoms started abruptly around 6 PM per her report.  She reports left-sided facial numbness which has now resolved.  She reports that her left leg feels weak.  She states that while at work she felt unsteady on her feet.  No facial droop.  No difficulty with speech.  No reports of upper extremity weakness.  No reports of paresthesias.  No other associated symptoms.  No known exacerbating relieving factors.  No other complaints.  Past Medical History:  Diagnosis Date  . History of blood transfusion   . Hypercholesteremia   . Hypertension   . Wears dentures    partial upper   Patient Active Problem List   Diagnosis Date Noted  . Chronic left shoulder pain 05/17/2017  . Sebaceous cyst of labia 12/05/2016  . Vulvar lesion 10/31/2016  . Rib contusion, left, initial encounter 06/28/2016  . Subclinical hyperthyroidism 06/28/2016  . Dyspnea on exertion 04/18/2016  . Elevated LDL cholesterol level 06/29/2015  . Bilateral numbness of feet 06/29/2015  . Tobacco abuse 05/18/2015  . Prediabetes 05/18/2015  . Lipoma 04/16/2015  . Benign neoplasm of sigmoid colon   . Shoulder pain, bilateral 01/15/2015  . Essential hypertension 01/01/2015   Past Surgical History:  Procedure Laterality Date  . BREAST CYST ASPIRATION  2015  . COLONOSCOPY WITH PROPOFOL N/A 03/30/2015   Procedure: COLONOSCOPY WITH PROPOFOL;  Surgeon: Lucilla Lame, MD;  Location: [place];  Service: Endoscopy;  Laterality: N/A;  . DILATION AND CURETTAGE OF UTERUS    . POLYPECTOMY  03/30/2015   Procedure: POLYPECTOMY;  Surgeon: Lucilla Lame, MD;  Location: [place];  Service: Endoscopy;;    OB History    Gravida  4     Para  2   Term  2   Preterm      AB  1   Living  2     SAB  1   TAB      Ectopic      Multiple      Live Births             Home Medications    Prior to Admission medications   Medication Sig Start Date End Date Taking? Authorizing Provider  amLODipine (NORVASC) 10 MG tablet TAKE 1 TABLET (10 MG TOTAL) BY MOUTH DAILY. 04/19/17  Yes Leone Haven, MD    Family History Family History  Problem Relation Age of Onset  . Hypertension Mother   . Kidney disease Brother   . Cancer Father        In neck  . Drug abuse Paternal Grandmother   . Hypertension Brother   . Breast cancer Neg Hx   . Thyroid disease Neg Hx     Social History Social History   Tobacco Use  . Smoking status: Current Every Day Smoker    Packs/day: 0.75    Years: 30.00    Pack years: 22.50    Types: Cigarettes  . Smokeless tobacco: Never Used  Substance Use Topics  . Alcohol use: Yes    Alcohol/week: 0.6 oz    Types: 1 Cans of beer per week    Comment: occ.  . Drug  use: No     Allergies   Patient has no known allergies.   Review of Systems Review of Systems  Constitutional: Negative.   Neurological: Positive for weakness and numbness.   Physical Exam Triage Vital Signs ED Triage Vitals  Enc Vitals Group     BP 08/30/17 1840 122/72     Pulse Rate 08/30/17 1840 67     Resp 08/30/17 1840 18     Temp 08/30/17 1840 98.8 F (37.1 C)     Temp Source 08/30/17 1840 Oral     SpO2 08/30/17 1840 99 %     Weight 08/30/17 1838 145 lb (65.8 kg)     Height 08/30/17 1838 5\' 5"  (1.651 m)     Head Circumference --      Peak Flow --      Pain Score 08/30/17 1838 0     Pain Loc --      Pain Edu? --      Excl. in [place]? --    Updated Vital Signs BP 122/72 (BP Location: Left Arm)   Pulse 67   Temp 98.8 F (37.1 C) (Oral)   Resp 18   Ht 5\' 5"  (1.651 m)   Wt 145 lb (65.8 kg)   SpO2 99%   BMI 24.13 kg/m   Physical Exam  Constitutional: She is oriented to person, place, and  time. She appears well-developed. No distress.  HENT:  Head: Normocephalic and atraumatic.  Eyes: Pupils are equal, round, and reactive to light. EOM are normal.  Cardiovascular: Normal rate and regular rhythm.  Pulmonary/Chest: Effort normal and breath sounds normal. She has no wheezes. She has no rales.  Neurological: She is alert and oriented to person, place, and time.  Cranial nerves intact.  No facial droop.  She states that numbness has resolved.  No appreciable weakness on exam.  Muscle strength 5/5 in all extremities.  Romberg negative.  Rapid alternating movements normal.  Finger-to-nose and heel-to-shin normal.  Psychiatric: She has a normal mood and affect. Her behavior is normal.  Nursing note and vitals reviewed.  UC Treatments / Results  Labs (all labs ordered are listed, but only abnormal results are displayed) Labs Reviewed - No data to display  EKG None  Radiology No results found.  Procedures Procedures (including critical care time)  Medications Ordered in UC Medications - No data to display  Initial Impression / Assessment and Plan / UC Course  I have reviewed the triage vital signs and the nursing notes.  Pertinent labs & imaging results that were available during my care of the patient were reviewed by me and considered in my medical decision making (see chart for details).    57 year old female with risk factors presents with left-sided facial numbness and left leg weakness.  Symptoms concerning for stroke especially in setting of risk factors.  Her exam is unremarkable.  I have advised her that she should be cautious and go to the ER for imaging.  Patient in agreement.  Family member will drive her over.  Final Clinical Impressions(s) / UC Diagnoses   Final diagnoses:  Left facial numbness  Left leg weakness     Discharge Instructions     Go straight to the hospital.  Take care  Dr. Lacinda Axon    ED Prescriptions    None     Controlled  Substance Prescriptions [place] Controlled Substance Registry consulted? Not Applicable   Coral Spikes, DO 08/30/17 1918

## 2017-08-30 NOTE — ED Notes (Signed)
No code stroke per Dr. Mariea Clonts

## 2017-08-30 NOTE — ED Triage Notes (Signed)
Pt in with co left sided facial numbness that started at 1730 with left sided leg weakness. States numbness has completely resolved with no weakness.

## 2017-08-30 NOTE — ED Notes (Signed)
Called floor to give report. Primary RN and charge RN both unavailable. Will attempt to recall later

## 2017-08-30 NOTE — ED Provider Notes (Signed)
[facility] Emergency Department Provider Note  ____________________________________________   First MD Initiated Contact with Patient 08/30/17 2136     (approximate)  I have reviewed the triage vital signs and the nursing notes.   HISTORY  Chief Complaint Numbness   HPI Alyssa Matthews is a 57 y.o. female self presents to the emergency department with an episode of slurred speech left facial numbness and left leg weakness that began at 5:30 PM today.  She initially presented to med in urgent care and her symptoms were resolved and she was advised to come to the emergency department.  She does smoke cigarettes and has a past medical history of hypertension.  She has never had a stroke.  She feels back to her baseline.  Her symptoms came on suddenly were constant and went away quickly on their own.  Nothing in particular seemed to make them better or worse.  Past Medical History:  Diagnosis Date  . History of blood transfusion   . Hypercholesteremia   . Hypertension   . Wears dentures    partial upper    Patient Active Problem List   Diagnosis Date Noted  . TIA (transient ischemic attack) 08/30/2017  . Chronic left shoulder pain 05/17/2017  . Sebaceous cyst of labia 12/05/2016  . Vulvar lesion 10/31/2016  . Rib contusion, left, initial encounter 06/28/2016  . Subclinical hyperthyroidism 06/28/2016  . Dyspnea on exertion 04/18/2016  . Elevated LDL cholesterol level 06/29/2015  . Bilateral numbness of feet 06/29/2015  . Tobacco abuse 05/18/2015  . Prediabetes 05/18/2015  . Lipoma 04/16/2015  . Benign neoplasm of sigmoid colon   . Shoulder pain, bilateral 01/15/2015  . Essential hypertension 01/01/2015    Past Surgical History:  Procedure Laterality Date  . BREAST CYST ASPIRATION  2015  . COLONOSCOPY WITH PROPOFOL N/A 03/30/2015   Procedure: COLONOSCOPY WITH PROPOFOL;  Surgeon: Lucilla Lame, MD;  Location: [facility];  Service:  Endoscopy;  Laterality: N/A;  . DILATION AND CURETTAGE OF UTERUS    . POLYPECTOMY  03/30/2015   Procedure: POLYPECTOMY;  Surgeon: Lucilla Lame, MD;  Location: [place];  Service: Endoscopy;;    Prior to Admission medications   Medication Sig Start Date End Date Taking? Authorizing Provider  amLODipine (NORVASC) 10 MG tablet TAKE 1 TABLET (10 MG TOTAL) BY MOUTH DAILY. 04/19/17  Yes Leone Haven, MD  aspirin EC 325 MG EC tablet Take 1 tablet (325 mg total) by mouth daily. 09/01/17   Gladstone Lighter, MD  atorvastatin (LIPITOR) 20 MG tablet Take 1 tablet (20 mg total) by mouth daily at 6 PM. 08/31/17   Gladstone Lighter, MD  nicotine (NICODERM CQ - DOSED IN MG/24 HOURS) 14 mg/24hr patch Place 1 patch (14 mg total) onto the skin daily. 08/31/17 08/31/18  Gladstone Lighter, MD    Allergies Patient has no known allergies.  Family History  Problem Relation Age of Onset  . Hypertension Mother   . Kidney disease Brother   . Cancer Father        In neck  . Drug abuse Paternal Grandmother   . Hypertension Brother   . Breast cancer Neg Hx   . Thyroid disease Neg Hx     Social History Social History   Tobacco Use  . Smoking status: Current Every Day Smoker    Packs/day: 0.75    Years: 30.00    Pack years: 22.50    Types: Cigarettes  . Smokeless tobacco: Never Used  Substance  Use Topics  . Alcohol use: Yes    Alcohol/week: 0.6 oz    Types: 1 Cans of beer per week    Comment: occ.  . Drug use: No    Review of Systems Constitutional: No fever/chills Eyes: No visual changes. ENT: No sore throat. Cardiovascular: Denies chest pain. Respiratory: Denies shortness of breath. Gastrointestinal: No abdominal pain.  No nausea, no vomiting.  No diarrhea.  No constipation. Genitourinary: Negative for dysuria. Musculoskeletal: Negative for back pain. Skin: Negative for rash. Neurological: Positive for focal  weakness   ____________________________________________   PHYSICAL EXAM:  VITAL SIGNS: ED Triage Vitals [08/30/17 1951]  Enc Vitals Group     BP 116/90     Pulse Rate 68     Resp 18     Temp 98.9 F (37.2 C)     Temp Source Oral     SpO2 100 %     Weight 145 lb (65.8 kg)     Height 5\' 5"  (1.651 m)     Head Circumference      Peak Flow      Pain Score 0     Pain Loc      Pain Edu?      Excl. in [place]?     Constitutional: Alert and oriented x4 anxious appearing nontoxic no diaphoresis speaks full clear sentences Eyes: PERRL EOMI. Head: Atraumatic. Nose: No congestion/rhinnorhea. Mouth/Throat: No trismus Neck: No stridor.   Cardiovascular: Normal rate, regular rhythm. Grossly normal heart sounds.  Good peripheral circulation. Respiratory: Normal respiratory effort.  No retractions. Lungs CTAB and moving good air Gastrointestinal: Soft nontender Musculoskeletal: No lower extremity edema   Neurologic:  Normal speech and language. No gross focal neurologic deficits are appreciated. Skin:  Skin is warm, dry and intact. No rash noted. Psychiatric: Mood and affect are normal. Speech and behavior are normal.    ____________________________________________   DIFFERENTIAL includes but not limited to  Stroke, TIA, dissection, metabolic derangement ____________________________________________   LABS (all labs ordered are listed, but only abnormal results are displayed)  Labs Reviewed  COMPREHENSIVE METABOLIC PANEL - Abnormal; Notable for the following components:      Result Value   Potassium 3.3 (*)    Glucose, Bld 117 (*)    ALT 12 (*)    All other components within normal limits  URINALYSIS, COMPLETE (UACMP) WITH MICROSCOPIC - Abnormal; Notable for the following components:   Color, Urine STRAW (*)    APPearance CLEAR (*)    Specific Gravity, Urine 1.004 (*)    Hgb urine dipstick SMALL (*)    Bacteria, UA MANY (*)    All other components within normal limits   LIPID PANEL - Abnormal; Notable for the following components:   LDL Cholesterol 108 (*)    All other components within normal limits  CBC - Abnormal; Notable for the following components:   RDW 14.6 (*)    All other components within normal limits  CBC  HEMOGLOBIN A1C  CREATININE, SERUM  HIV ANTIBODY (ROUTINE TESTING)    Lab work reviewed by me with no acute disease __________________________________________  EKG   ____________________________________________  RADIOLOGY  .Head CT reviewed by me with no acute disease  ____________________________________________   PROCEDURES  Procedure(s) performed: no  Procedures  Critical Care performed: no  Observation: no ____________________________________________   INITIAL IMPRESSION / ASSESSMENT AND PLAN / ED COURSE  Pertinent labs & imaging results that were available during my care of the patient were reviewed by me and  considered in my medical decision making (see chart for details).  The patient arrives neuro intact however her story of her left leg "giving out" along with left-sided numbness and an episode of slurred speech raises concern for TIA.  She is a smoker and hypertensive.  She does not have the ability to have her TIA worked up as an outpatient.  Given aspirin here today and she will require inpatient admission for MRI echocardiogram and neurology consultation.  The patient verbalizes understanding and agree with the plan.  I then discussed with the hospitalist who has graciously agreed to admit the patient to her service.      ____________________________________________   FINAL CLINICAL IMPRESSION(S) / ED DIAGNOSES  Final diagnoses:  TIA (transient ischemic attack)      NEW MEDICATIONS STARTED DURING THIS VISIT:  Discharge Medication List as of 08/31/2017  5:00 PM    START taking these medications   Details  aspirin EC 325 MG EC tablet Take 1 tablet (325 mg total) by mouth daily., Starting Fri  09/01/2017, Normal    atorvastatin (LIPITOR) 20 MG tablet Take 1 tablet (20 mg total) by mouth daily at 6 PM., Starting Thu 08/31/2017, Normal    nicotine (NICODERM CQ - DOSED IN MG/24 HOURS) 14 mg/24hr patch Place 1 patch (14 mg total) onto the skin daily., Starting Thu 08/31/2017, Until Fri 08/31/2018, Normal         Note:  This document was prepared using Dragon voice recognition software and may include unintentional dictation errors.     Darel Hong, MD 09/01/17 [phone]

## 2017-08-31 ENCOUNTER — Other Ambulatory Visit: Payer: Self-pay

## 2017-08-31 ENCOUNTER — Observation Stay (HOSPITAL_BASED_OUTPATIENT_CLINIC_OR_DEPARTMENT_OTHER)
Admit: 2017-08-31 | Discharge: 2017-08-31 | Disposition: A | Discharge status: Home / Self Care | Payer: 59 | Attending: Internal Medicine | Admitting: Internal Medicine

## 2017-08-31 ENCOUNTER — Observation Stay: Payer: 59

## 2017-08-31 DIAGNOSIS — I340 Nonrheumatic mitral (valve) insufficiency: Secondary | ICD-10-CM

## 2017-08-31 DIAGNOSIS — Z720 Tobacco use: Secondary | ICD-10-CM | POA: Diagnosis not present

## 2017-08-31 DIAGNOSIS — G459 Transient cerebral ischemic attack, unspecified: Secondary | ICD-10-CM | POA: Diagnosis not present

## 2017-08-31 DIAGNOSIS — R200 Anesthesia of skin: Secondary | ICD-10-CM | POA: Diagnosis not present

## 2017-08-31 LAB — CBC
HCT: 37.2 % (ref 35.0–47.0)
Hemoglobin: 12.6 g/dL (ref 12.0–16.0)
MCH: 30.8 pg (ref 26.0–34.0)
MCHC: 33.9 g/dL (ref 32.0–36.0)
MCV: 90.6 fL (ref 80.0–100.0)
PLATELETS: 258 10*3/uL (ref 150–440)
RBC: 4.1 MIL/uL (ref 3.80–5.20)
RDW: 14.6 % — ABNORMAL HIGH (ref 11.5–14.5)
WBC: 5.5 10*3/uL (ref 3.6–11.0)

## 2017-08-31 LAB — LIPID PANEL
CHOL/HDL RATIO: 3.4 ratio
Cholesterol: 190 mg/dL (ref 0–200)
HDL: 56 mg/dL (ref >40)
LDL CALC: 108 mg/dL — AB (ref 0–99)
Triglycerides: 129 mg/dL (ref <150)
VLDL: 26 mg/dL (ref 0–40)

## 2017-08-31 LAB — CREATININE, SERUM
CREATININE: 0.49 mg/dL (ref 0.44–1.00)
GFR calc non Af Amer: >60 mL/min (ref >60)

## 2017-08-31 LAB — HEMOGLOBIN A1C
Hgb A1c MFr Bld: 5.5 % (ref 4.8–5.6)
MEAN PLASMA GLUCOSE: 111.15 mg/dL

## 2017-08-31 MED ORDER — POTASSIUM CHLORIDE 20 MEQ PO PACK
20.0000 meq | PACK | Freq: Once | ORAL | Status: AC
  Administered 2017-08-31: 20 meq via ORAL
  Filled 2017-08-31: qty 1

## 2017-08-31 MED ORDER — ACETAMINOPHEN 160 MG/5ML PO SOLN
650.0000 mg | ORAL | Status: DC | PRN
  Filled 2017-08-31: qty 20.3

## 2017-08-31 MED ORDER — ATORVASTATIN CALCIUM 20 MG PO TABS: 20.0000 mg | ORAL_TABLET | Freq: Every day | ORAL | 2 refills | Status: DC

## 2017-08-31 MED ORDER — ACETAMINOPHEN 325 MG PO TABS
650.0000 mg | ORAL_TABLET | ORAL | Status: DC | PRN
  Administered 2017-08-31: 650 mg via ORAL
  Filled 2017-08-31: qty 2

## 2017-08-31 MED ORDER — STROKE: EARLY STAGES OF RECOVERY BOOK
Freq: Once | Status: AC
  Administered 2017-08-31

## 2017-08-31 MED ORDER — ASPIRIN EC 325 MG PO TBEC
325.0000 mg | DELAYED_RELEASE_TABLET | Freq: Every day | ORAL | Status: DC
  Administered 2017-08-31: 325 mg via ORAL
  Filled 2017-08-31: qty 1

## 2017-08-31 MED ORDER — ASPIRIN EC 81 MG PO TBEC: 81.0000 mg | DELAYED_RELEASE_TABLET | Freq: Every day | ORAL | 2 refills | Status: DC

## 2017-08-31 MED ORDER — ATORVASTATIN CALCIUM 20 MG PO TABS: 20.0000 mg | ORAL_TABLET | Freq: Every day | ORAL | Status: DC

## 2017-08-31 MED ORDER — ACETAMINOPHEN 650 MG RE SUPP: 650.0000 mg | RECTAL | Status: DC | PRN

## 2017-08-31 MED ORDER — NICOTINE 14 MG/24HR TD PT24: 14.0000 mg | MEDICATED_PATCH | TRANSDERMAL | 0 refills | Status: DC

## 2017-08-31 MED ORDER — HEPARIN SODIUM (PORCINE) 5000 UNIT/ML IJ SOLN
5000.0000 [IU] | Freq: Three times a day (TID) | INTRAMUSCULAR | Status: DC
  Administered 2017-08-31 (×2): 5000 [IU] via SUBCUTANEOUS
  Filled 2017-08-31 (×2): qty 1

## 2017-08-31 MED ORDER — SODIUM CHLORIDE 0.9 % IV SOLN
Freq: Once | INTRAVENOUS | Status: AC
  Administered 2017-08-31: 01:00:00 via INTRAVENOUS

## 2017-08-31 MED ORDER — AMLODIPINE BESYLATE 5 MG PO TABS: 5.0000 mg | ORAL_TABLET | Freq: Every day | ORAL | Status: DC

## 2017-08-31 MED ORDER — ASPIRIN 325 MG PO TBEC: 325.0000 mg | DELAYED_RELEASE_TABLET | Freq: Every day | ORAL | 2 refills | Status: DC

## 2017-08-31 NOTE — Progress Notes (Signed)
Discharge instructions along with home medications and follow up gone over with patient and husband. Both verbalize that they understood instructions. No prescriptions given to patient. IV and tele removed. Pt being discharged home on room air, no distress noted. Ammie Dalton, RN

## 2017-08-31 NOTE — Evaluation (Signed)
Occupational Therapy Evaluation Patient Details Name: Alyssa Matthews MRN: [id number] DOB: 1960/10/14 Today's Date: 08/31/2017    History of Present Illness 57 y.o. female pt with a PMHx significant for HTN, HLD, and tobacco abuse. Pt presented to ED on 6/19 for L facial numbness LLE weakness, started suddenly 2.5 hours before presenting to ED.  She initially went to urgent care from where she was directed to the hospital. Her symptoms resolved by the time she arrived to urgent care. MRI indicates remote lacunar infarcts in pons, nothing acute.   Clinical Impression   Pt seen for OT evaluation this date. Prior to hospital admission, pt was independent in all aspects of ADL/IADL, mobility, driving, and working full time in [employer] work where she stands for 8-9hrs/day. Pt denies falls history in past 12 months. Pt lives with her spouse in a 2nd story apartment (13 steps w/ R handrail). Currently pt reporting symptoms have resolved but just "doesn't feel quite right yet" and with assessment pt states that her LLE continues to feel "a little weaker". BUE/BLE sensation, strength, coordination, balance, vision, and cognition all WFL and no deficits appreciated upon assessment. Pt demonstrates baseline independence to perform ADL . No skilled OT needs identified. Will sign off. Please re-consult if additional OT needs arise.      Follow Up Recommendations  No OT follow up    Equipment Recommendations  None recommended by OT    Recommendations for Other Services       Precautions / Restrictions Precautions Precautions: Fall Restrictions Weight Bearing Restrictions: No      Mobility Bed Mobility Overal bed mobility: Independent             General bed mobility comments: no difficulty noted  Transfers Overall transfer level: Independent               General transfer comment: no difficulty noted    Balance Overall balance assessment: No apparent balance deficits (not  formally assessed)                                         ADL either performed or assessed with clinical judgement   ADL Overall ADL's : Independent;At baseline                                       General ADL Comments: pt able to perform ADL tasks at baseline functional independence, no deficits noted.     Vision Baseline Vision/History: No visual deficits Patient Visual Report: No change from baseline Vision Assessment?: No apparent visual deficits     Perception     Praxis      Pertinent Vitals/Pain Pain Assessment: No/denies pain     Hand Dominance Right   Extremity/Trunk Assessment Upper Extremity Assessment Upper Extremity Assessment: Overall WFL for tasks assessed(grossly 4+/5 bilaterally, no focal weakness, coordination and sensation intact)   Lower Extremity Assessment Lower Extremity Assessment: Defer to PT evaluation;Overall [facility] for tasks assessed(BLE grossly WFL 4+/5, coordination and sensation intact, pt subjectively reports LLE "feeling a little weaker" but no overt functional deficits noted with mobility or ADL )   Cervical / Trunk Assessment Cervical / Trunk Assessment: Normal   Communication Communication Communication: No difficulties   Cognition Arousal/Alertness: Awake/alert Behavior During Therapy: WFL for tasks assessed/performed Overall Cognitive Status:  Within Functional Limits for tasks assessed                                 General Comments: A&Ox4, follows all commands   General Comments       Exercises     Shoulder Instructions      Home Living Family/patient expects to be discharged to:: Private residence Living Arrangements: Spouse/significant other Available Help at Discharge: Family;Available PRN/intermittently(spouse works) Type of Home: Apartment Home Access: Stairs to enter Technical brewer of Steps: 13 Entrance Stairs-Rails: Right Home Layout: One level      Bathroom Shower/Tub: Teacher, early years/pre: Standard     Home Equipment: None          Prior Functioning/Environment Level of Independence: Independent        Comments: Pt indep with mobility and ADL/IADL including driving and 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 Denies falls in past 12 months.         OT Problem List:        OT Treatment/Interventions:      OT Goals(Current goals can be found in the care plan section) Acute Rehab OT Goals Patient Stated Goal: to go home OT Goal Formulation: All assessment and education complete, DC therapy  OT Frequency:     Barriers to D/C:            Co-evaluation              AM-PAC PT "6 Clicks" Daily Activity     Outcome Measure Help from another person eating meals?: None Help from another person taking care of personal grooming?: None Help from another person toileting, which includes using toliet, bedpan, or urinal?: None Help from another person bathing (including washing, rinsing, drying)?: None Help from another person to put on and taking off regular upper body clothing?: None Help from another person to put on and taking off regular lower body clothing?: None 6 Click Score: 24   End of Session    Activity Tolerance: Patient tolerated treatment well Patient left: in bed;with call bell/phone within reach;Other (comment)(seated EOB with MD in to assess)  OT Visit Diagnosis: Other abnormalities of gait and mobility (R26.89)                Time: 7124-5809 OT Time Calculation (min): 14 min Charges:  OT General Charges $OT Visit: 1 Visit OT Evaluation $OT Eval Low Complexity: 1 Low  Jeni Salles, MPH, MS, OTR/L ascom [phone] 08/31/17, 2:53 PM

## 2017-08-31 NOTE — Evaluation (Signed)
Physical Therapy Evaluation Patient Details Name: Alyssa Matthews MRN: [id number] DOB: Aug 18, 1960 Today's Date: 08/31/2017   History of Present Illness  56 y.o. female here with acute onset L sided weakness. PMHx significant for HTN, HLD, and tobacco abuse. Pt presented to ED on 6/19 for L facial numbness LLE weakness, started suddenly 2.5 hours before presenting to ED.  She initially went to urgent care from where she was directed to the hospital. Her symptoms resolved by the time she arrived to urgent care. MRI indicates remote lacunar infarcts in pons, nothing acute.  Clinical Impression  Pt did well with PT exam and showed good function with all activities and overall did well.  She had no LOBs or safety issues and though she may have been marginally slower that baseline she was safe and efficient with all acts.  Pt does not require further PT intervention on d/c.  Will sign off.    Follow Up Recommendations No PT follow up    Equipment Recommendations  None recommended by PT    Recommendations for Other Services       Precautions / Restrictions Precautions Precautions: Fall Restrictions Weight Bearing Restrictions: No      Mobility  Bed Mobility Overal bed mobility: Independent             General bed mobility comments: no difficulty noted  Transfers Overall transfer level: Independent Equipment used: None             General transfer comment: no issues with getting to standing, able to maintain balance w/o issue  Ambulation/Gait Ambulation/Gait assistance: Independent Gait Distance (Feet): 350 Feet Assistive device: None       General Gait Details: Pt walks with consistent cadence and community appropriate speed.  Reports feeling slightly more limited than normal.  Stairs Stairs: Yes Stairs assistance: Independent Stair Management: One rail Right Number of Stairs: 13 General stair comments: Pt is able to easily negotiate up/down steps with  reciprocal pattern and no issues.  Wheelchair Mobility    Modified Rankin (Stroke Patients Only)       Balance Overall balance assessment: No apparent balance deficits (not formally assessed)(brief SLS, grab object from floor, NBOS eyes closed)                                           Pertinent Vitals/Pain Pain Assessment: No/denies pain    Home Living Family/patient expects to be discharged to:: Private residence Living Arrangements: Spouse/significant other Available Help at Discharge: Family;Available PRN/intermittently Type of Home: Apartment Home Access: Stairs to enter Entrance Stairs-Rails: Right Entrance Stairs-Number of Steps: 13 Home Layout: One level Home Equipment: None      Prior Function Level of Independence: Independent         Comments: Pt indep with mobility and ADL/IADL including driving and 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 Denies falls in past 12 months.      Hand Dominance   Dominant Hand: Right    Extremity/Trunk Assessment   Upper Extremity Assessment Upper Extremity Assessment: Overall WFL for tasks assessed    Lower Extremity Assessment Lower Extremity Assessment: Overall WFL for tasks assessed(grossly equal bilaterally )    Cervical / Trunk Assessment Cervical / Trunk Assessment: Normal  Communication   Communication: No difficulties  Cognition Arousal/Alertness: Awake/alert Behavior During Therapy: WFL for tasks assessed/performed Overall Cognitive Status: Within Functional  Limits for tasks assessed                                 General Comments: A&Ox4, follows all commands      General Comments      Exercises     Assessment/Plan    PT Assessment Patent does not need any further PT services  PT Problem List         PT Treatment Interventions      PT Goals (Current goals can be found in the Care Plan section)  Acute Rehab PT Goals Patient Stated  Goal: to go home PT Goal Formulation: All assessment and education complete, DC therapy    Frequency     Barriers to discharge        Co-evaluation               AM-PAC PT "6 Clicks" Daily Activity  Outcome Measure Difficulty turning over in bed (including adjusting bedclothes, sheets and blankets)?: None Difficulty moving from lying on back to sitting on the side of the bed? : None Difficulty sitting down on and standing up from a chair with arms (e.g., wheelchair, bedside commode, etc,.)?: None Help needed moving to and from a bed to chair (including a wheelchair)?: None Help needed walking in hospital room?: None Help needed climbing 3-5 steps with a railing? : None 6 Click Score: 24    End of Session Equipment Utilized During Treatment: Gait belt Activity Tolerance: Patient tolerated treatment well Patient left: with call bell/phone within reach;in bed   PT Visit Diagnosis: Muscle weakness (generalized) (M62.81);Difficulty in walking, not elsewhere classified (R26.2)    Time: 7035-0093 PT Time Calculation (min) (ACUTE ONLY): 22 min   Charges:   PT Evaluation $PT Eval Low Complexity: 1 Low     PT G CodesKreg Shropshire, DPT 08/31/2017, 3:45 PM

## 2017-08-31 NOTE — Progress Notes (Signed)
PT Cancellation Note  Patient Details Name: Alyssa Matthews MRN: [id number] DOB: 03-27-1960   Cancelled Treatment:    Reason Eval/Treat Not Completed: Patient at procedure or test/unavailable Attempted to see pt this AM, going down for MRI.  Will try back later today as pt is available and appropriate.  Kreg Shropshire, DPT 08/31/2017, 10:22 AM

## 2017-08-31 NOTE — Plan of Care (Signed)
  Problem: Education: Goal: Knowledge of disease or condition will improve Outcome: Progressing Goal: Knowledge of secondary prevention will improve Outcome: Progressing Goal: Knowledge of patient specific risk factors addressed and post discharge goals established will improve Outcome: Progressing   Problem: Coping: Goal: Will identify appropriate support needs Outcome: Progressing   Problem: Health Behavior/Discharge Planning: Goal: Ability to manage health-related needs will improve Outcome: Progressing   Problem: Self-Care: Goal: Ability to participate in self-care as condition permits will improve Outcome: Progressing   Problem: Nutrition: Goal: Risk of aspiration will decrease Outcome: Progressing   Problem: Ischemic Stroke/TIA Tissue Perfusion: Goal: Complications of ischemic stroke/TIA will be minimized Outcome: Progressing   Problem: Health Behavior/Discharge Planning: Goal: Ability to manage health-related needs will improve Outcome: Progressing   Problem: Clinical Measurements: Goal: Will remain free from infection Outcome: Progressing Goal: Respiratory complications will improve Outcome: Progressing   Problem: Activity: Goal: Risk for activity intolerance will decrease Outcome: Progressing   Problem: Coping: Goal: Level of anxiety will decrease Outcome: Progressing   Problem: Elimination: Goal: Will not experience complications related to bowel motility Outcome: Progressing   Problem: Pain Managment: Goal: General experience of comfort will improve Outcome: Progressing   Problem: Safety: Goal: Ability to remain free from injury will improve Outcome: Progressing

## 2017-08-31 NOTE — H&P (Signed)
[facility] NAME: Alyssa Matthews    MR#:  [id number]  DATE OF BIRTH:  02-02-1961  DATE OF ADMISSION:  08/30/2017  PRIMARY CARE PHYSICIAN: Leone Haven, MD   REQUESTING/REFERRING PHYSICIAN:   CHIEF COMPLAINT:   Chief Complaint  Patient presents with  . Numbness    HISTORY OF PRESENT ILLNESS: Alyssa Matthews  is a 57 y.o. female with a known history of hypertension, hyperlipidemia and tobacco abuse. Patient presented to emergency room for left facial numbness left leg weakness, started suddenly 2.5 hours before presenting to emergency room.  She initially went to urgent care from where she was directed to the hospital. Her symptoms resolved by the time she arrived to urgent care.  She currently feels tired but she denies any neurologic deficit.  She denies any fever or chills, no new medications, no  ETOH/illicit drug use. Blood test done emergency room, including CBC and CMP are grossly unremarkable. CT of the head without contrast, reviewed by myself, is negative for any acute findings. Patient is admitted as observation, for further evaluation, to rule out TIA/stroke.  PAST MEDICAL HISTORY:   Past Medical History:  Diagnosis Date  . History of blood transfusion   . Hypercholesteremia   . Hypertension   . Wears dentures    partial upper    PAST SURGICAL HISTORY:  Past Surgical History:  Procedure Laterality Date  . BREAST CYST ASPIRATION  2015  . COLONOSCOPY WITH PROPOFOL N/A 03/30/2015   Procedure: COLONOSCOPY WITH PROPOFOL;  Surgeon: Lucilla Lame, MD;  Location: [place];  Service: Endoscopy;  Laterality: N/A;  . DILATION AND CURETTAGE OF UTERUS    . POLYPECTOMY  03/30/2015   Procedure: POLYPECTOMY;  Surgeon: Lucilla Lame, MD;  Location: [place];  Service: Endoscopy;;    SOCIAL HISTORY:  Social History   Tobacco Use  . Smoking status: Current Every Day Smoker    Packs/day: 0.75     Years: 30.00    Pack years: 22.50    Types: Cigarettes  . Smokeless tobacco: Never Used  Substance Use Topics  . Alcohol use: Yes    Alcohol/week: 0.6 oz    Types: 1 Cans of beer per week    Comment: occ.    FAMILY HISTORY:  Family History  Problem Relation Age of Onset  . Hypertension Mother   . Kidney disease Brother   . Cancer Father        In neck  . Drug abuse Paternal Grandmother   . Hypertension Brother   . Breast cancer Neg Hx   . Thyroid disease Neg Hx     DRUG ALLERGIES: No Known Allergies  REVIEW OF SYSTEMS:   CONSTITUTIONAL: No fever, fatigue or weakness.  EYES: No blurred or double vision.  EARS, NOSE, AND THROAT: No tinnitus or ear pain.  RESPIRATORY: No cough, shortness of breath, wheezing or hemoptysis.  CARDIOVASCULAR: No chest pain, orthopnea, edema.  GASTROINTESTINAL: No nausea, vomiting, diarrhea or abdominal pain.  GENITOURINARY: No dysuria, hematuria.  ENDOCRINE: No polyuria, nocturia,  HEMATOLOGY: No anemia, easy bruising or bleeding SKIN: No rash or lesion. MUSCULOSKELETAL: No joint pain or arthritis.   NEUROLOGIC: Positive for left facial numbness and left leg weakness.  PSYCHIATRY: No anxiety or depression.   MEDICATIONS AT HOME:  Prior to Admission medications   Medication Sig Start Date End Date Taking? Authorizing Provider  amLODipine (NORVASC) 10 MG tablet TAKE 1 TABLET (10 MG TOTAL) BY  MOUTH DAILY. 04/19/17  Yes Leone Haven, MD      PHYSICAL EXAMINATION:   VITAL SIGNS: Blood pressure (!) 141/75, pulse (!) 56, temperature 98.9 F (37.2 C), resp. rate 18, height 5\' 5"  (1.651 m), weight 65.8 kg (145 lb), SpO2 99 %.  GENERAL:  58 y.o.-year-old patient lying in the bed with no acute distress.  EYES: Pupils equal, round, reactive to light and accommodation. No scleral icterus. Extraocular muscles intact.  HEENT: Head atraumatic, normocephalic. Oropharynx and nasopharynx clear.  NECK:  Supple, no jugular venous distention. No  thyroid enlargement, no tenderness.  LUNGS: Normal breath sounds bilaterally, no wheezing, rales,rhonchi or crepitation. No use of accessory muscles of respiration.  CARDIOVASCULAR: S1, S2 normal. No murmurs, rubs, or gallops.  ABDOMEN: Soft, nontender, nondistended. Bowel sounds present. No organomegaly or mass.  EXTREMITIES: No pedal edema, cyanosis, or clubbing.  NEUROLOGIC: Cranial nerves II through XII are intact. Muscle strength 5/5 in all extremities. Sensation intact.  No speech difficulty.  Gait is stable.  PSYCHIATRIC: The patient is alert and oriented x 3.  SKIN: No obvious rash, lesion, or ulcer.   LABORATORY PANEL:   CBC Recent Labs  Lab 08/30/17 1957  WBC 5.3  HGB 12.8  HCT 35.6  PLT 263  MCV 89.9  MCH 32.3  MCHC 36.0  RDW 14.3   ------------------------------------------------------------------------------------------------------------------  Chemistries  Recent Labs  Lab 08/30/17 1957  NA 137  K 3.3*  CL 106  CO2 24  GLUCOSE 117*  BUN 13  CREATININE 0.58  CALCIUM 8.9  AST 17  ALT 12*  ALKPHOS 69  BILITOT 0.3   ------------------------------------------------------------------------------------------------------------------ estimated creatinine clearance is 72.3 mL/min (by C-G formula based on SCr of 0.58 mg/dL). ------------------------------------------------------------------------------------------------------------------ No results for input(s): TSH, T4TOTAL, T3FREE, THYROIDAB in the last 72 hours.  Invalid input(s): FREET3   Coagulation profile No results for input(s): INR, PROTIME in the last 168 hours. ------------------------------------------------------------------------------------------------------------------- No results for input(s): DDIMER in the last 72 hours. -------------------------------------------------------------------------------------------------------------------  Cardiac Enzymes No results for input(s): CKMB,  TROPONINI, MYOGLOBIN in the last 168 hours.  Invalid input(s): CK ------------------------------------------------------------------------------------------------------------------ Invalid input(s): POCBNP  ---------------------------------------------------------------------------------------------------------------  Urinalysis    Component Value Date/Time   COLORURINE STRAW (A) 08/30/2017 1957   APPEARANCEUR CLEAR (A) 08/30/2017 1957   LABSPEC 1.004 (L) 08/30/2017 1957   PHURINE 6.0 08/30/2017 1957   GLUCOSEU NEGATIVE 08/30/2017 1957   HGBUR SMALL (A) 08/30/2017 1957   BILIRUBINUR NEGATIVE 08/30/2017 [place] NEGATIVE 08/30/2017 1957   PROTEINUR NEGATIVE 08/30/2017 1957   NITRITE NEGATIVE 08/30/2017 1957   LEUKOCYTESUR NEGATIVE 08/30/2017 1957     RADIOLOGY: Ct Head Wo Contrast  Result Date: 08/30/2017 CLINICAL DATA:  LEFT facial numbness beginning at 1730 hours, now resolved. LEFT leg weakness. History of hypertension and hypercholesterolemia. EXAM: CT HEAD WITHOUT CONTRAST TECHNIQUE: Contiguous axial images were obtained from the base of the skull through the vertex without intravenous contrast. COMPARISON:  CT HEAD December 11, 2013 FINDINGS: BRAIN: No intraparenchymal hemorrhage, mass effect nor midline shift. The ventricles and sulci are normal. No acute large vascular territory infarcts. No abnormal extra-axial fluid collections. Basal cisterns are patent. VASCULAR: Mild calcific atherosclerosis carotid siphons. SKULL/SOFT TISSUES: No skull fracture. No significant soft tissue swelling. LEFT frontal scalp scarring. ORBITS/SINUSES: The included ocular globes and orbital contents are normal.The mastoid aircells and included paranasal sinuses are well-aerated. OTHER: None. IMPRESSION: Negative noncontrast CT HEAD. Electronically Signed   By: Elon Alas M.D.   On: 08/30/2017 20:16  EKG: Orders placed or performed in visit on 01/03/17  . EKG 12-Lead     IMPRESSION AND PLAN:  1.  Left facial numbness and left leg weakness, resolved.  Will rule out TIA/stroke.  Continue to monitor patient clinically closely with frequent neuro checks.  The importance of complying with blood pressure medications and smoking cessation were discussed with patient in detail. 2.  Hypertension, continue home medications. 3.  Hyperlipidemia, will check fasting lipid panel. 4.  Tobacco abuse.  Smoking cessation was discussed with patient in detail.  All the records are reviewed and case discussed with ED provider. Management plans discussed with the patient, family and they are in agreement.  CODE STATUS: FULL   TOTAL TIME TAKING CARE OF THIS PATIENT: 45 minutes.    Amelia Jo M.D on 08/31/2017 at 12:07 AM  Between 7am to 6pm - Pager - [phone]  After 6pm go to [email or web address] - password [email or web address] [facility]  [facility]  Office  [phone]  CC: Primary care physician; Leone Haven, MD

## 2017-08-31 NOTE — Progress Notes (Signed)
SLP Cancellation Note  Patient Details Name: Alyssa Matthews MRN: [id number] DOB: 06-30-1960   Cancelled treatment:        Patient is at her baseline, no speech, language, cognitive or swallowing deficits.  Patient told that is she feels that she is having trouble in any of these areas post discharge, she should speak with her doctor about referral to outpatient speech therapy.  Leroy Sea, MS/CCC- SLP  Lou Miner 08/31/2017, 3:23 PM

## 2017-08-31 NOTE — Consult Note (Signed)
Referring Physician: Tressia Miners    Chief Complaint: Left sided weakness and numbness  HPI: Alyssa Matthews is an 57 y.o. female who reports that at work yesterday began to notice that things were not right.  Noted that the left sided of her face did not feel normal and that she was slurring.  When walking noted that she was having difficulty using her left leg.  With no improvement presented for evaluation but by the time of evaluation symptoms had resolved.  Initial NIHSS of 0.  Date last known well: Date: 08/30/2017 Time last known well: Time: 17:00 tPA Given: No: Resolution of symptoms  Past Medical History:  Diagnosis Date  . History of blood transfusion   . Hypercholesteremia   . Hypertension   . Wears dentures    partial upper    Past Surgical History:  Procedure Laterality Date  . BREAST CYST ASPIRATION  2015  . COLONOSCOPY WITH PROPOFOL N/A 03/30/2015   Procedure: COLONOSCOPY WITH PROPOFOL;  Surgeon: Lucilla Lame, MD;  Location: [place];  Service: Endoscopy;  Laterality: N/A;  . DILATION AND CURETTAGE OF UTERUS    . POLYPECTOMY  03/30/2015   Procedure: POLYPECTOMY;  Surgeon: Lucilla Lame, MD;  Location: [place];  Service: Endoscopy;;    Family History  Problem Relation Age of Onset  . Hypertension Mother   . Kidney disease Brother   . Cancer Father        In neck  . Drug abuse Paternal Grandmother   . Hypertension Brother   . Breast cancer Neg Hx   . Thyroid disease Neg Hx    Social History:  reports that she has been smoking cigarettes.  She has a 22.50 pack-year smoking history. She has never used smokeless tobacco. She reports that she drinks about 0.6 oz of alcohol per week. She reports that she does not use drugs.  Allergies: No Known Allergies  Medications:  I have reviewed the patient's current medications. Prior to Admission:  Medications Prior to Admission  Medication Sig Dispense Refill Last Dose  . amLODipine (NORVASC) 10 MG  tablet TAKE 1 TABLET (10 MG TOTAL) BY MOUTH DAILY. 90 tablet 1 08/30/2017 at Unknown time   Scheduled: . amLODipine  5 mg Oral Daily  . aspirin EC  325 mg Oral Daily  . atorvastatin  20 mg Oral q1800  . heparin  5,000 Units Subcutaneous Q8H  . nicotine  21 mg Transdermal Once    ROS: History obtained from the patient  General ROS: negative for - chills, fatigue, fever, night sweats, weight gain or weight loss Psychological ROS: negative for - behavioral disorder, hallucinations, memory difficulties, mood swings or suicidal ideation Ophthalmic ROS: negative for - blurry vision, double vision, eye pain or loss of vision ENT ROS: negative for - epistaxis, nasal discharge, oral lesions, sore throat, tinnitus or vertigo Allergy and Immunology ROS: negative for - hives or itchy/watery eyes Hematological and Lymphatic ROS: negative for - bleeding problems, bruising or swollen lymph nodes Endocrine ROS: negative for - galactorrhea, hair pattern changes, polydipsia/polyuria or temperature intolerance Respiratory ROS: negative for - cough, hemoptysis, shortness of breath or wheezing Cardiovascular ROS: negative for - chest pain, dyspnea on exertion, edema or irregular heartbeat Gastrointestinal ROS: negative for - abdominal pain, diarrhea, hematemesis, nausea/vomiting or stool incontinence Genito-Urinary ROS: negative for - dysuria, hematuria, incontinence or urinary frequency/urgency Musculoskeletal ROS: negative for - joint swelling or muscular weakness Neurological ROS: as noted in HPI Dermatological ROS: negative for rash and  skin lesion changes  Physical Examination: Blood pressure 108/71, pulse 70, temperature 98.3 F (36.8 C), temperature source Oral, resp. rate 18, height 5\' 5"  (1.651 m), weight 65.1 kg (143 lb 8 oz), SpO2 100 %.  HEENT-  Normocephalic, no lesions, without obvious abnormality.  Normal external eye and conjunctiva.  Normal TM's bilaterally.  Normal auditory canals and  external ears. Normal external nose, mucus membranes and septum.  Normal pharynx. Cardiovascular- S1, S2 normal, pulses palpable throughout   Lungs- chest clear, no wheezing, rales, normal symmetric air entry Abdomen- soft, non-tender; bowel sounds normal; no masses,  no organomegaly Extremities- no edema Lymph-no adenopathy palpable Musculoskeletal-no joint tenderness, deformity or swelling Skin-warm and dry, no hyperpigmentation, vitiligo, or suspicious lesions  Neurological Examination   Mental Status: Alert, oriented, thought content appropriate.  Speech fluent without evidence of aphasia.  Able to follow 3 step commands without difficulty. Cranial Nerves: II: Discs flat bilaterally; Visual fields grossly normal, pupils equal, round, reactive to light and accommodation III,IV, VI: ptosis not present, extra-ocular motions intact bilaterally V,VII: smile symmetric, facial light touch sensation normal bilaterally VIII: hearing normal bilaterally IX,X: gag reflex present XI: bilateral shoulder shrug XII: midline tongue extension Motor: Right : Upper extremity   5/5    Left:     Upper extremity   5/5  Lower extremity   5/5     Lower extremity   5/5 Tone and bulk:normal tone throughout; no atrophy noted Sensory: Pinprick and light touch intact throughout, bilaterally Deep Tendon Reflexes: 2+ and symmetric throughout Plantars: Right: downgoing   Left: downgoing Cerebellar: normal finger-to-nose, normal rapid alternating movements and normal heel-to-shin test Gait: normal gait and station    Laboratory Studies:  Basic Metabolic Panel: Recent Labs  Lab 08/30/17 1957 08/31/17 0407  NA 137  --   K 3.3*  --   CL 106  --   CO2 24  --   GLUCOSE 117*  --   BUN 13  --   CREATININE 0.58 0.49  CALCIUM 8.9  --     Liver Function Tests: Recent Labs  Lab 08/30/17 1957  AST 17  ALT 12*  ALKPHOS 69  BILITOT 0.3  PROT 7.3  ALBUMIN 4.0   No results for input(s): LIPASE, AMYLASE  in the last 168 hours. No results for input(s): AMMONIA in the last 168 hours.  CBC: Recent Labs  Lab 08/30/17 1957 08/31/17 0407  WBC 5.3 5.5  HGB 12.8 12.6  HCT 35.6 37.2  MCV 89.9 90.6  PLT 263 258    Cardiac Enzymes: No results for input(s): CKTOTAL, CKMB, CKMBINDEX, TROPONINI in the last 168 hours.  BNP: Invalid input(s): POCBNP  CBG: No results for input(s): GLUCAP in the last 168 hours.  Microbiology: Results for orders placed or performed in visit on 11/04/16  Clostridium Difficile by PCR     Status: None   Collection Time: 11/04/16 10:26 AM  Result Value Ref Range Status   Toxigenic C. Difficile by PCR Not Detected Not Detected Final    Comment: This test is for use only with liquid or soft stools; performance characteristics of other clinical specimen types have not been established.   This assay was performed by Cepheid GeneXpert(R) PCR. The performance characteristics of this assay have been determined by [employer]. Performance characteristics refer to the analytical performance of the test.   Stool Culture     Status: None   Collection Time: 11/04/16 10:26 AM  Result Value Ref Range Status  Organism ID, Bacteria   Final    No Salmonella,Shigella,Campylobacter,Yersinia,or No E.coli 0157:H7 isolated.   Ova and parasite examination     Status: None   Collection Time: 11/04/16 10:26 AM  Result Value Ref Range Status   OP No Ova or Parasites Seen   Final    Coagulation Studies: No results for input(s): LABPROT, INR in the last 72 hours.  Urinalysis:  Recent Labs  Lab 08/30/17 1957  COLORURINE STRAW*  LABSPEC 1.004*  PHURINE 6.0  GLUCOSEU NEGATIVE  HGBUR SMALL*  BILIRUBINUR NEGATIVE  KETONESUR NEGATIVE  PROTEINUR NEGATIVE  NITRITE NEGATIVE  LEUKOCYTESUR NEGATIVE    Lipid Panel:    Component Value Date/Time   CHOL 190 08/31/2017 0407   TRIG 129 08/31/2017 0407   HDL 56 08/31/2017 0407   CHOLHDL 3.4 08/31/2017 0407   VLDL  26 08/31/2017 0407   LDLCALC 108 (H) 08/31/2017 0407    HgbA1C:  Lab Results  Component Value Date   HGBA1C 5.5 08/31/2017    Urine Drug Screen:  No results found for: LABOPIA, COCAINSCRNUR, LABBENZ, AMPHETMU, THCU, LABBARB  Alcohol Level: No results for input(s): ETH in the last 168 hours.   Imaging: Ct Head Wo Contrast  Result Date: 08/30/2017 CLINICAL DATA:  LEFT facial numbness beginning at 1730 hours, now resolved. LEFT leg weakness. History of hypertension and hypercholesterolemia. EXAM: CT HEAD WITHOUT CONTRAST TECHNIQUE: Contiguous axial images were obtained from the base of the skull through the vertex without intravenous contrast. COMPARISON:  CT HEAD December 11, 2013 FINDINGS: BRAIN: No intraparenchymal hemorrhage, mass effect nor midline shift. The ventricles and sulci are normal. No acute large vascular territory infarcts. No abnormal extra-axial fluid collections. Basal cisterns are patent. VASCULAR: Mild calcific atherosclerosis carotid siphons. SKULL/SOFT TISSUES: No skull fracture. No significant soft tissue swelling. LEFT frontal scalp scarring. ORBITS/SINUSES: The included ocular globes and orbital contents are normal.The mastoid aircells and included paranasal sinuses are well-aerated. OTHER: None. IMPRESSION: Negative noncontrast CT HEAD. Electronically Signed   By: Elon Alas M.D.   On: 08/30/2017 20:16   Mr Brain Wo Contrast  Result Date: 08/31/2017 CLINICAL DATA:  TIA, initial exam. Episode of left facial numbness and lower extremity weakness beginning 2.5 hours prior to arrival. Symptoms have since resolved. EXAM: MRI HEAD WITHOUT CONTRAST TECHNIQUE: Multiplanar, multiecho pulse sequences of the brain and surrounding structures were obtained without intravenous contrast. COMPARISON:  CT head without contrast 08/30/17 FINDINGS: Brain: The diffusion-weighted images demonstrate no acute or subacute infarction. No acute hemorrhage or mass lesion is present. White  matter changes are scratched at scattered subcortical T2 hyperintensities are mildly advanced for age. Remote lacunar infarcts are present bilaterally within the pons. No other focal lesions are present in the brainstem or cerebellum. The internal auditory canals are within normal limits. Vascular: Flow is present in the major intracranial arteries. Skull and upper cervical spine: The skull base is within normal limits. Sinuses/Orbits: The paranasal sinuses and mastoid air cells are clear. Globes and orbits are within normal limits. IMPRESSION: 1. Remote lacunar infarcts involving the pons. 2. No acute intracranial abnormality explain the patient's symptoms. Ischemia in the region of the previous infarcts could cause contralateral face and lower extremity symptoms. Electronically Signed   By: San Morelle M.D.   On: 08/31/2017 11:13   US Carotid Bilateral (at [facility] Only)  Result Date: 08/31/2017 CLINICAL DATA:  57 year old female with a history of TIA. Cardiovascular risk factors include hypertension, stroke/TIA, tobacco use EXAM: BILATERAL CAROTID DUPLEX ULTRASOUND  TECHNIQUE: Pearline Cables scale imaging, color Doppler and duplex ultrasound were performed of bilateral carotid and vertebral arteries in the neck. COMPARISON:  None. FINDINGS: Criteria: Quantification of carotid stenosis is based on velocity parameters that correlate the residual internal carotid diameter with NASCET-based stenosis levels, using the diameter of the distal internal carotid lumen as the denominator for stenosis measurement. The following velocity measurements were obtained: RIGHT ICA:  Systolic 99 cm/sec, Diastolic 37 cm/sec CCA:  94 cm/sec SYSTOLIC ICA/CCA RATIO:  1.1 ECA:  64 cm/sec LEFT ICA:  Systolic 88 cm/sec, Diastolic 33 cm/sec CCA:  98 cm/sec SYSTOLIC ICA/CCA RATIO:  0.9 ECA:  53 cm/sec Right Brachial SBP: Not acquired Left Brachial SBP: Not acquired RIGHT CAROTID ARTERY: No significant calcifications of the right common  carotid artery. Intermediate waveform maintained. Heterogeneous and partially calcified plaque at the right carotid bifurcation. No significant lumen shadowing. Low resistance waveform of the right ICA. No significant tortuosity. RIGHT VERTEBRAL ARTERY: Antegrade flow with low resistance waveform. LEFT CAROTID ARTERY: No significant calcifications of the left common carotid artery. Intermediate waveform maintained. Heterogeneous and partially calcified plaque at the left carotid bifurcation without significant lumen shadowing. Low resistance waveform of the left ICA. No significant tortuosity. LEFT VERTEBRAL ARTERY:  Antegrade flow with low resistance waveform. Additional: Incidental left thyroid nodule measures 2.2 cm IMPRESSION: IMPRESSION Color duplex indicates minimal heterogeneous and calcified plaque, with no hemodynamically significant stenosis by duplex criteria in the extracranial cerebrovascular circulation. Incidental left thyroid nodule. Referral for dedicated thyroid ultrasound recommended. Signed, Dulcy Fanny. Dellia Nims, RPVI [facility] Radiology Electronically Signed   By: Corrie Mckusick D.O.   On: 08/31/2017 12:22    Assessment: 57 y.o. female presenting with complaints of left sided numbness and weakness.  Symptoms have resolved.  MRI of the brain reviewed and shows no acute changes but chronic pontine infarcts.  Etiology likely small vessel disease.  Patient on no antiplatelet therapy prior to admission.  Carotid dopplers show no evidence of hemodynamically significant stenosis.  Echocardiogram shows a small PFO with an EF of 60-65%.  A1c 5.5, LDL 108.  Stroke Risk Factors - hyperlipidemia and hypertension  Plan: 1. PT consult, OT consult, Speech consult 2. Prophylactic therapy-Antiplatelet med: Aspirin - dose 325mg  daily 3. Telemetry monitoring 4. Frequent neuro checks 5. Patient to follow up with cardiology and neurology as an outpatient.   Conservative management of PFO appropriate at this time since remainder of vascular risk factors not adequately addressed. 6. Smoking cessation counseling 7. Statin for lipid management with target LDL<70.  Case discussed with Dr. Samuella Cota, MD Neurology [phone] 08/31/2017, 2:49 PM

## 2017-08-31 NOTE — Progress Notes (Signed)
*  PRELIMINARY RESULTS* Echocardiogram 2D Echocardiogram has been performed.  Alyssa Matthews 08/31/2017, 12:30 PM

## 2017-08-31 NOTE — Progress Notes (Signed)
OT Cancellation Note  Patient Details Name: Alyssa Matthews MRN: [id number] DOB: 1960/04/28   Cancelled Treatment:    Reason Eval/Treat Not Completed: Patient at procedure or test/ unavailable. Order received, chart reviewed. Pt out of room for testing. Will re-attempt OT evaluation at later date/time as pt is available and medically appropriate.  Jeni Salles, MPH, MS, OTR/L ascom [phone] 08/31/17, 11:14 AM

## 2017-08-31 NOTE — Discharge Summary (Signed)
[facility] NAME: Alyssa Matthews    MR#:  [id number]  DATE OF BIRTH:  1960/12/09  DATE OF ADMISSION:  08/30/2017   ADMITTING PHYSICIAN: Amelia Jo, MD  DATE OF DISCHARGE:  08/31/17  PRIMARY CARE PHYSICIAN: Leone Haven, MD   ADMISSION DIAGNOSIS:   TIA (transient ischemic attack) [G45.9]  DISCHARGE DIAGNOSIS:   Active Problems:   TIA (transient ischemic attack)   SECONDARY DIAGNOSIS:   Past Medical History:  Diagnosis Date  . History of blood transfusion   . Hypercholesteremia   . Hypertension   . Wears dentures    partial upper    HOSPITAL COURSE:   57 year old female with past medical history significant for hypertension and smoking resents to hospital secondary to left facial and left leg numbness.  1. TIA-has risk factors including smoking and hypertension -LDL at 101. -CT of the head without any acute findings.  MRI of the brain showed remote lacunar infarcts but no acute findings. -Carotid Dopplers with no hemodynamically significant stenosis.  Echocardiogram done with bubble study to rule out any PFO and is pending. - Appreciate neurology consult -Patient not taking any antiplatelet agent at home.  Started on aspirin.  Also started on statin. -PT and OT with no further needs as patient is independent and has no deficits at this time.  2.Hypertension -Continue Norvasc  3.  Tobacco use disorder-strongly counseled.  Patient is motivated to quit.  Discharged on nicotine patches  Patient is otherwise stable.  Will be discharged home today  DISCHARGE CONDITIONS:   Guarded  CONSULTS OBTAINED:   Treatment Team:  Alexis Goodell, MD  DRUG ALLERGIES:   No Known Allergies DISCHARGE MEDICATIONS:   Allergies as of 08/31/2017   No Known Allergies     Medication List    TAKE these medications   amLODipine 10 MG tablet Commonly known as:  NORVASC TAKE 1 TABLET (10 MG TOTAL) BY MOUTH DAILY.   aspirin 325 MG EC tablet Take 1 tablet (325 mg total) by mouth daily. Start taking on:  09/01/2017   atorvastatin 20 MG tablet Commonly known as:  LIPITOR Take 1 tablet (20 mg total) by mouth daily at 6 PM.   nicotine 14 mg/24hr patch Commonly known as:  NICODERM CQ - dosed in mg/24 hours Place 1 patch (14 mg total) onto the skin daily.        DISCHARGE INSTRUCTIONS:   1. PCP f/u in 1-2 weeks  DIET:   Cardiac diet  ACTIVITY:   Activity as tolerated  OXYGEN:   Home Oxygen: No.  Oxygen Delivery: room air  DISCHARGE LOCATION:   home   If you experience worsening of your admission symptoms, develop shortness of breath, life threatening emergency, suicidal or homicidal thoughts you must seek medical attention immediately by calling 911 or calling your MD immediately  if symptoms less severe.  You Must read complete instructions/literature along with all the possible adverse reactions/side effects for all the Medicines you take and that have been prescribed to you. Take any new Medicines after you have completely understood and accpet all the possible adverse reactions/side effects.   Please note  You were cared for by a hospitalist during your hospital stay. If you have any questions about your discharge medications or the care you received while you were in the hospital after you are discharged, you can call the unit and asked to speak with the hospitalist on call if the hospitalist that  took care of you is not available. Once you are discharged, your primary care physician will handle any further medical issues. Please note that NO REFILLS for any discharge medications will be authorized once you are discharged, as it is imperative that you return to your primary care physician (or establish a relationship with a primary care physician if you do not have one) for your aftercare needs so that they can reassess your need for medications and monitor your lab values.    On  the day of Discharge:  VITAL SIGNS:   Blood pressure 108/71, pulse 70, temperature 98.3 F (36.8 C), temperature source Oral, resp. rate 18, height 5\' 5"  (1.651 m), weight 65.1 kg (143 lb 8 oz), SpO2 100 %.  PHYSICAL EXAMINATION:    GENERAL:  57 y.o.-year-old patient lying in the bed with no acute distress.  EYES: Pupils equal, round, reactive to light and accommodation. No scleral icterus. Extraocular muscles intact.  HEENT: Head atraumatic, normocephalic. Oropharynx and nasopharynx clear.  NECK:  Supple, no jugular venous distention. No thyroid enlargement, no tenderness.  LUNGS: Normal breath sounds bilaterally, no wheezing, rales,rhonchi or crepitation. No use of accessory muscles of respiration.  CARDIOVASCULAR: S1, S2 normal. No murmurs, rubs, or gallops.  ABDOMEN: Soft, non-tender, non-distended. Bowel sounds present. No organomegaly or mass.  EXTREMITIES: No pedal edema, cyanosis, or clubbing.  NEUROLOGIC: Cranial nerves II through XII are intact. Muscle strength 5/5 in all extremities. Sensation intact. Gait not checked.  PSYCHIATRIC: The patient is alert and oriented x 3.  SKIN: No obvious rash, lesion, or ulcer.   DATA REVIEW:   CBC Recent Labs  Lab 08/31/17 0407  WBC 5.5  HGB 12.6  HCT 37.2  PLT 258    Chemistries  Recent Labs  Lab 08/30/17 1957 08/31/17 0407  NA 137  --   K 3.3*  --   CL 106  --   CO2 24  --   GLUCOSE 117*  --   BUN 13  --   CREATININE 0.58 0.49  CALCIUM 8.9  --   AST 17  --   ALT 12*  --   ALKPHOS 69  --   BILITOT 0.3  --      Microbiology Results  Results for orders placed or performed in visit on 11/04/16  Clostridium Difficile by PCR     Status: None   Collection Time: 11/04/16 10:26 AM  Result Value Ref Range Status   Toxigenic C. Difficile by PCR Not Detected Not Detected Final    Comment: This test is for use only with liquid or soft stools; performance characteristics of other clinical specimen types have not been  established.   This assay was performed by Cepheid GeneXpert(R) PCR. The performance characteristics of this assay have been determined by [employer]. Performance characteristics refer to the analytical performance of the test.   Stool Culture     Status: None   Collection Time: 11/04/16 10:26 AM  Result Value Ref Range Status   Organism ID, Bacteria   Final    No Salmonella,Shigella,Campylobacter,Yersinia,or No E.coli 0157:H7 isolated.   Ova and parasite examination     Status: None   Collection Time: 11/04/16 10:26 AM  Result Value Ref Range Status   OP No Ova or Parasites Seen   Final    RADIOLOGY:  Ct Head Wo Contrast  Result Date: 08/30/2017 CLINICAL DATA:  LEFT facial numbness beginning at 1730 hours, now resolved. LEFT leg weakness. History of hypertension and hypercholesterolemia.  EXAM: CT HEAD WITHOUT CONTRAST TECHNIQUE: Contiguous axial images were obtained from the base of the skull through the vertex without intravenous contrast. COMPARISON:  CT HEAD December 11, 2013 FINDINGS: BRAIN: No intraparenchymal hemorrhage, mass effect nor midline shift. The ventricles and sulci are normal. No acute large vascular territory infarcts. No abnormal extra-axial fluid collections. Basal cisterns are patent. VASCULAR: Mild calcific atherosclerosis carotid siphons. SKULL/SOFT TISSUES: No skull fracture. No significant soft tissue swelling. LEFT frontal scalp scarring. ORBITS/SINUSES: The included ocular globes and orbital contents are normal.The mastoid aircells and included paranasal sinuses are well-aerated. OTHER: None. IMPRESSION: Negative noncontrast CT HEAD. Electronically Signed   By: Elon Alas M.D.   On: 08/30/2017 20:16   Mr Brain Wo Contrast  Result Date: 08/31/2017 CLINICAL DATA:  TIA, initial exam. Episode of left facial numbness and lower extremity weakness beginning 2.5 hours prior to arrival. Symptoms have since resolved. EXAM: MRI HEAD WITHOUT CONTRAST  TECHNIQUE: Multiplanar, multiecho pulse sequences of the brain and surrounding structures were obtained without intravenous contrast. COMPARISON:  CT head without contrast 08/30/17 FINDINGS: Brain: The diffusion-weighted images demonstrate no acute or subacute infarction. No acute hemorrhage or mass lesion is present. White matter changes are scratched at scattered subcortical T2 hyperintensities are mildly advanced for age. Remote lacunar infarcts are present bilaterally within the pons. No other focal lesions are present in the brainstem or cerebellum. The internal auditory canals are within normal limits. Vascular: Flow is present in the major intracranial arteries. Skull and upper cervical spine: The skull base is within normal limits. Sinuses/Orbits: The paranasal sinuses and mastoid air cells are clear. Globes and orbits are within normal limits. IMPRESSION: 1. Remote lacunar infarcts involving the pons. 2. No acute intracranial abnormality explain the patient's symptoms. Ischemia in the region of the previous infarcts could cause contralateral face and lower extremity symptoms. Electronically Signed   By: San Morelle M.D.   On: 08/31/2017 11:13   US Carotid Bilateral (at [facility] Only)  Result Date: 08/31/2017 CLINICAL DATA:  57 year old female with a history of TIA. Cardiovascular risk factors include hypertension, stroke/TIA, tobacco use EXAM: BILATERAL CAROTID DUPLEX ULTRASOUND TECHNIQUE: Pearline Cables scale imaging, color Doppler and duplex ultrasound were performed of bilateral carotid and vertebral arteries in the neck. COMPARISON:  None. FINDINGS: Criteria: Quantification of carotid stenosis is based on velocity parameters that correlate the residual internal carotid diameter with NASCET-based stenosis levels, using the diameter of the distal internal carotid lumen as the denominator for stenosis measurement. The following velocity measurements were obtained: RIGHT ICA:  Systolic 99 cm/sec,  Diastolic 37 cm/sec CCA:  94 cm/sec SYSTOLIC ICA/CCA RATIO:  1.1 ECA:  64 cm/sec LEFT ICA:  Systolic 88 cm/sec, Diastolic 33 cm/sec CCA:  98 cm/sec SYSTOLIC ICA/CCA RATIO:  0.9 ECA:  53 cm/sec Right Brachial SBP: Not acquired Left Brachial SBP: Not acquired RIGHT CAROTID ARTERY: No significant calcifications of the right common carotid artery. Intermediate waveform maintained. Heterogeneous and partially calcified plaque at the right carotid bifurcation. No significant lumen shadowing. Low resistance waveform of the right ICA. No significant tortuosity. RIGHT VERTEBRAL ARTERY: Antegrade flow with low resistance waveform. LEFT CAROTID ARTERY: No significant calcifications of the left common carotid artery. Intermediate waveform maintained. Heterogeneous and partially calcified plaque at the left carotid bifurcation without significant lumen shadowing. Low resistance waveform of the left ICA. No significant tortuosity. LEFT VERTEBRAL ARTERY:  Antegrade flow with low resistance waveform. Additional: Incidental left thyroid nodule measures 2.2 cm IMPRESSION: IMPRESSION Color duplex indicates minimal  heterogeneous and calcified plaque, with no hemodynamically significant stenosis by duplex criteria in the extracranial cerebrovascular circulation. Incidental left thyroid nodule. Referral for dedicated thyroid ultrasound recommended. Signed, Dulcy Fanny. Dellia Nims, RPVI [facility] Electronically Signed   By: Corrie Mckusick D.O.   On: 08/31/2017 12:22     Management plans discussed with the patient, family and they are in agreement.  CODE STATUS:     Code Status Orders  (From admission, onward)        Start     Ordered   08/31/17 0025  Full code  Continuous     08/31/17 0024    Code Status History    This patient has a current code status but no historical code status.      TOTAL TIME TAKING CARE OF THIS PATIENT: 38 minutes.     Latavia Goga M.D on 08/31/2017 at 2:43 PM  Between 7am to 6pm - Pager - [phone]  After 6pm go to [email or web address] - Technical brewer [facility]  Office  [phone]  CC: Primary care physician; Leone Haven, MD   Note: This dictation was prepared with Dragon dictation along with smaller phrase technology. Any transcriptional errors that result from this process are unintentional.

## 2017-09-01 LAB — HIV ANTIBODY (ROUTINE TESTING W REFLEX): HIV SCREEN 4TH GENERATION: NONREACTIVE

## 2017-09-05 ENCOUNTER — Ambulatory Visit: Payer: Self-pay

## 2017-09-05 NOTE — Telephone Encounter (Signed)
FYI

## 2017-09-05 NOTE — Telephone Encounter (Signed)
fyi

## 2017-09-05 NOTE — Telephone Encounter (Signed)
Pt. Called to report feeling sluggish and not having any appetite since she was discharged from the hospital on 6/20.  Stated she was discharged on a Nicotine patch, but did not like the way it made her feel.  Reported she "felt sluggish, and just felt terrible."  Stopped the Nicotine patch yesterday.  Denies smoking; stated she just quit.  Also c/o constipation.  Reported she did not have a BM from Wed., 6/19 to Sun., 6/23.  Reported she took a store brand laxative on Sunday AM, and had good results on Sunday.  Also had a small stool on Monday.  C/o nausea; some abdominal bloating, and "an uncomfortable feeling in her abdomen." Denied vomiting. Reported she is passing gas.  Reported she tries to eat, but "nothing tastes right."  Also, pt. seemed confused about her ASA dose.  Reported she is taking an 81 mg tablet, and a 325 mg tablet of ASA everyday.  Reported that she understood she was to take both of these when discharged.  Advised pt. the discharge note only indicated to take ASA 325 mg. daily.  Verb. Understanding.  Has hosp. F/u appt. On Fri., 7/5.  Advised pt. To maintain hydration, mobility, and continue to try to maintain regular bowel elimination. Dietary suggestions given for maintaining good bowel pattern.  Advised to eat small frequent meals, and to be sure to eat good sources of protein.  Will make Dr. Caryl Bis aware of sx's.  Advised to call if symptoms worsen, prior to f/u appt. 7/5.  Verb. Understanding and agrees with plan.             Reason for Disposition . Treating constipation with Over-The-Counter (OTC) medicines, questions about  Answer Assessment - Initial Assessment Questions 1. STOOL PATTERN OR FREQUENCY: "How often do you pass bowel movements (BMs)?"  (Normal range: tid to q 3 days)  "When was the last BM passed?"       At least once/ day 2. STRAINING: "Do you have to strain to have a BM?"      Some straining  3. RECTAL PAIN: "Does your rectum hurt when the stool comes  out?" If so, ask: "Do you have hemorrhoids? How bad is the pain?"  (Scale 1-10; or mild, moderate, severe)     Denied rectal pain 4. STOOL COMPOSITION: "Are the stools hard?"      Regular stool, then became loose  5. BLOOD ON STOOLS: "Has there been any blood on the toilet tissue or on the surface of the BM?" If so, ask: "When was the last time?"      Denied blood or dark stools 6. CHRONIC CONSTIPATION: "Is this a new problem for you?"  If no, ask: How long have you had this problem?" (days, weeks, months)      About 6 days.   7. CHANGES IN DIET: "Have there been any recent changes in your diet?"      No  8. MEDICATIONS: "Have you been taking any new medications?"     Cholesterol medication and additional ASA 325 mg.   9. LAXATIVES: "Have you been using any laxatives or enemas?"  If yes, ask "What, how often, and when was the last time?"     Took a store brand laxative on Sunday, had good results on Sun., and small amt. On Monday.  10. CAUSE: "What do you think is causing the constipation?"        Was hospitalized from Wednesday to Thursday for TIA; was on Cardiac diet  in the hospital.  11. OTHER SYMPTOMS: "Do you have any other symptoms?" (e.g., abdominal pain, fever, vomiting)       Loss of appetite, uncomfortable feeling in stomach, abd. bloating, nausea 12. PREGNANCY: "Is there any chance you are pregnant?" "When was your last menstrual period?"       N/a ; has gone through menopause  Protocols used: CONSTIPATION-A-AH

## 2017-09-06 DIAGNOSIS — G459 Transient cerebral ischemic attack, unspecified: Secondary | ICD-10-CM | POA: Diagnosis not present

## 2017-09-06 NOTE — Telephone Encounter (Signed)
Patient notified

## 2017-09-06 NOTE — Telephone Encounter (Signed)
Noted. If constipation symptoms worsen or she develops abdominal pain, vomiting, or the symptoms she had with the nicotine patch persist she should be evaluated.

## 2017-09-06 NOTE — Telephone Encounter (Signed)
She could try adding miralax over the counter for her constipation. Her appetite may be decreased from the constipation. We can discuss further at her visit.

## 2017-09-06 NOTE — Telephone Encounter (Signed)
Patient notified. Patient states she has had no appetite since she left the hospital. She is still having constipation.

## 2017-09-12 DIAGNOSIS — G459 Transient cerebral ischemic attack, unspecified: Secondary | ICD-10-CM | POA: Insufficient documentation

## 2017-09-13 ENCOUNTER — Ambulatory Visit: Payer: 59 | Admitting: Internal Medicine

## 2017-09-15 ENCOUNTER — Encounter: Payer: Self-pay | Admitting: Family Medicine

## 2017-09-15 ENCOUNTER — Encounter: Payer: Self-pay | Admitting: Physician Assistant

## 2017-09-15 ENCOUNTER — Ambulatory Visit (INDEPENDENT_AMBULATORY_CARE_PROVIDER_SITE_OTHER): Payer: 59 | Admitting: Family Medicine

## 2017-09-15 VITALS — BP 114/68 | HR 68 | Temp 98.7°F | Resp 15 | Ht 65.0 in | Wt 143.8 lb

## 2017-09-15 DIAGNOSIS — G459 Transient cerebral ischemic attack, unspecified: Secondary | ICD-10-CM | POA: Diagnosis not present

## 2017-09-15 DIAGNOSIS — E7800 Pure hypercholesterolemia, unspecified: Secondary | ICD-10-CM | POA: Diagnosis not present

## 2017-09-15 DIAGNOSIS — Q2112 Patent foramen ovale: Secondary | ICD-10-CM | POA: Insufficient documentation

## 2017-09-15 DIAGNOSIS — Q211 Atrial septal defect: Secondary | ICD-10-CM

## 2017-09-15 DIAGNOSIS — Z720 Tobacco use: Secondary | ICD-10-CM | POA: Diagnosis not present

## 2017-09-15 DIAGNOSIS — E785 Hyperlipidemia, unspecified: Secondary | ICD-10-CM | POA: Diagnosis not present

## 2017-09-15 NOTE — Assessment & Plan Note (Signed)
She will restart Lipitor.  We will recheck labs in 4 weeks.

## 2017-09-15 NOTE — Assessment & Plan Note (Signed)
She will keep her appointment with cardiology next week.

## 2017-09-15 NOTE — Assessment & Plan Note (Signed)
Congratulated on smoking cessation.  

## 2017-09-15 NOTE — Patient Instructions (Signed)
Nice to see you. Please keep your appointment with cardiology. Please restart the Lipitor. Please continue the Plavix. If you develop any recurrent symptoms of numbness, weakness, or you develop vision changes, facial droop, or slurred speech please be evaluated immediately.  Please do not drive yourself to the emergency room.

## 2017-09-15 NOTE — Progress Notes (Signed)
Tommi Rumps, MD Phone: [phone]  Alyssa Matthews is a 57 y.o. female who presents today for f/u.  CC: TIA, HLD, tobacco abuse  Patient seen today for hospital follow-up.  She developed sudden onset left face and leg numbness and some weakness while at work.  She went to urgent care who sent her to the emergency department.  She was admitted.  CT of head was without any acute findings.  MRI showed remote lacunar infarcts but no acute findings.  Carotid Dopplers with no hemodynamically significant stenosis.  Echo with bubble study done revealing a small PFO.  She sees cardiology next week.  Inpatient neurology recommended conservative management of PFO.  She is discharged on aspirin though her outpatient neurologist changed her to Plavix and advised discontinuing the aspirin.  She has quit smoking.  She was on a nicotine patch though states that made her feel like a zombie.  That symptom resolved after discontinuing the nicotine patch.  She stopped the Lipitor on her own until she spoke with me.  She had no side effects with this.  Social History   Tobacco Use  Smoking Status Current Every Day Smoker  . Packs/day: 0.75  . Years: 30.00  . Pack years: 22.50  . Types: Cigarettes  Smokeless Tobacco Never Used     ROS see history of present illness  Objective  Physical Exam Vitals:   09/15/17 1344  BP: 114/68  Pulse: 68  Resp: 15  Temp: 98.7 F (37.1 C)  SpO2: 98%    BP Readings from Last 3 Encounters:  09/15/17 114/68  08/31/17 108/71  08/30/17 122/72   Wt Readings from Last 3 Encounters:  09/15/17 143 lb 12.8 oz (65.2 kg)  08/31/17 143 lb 8 oz (65.1 kg)  08/30/17 145 lb (65.8 kg)    Physical Exam  Constitutional: No distress.  HENT:  Mouth/Throat: Oropharynx is clear and moist.  Eyes: Pupils are equal, round, and reactive to light. Conjunctivae are normal.  Cardiovascular: Normal rate, regular rhythm and normal heart sounds.  Pulmonary/Chest: Effort normal  and breath sounds normal.  Musculoskeletal: She exhibits no edema.  Neurological: She is alert.  CN 2-12 intact, 5/5 strength in bilateral biceps, triceps, grip, quads, hamstrings, plantar and dorsiflexion, sensation to light touch intact in bilateral UE and LE, normal gait  Skin: Skin is warm and dry. She is not diaphoretic.     Assessment/Plan: Please see individual problem list.  TIA (transient ischemic attack) Treated for TIA in the hospital.  Found to have remote lacunar infarct so no acute changes on MRI.  Started on Plavix by outpatient neurology and Lipitor by inpatient physician.  I have encouraged her to restart the Lipitor.  We will plan on checking LDL and liver function test in 4 weeks.  She will continue on Plavix.  If she develops recurrent symptoms I advised that she be evaluated in the emergency department.  Elevated LDL cholesterol level She will restart Lipitor.  We will recheck labs in 4 weeks.  Tobacco abuse Congratulated on smoking cessation.  PFO (patent foramen ovale) She will keep her appointment with cardiology next week.   Orders Placed This Encounter  Procedures  . LDL cholesterol, direct    Standing Status:   Future    Standing Expiration Date:   09/16/2018  . Comp Met (CMET)    Standing Status:   Future    Standing Expiration Date:   09/16/2018    No orders of the defined types were placed  in this encounter.    Tommi Rumps, MD [place]

## 2017-09-15 NOTE — Progress Notes (Signed)
Cardiology Office Note Date:  09/19/2017  Patient ID:  Alyssa Matthews, Alyssa Matthews [birth year], MRN [id number] PCP:  Leone Haven, MD  Cardiologist:  Dr. Rockey Situ, MD    Chief Complaint: Hospital follow up  History of Present Illness: ZOIEE Matthews is a 57 y.o. female with history of recent TIA in 08/2017 with possible patient reported TIA/CVA ~ 4 years prior with recent MRI brain showing prior lacunar infarcts, HTN, HLD, hyperthyroidism s/p RAI, ongoing tobacco abuse with prior cessation attempts being unsuccessful who presents for hospital follow up after recent admission to [facility] in 08/2017 for TIA and was noted to have a small PFO on echo with bubble study.   Patient was initially evaluated by Dr. Rockey Situ in 12/2016 for exertional SOB and chest pain. She was concerned about her symptoms given her family history with her mother having PAD s/p stenting and lower extremity arterial bypass (smoker), father with cancer s/p XRT/chemo (smoker), aunt with cancer s/p chemo (smoker). Calcium score was ordered, though never completed. Patient was admitted to the hospital on 08/30/2017 with left facial numbness and left lower extremity numbness that started suddenly ~ 2.5 hours prior to her presentation. No administration of tPA given resolution of symptoms. CT head was not acute. MRI brain showed remote lacunar infarcts involving the pons. Carotid ultrasound was negative for hemodynamically significant stenoses. LDL 108 (not on statin at admission), A1c 5.5. Echo with bubble study showed an EF of 60-65%, no RWMA, Gr1DD, mild MR, RVSF normal, patent foramen ovale was noted with a positive saline contrast study, PASP normal. She was seen by neurology and placed on ASA 325 mg daily. Inpatient neurology recommended conservative management of her PFO at that time given the remainder of her vascular risk factors were not adequately controlled at that time. She was seen by outpatient neurology on 09/06/2017 with  recommendation to change ASA to Plavix.   She comes in doing well today.  She does not have any residual deficits from her TIA.  She has self discontinued atorvastatin secondary to nausea and lower extremity myalgias.  Symptoms have improved with holding of atorvastatin.  She is tolerating Plavix without issues.  No falls.  No BRBPR or melena.  No hematemesis or hemoptysis.  No hematuria.  No dizziness, presyncope, or syncope.  No chest pain or shortness of breath.  The patient's clinical case was discussed/reviewed by our structural heart team in [place] given her history of lacunar infarct and noted PFO on echocardiogram as above.  Structural heart team agrees with inpatient neurology that the PFO was small as noted on the bubble study and the patient has multiple uncontrolled risk factors for stroke.  Continued conservative management was recommended by both neurology and the structural heart team.  Since her discharge, the patient has not smoked any cigarettes since 6/21 and has not had any alcohol since prior to her hospital admission.  She is not checking her blood pressure at home.   Past Medical History:  Diagnosis Date  . History of blood transfusion   . Hypercholesteremia   . Hypertension   . Hyperthyroidism    a. s/p RAI  . Lacunar infarction ([place])    a. MRI brain 6/19: remote lacunar infarcts involving the pons  . PFO (patent foramen ovale)    a. TTE 6/19: EF 60-65%, no RWMA, Gr1DD, mild MR, RVSF normal, patent foramen ovale was noted with a positive saline contrast study, PASP normal  . TIA (transient ischemic attack) 08/2017  .  Wears dentures    partial upper    Past Surgical History:  Procedure Laterality Date  . BREAST CYST ASPIRATION  2015  . COLONOSCOPY WITH PROPOFOL N/A 03/30/2015   Procedure: COLONOSCOPY WITH PROPOFOL;  Surgeon: Lucilla Lame, MD;  Location: [place];  Service: Endoscopy;  Laterality: N/A;  . DILATION AND CURETTAGE OF UTERUS    . POLYPECTOMY   03/30/2015   Procedure: POLYPECTOMY;  Surgeon: Lucilla Lame, MD;  Location: [place];  Service: Endoscopy;;    Current Meds  Medication Sig  . amLODipine (NORVASC) 10 MG tablet TAKE 1 TABLET (10 MG TOTAL) BY MOUTH DAILY.  Marland Kitchen clopidogrel (PLAVIX) 75 MG tablet Take 75 mg by mouth daily.    Allergies:   Patient has no known allergies.   Social History:  The patient  reports that she has been smoking cigarettes.  She has a 22.50 pack-year smoking history. She has never used smokeless tobacco. She reports that she drinks about 0.6 oz of alcohol per week. She reports that she does not use drugs.   Family History:  The patient's family history includes Cancer in her father; Drug abuse in her paternal grandmother; Hypertension in her brother and mother; Kidney disease in her brother.  ROS:   Review of Systems  Constitutional: Negative for chills, diaphoresis, fever, malaise/fatigue and weight loss.  HENT: Negative for congestion.   Eyes: Negative for discharge and redness.  Respiratory: Negative for cough, hemoptysis, sputum production, shortness of breath and wheezing.   Cardiovascular: Negative for chest pain, palpitations, orthopnea, claudication, leg swelling and PND.  Gastrointestinal: Negative for abdominal pain, blood in stool, heartburn, melena, nausea and vomiting.  Genitourinary: Negative for hematuria.  Musculoskeletal: Negative for falls and myalgias.  Skin: Negative for rash.  Neurological: Negative for dizziness, tingling, tremors, sensory change, speech change, focal weakness, loss of consciousness and weakness.  Endo/Heme/Allergies: Does not bruise/bleed easily.  Psychiatric/Behavioral: Negative for substance abuse. The patient is not nervous/anxious.   All other systems reviewed and are negative.    PHYSICAL EXAM:  VS:  BP 120/76 (BP Location: Left Arm, Patient Position: Sitting, Cuff Size: Normal)   Pulse 61   Ht 5\' 5"  (1.651 m)   Wt 143 lb 8 oz (65.1 kg)   BMI  23.88 kg/m  BMI: Body mass index is 23.88 kg/m.  Physical Exam  Constitutional: She is oriented to person, place, and time. She appears well-developed and well-nourished.  HENT:  Head: Normocephalic and atraumatic.  Eyes: Right eye exhibits no discharge. Left eye exhibits no discharge.  Neck: Normal range of motion. No JVD present.  Cardiovascular: Normal rate, regular rhythm, S1 normal and S2 normal. Exam reveals no distant heart sounds, no friction rub, no midsystolic click and no opening snap.  Murmur heard. High-pitched blowing holosystolic murmur is present with a grade of 1/6 at the apex. Pulses:      Posterior tibial pulses are 2+ on the right side, and 2+ on the left side.  Pulmonary/Chest: Effort normal and breath sounds normal. No respiratory distress. She has no decreased breath sounds. She has no wheezes. She has no rales. She exhibits no tenderness.  Abdominal: Soft. She exhibits no distension. There is no tenderness.  Musculoskeletal: She exhibits no edema.  Neurological: She is alert and oriented to person, place, and time.  Skin: Skin is warm and dry. No cyanosis. Nails show no clubbing.  Psychiatric: She has a normal mood and affect. Her speech is normal and behavior is normal. Judgment  and thought content normal.     EKG:  Was ordered and interpreted by me today. Shows NSR, 61 bpm, nonspecific ST-T changes (unchanged from prior)  Recent Labs: 08/21/2017: TSH 0.40 08/30/2017: ALT 12; BUN 13; Potassium 3.3; Sodium 137 08/31/2017: Creatinine, Ser 0.49; Hemoglobin 12.6; Platelets 258  08/31/2017: Cholesterol 190; HDL 56; LDL Cholesterol 108; Total CHOL/HDL Ratio 3.4; Triglycerides 129; VLDL 26   Estimated Creatinine Clearance: 72.3 mL/min (by C-G formula based on SCr of 0.49 mg/dL).   Wt Readings from Last 3 Encounters:  09/19/17 143 lb 8 oz (65.1 kg)  09/15/17 143 lb 12.8 oz (65.2 kg)  08/31/17 143 lb 8 oz (65.1 kg)     Other studies reviewed: Additional  studies/records reviewed today include: summarized above  ASSESSMENT AND PLAN:  1. PFO: Case was reviewed by our structural heart team with recommendation for conservative management at this time in light of the patient's uncontrolled risk factors.  No plans for surgical intervention at this time.  Continue to monitor.  2. Lacunar infarct/TIA: Followed by neurology.  Agree with Plavix 75 mg daily.  Aggressive risk factor modification and secondary prevention with a goal LDL of less than 70.  Patient has quit tobacco and alcohol abuse.  Kudos.  No residual deficits.  3. Mitral regurgitation: Asymptomatic.  Can monitor with periodic echocardiogram.  4. Essential hypertension: Blood pressure well controlled today.  Continue amlodipine 10 mg daily.  5. Hyperlipidemia: LDL during admission noted to be 108 (not on a statin).  Patient was discharged on atorvastatin 20 mg daily though she has self discontinued this secondary to nausea and myalgias.  Trial of Crestor 5 mg daily.  Recheck fasting lipid panel and liver function in approximately 8 weeks.  6. Tobacco/alcohol abuse: Patient has quit both.  Kudos.  Disposition: F/u with Dr. Rockey Situ in 3 months.  Current medicines are reviewed at length with the patient today.  The patient did not have any concerns regarding medicines.  Signed, Christell Faith, PA-C 09/19/2017 2:48 PM     [facility] [address] [place], [place] [address] [phone]

## 2017-09-15 NOTE — Assessment & Plan Note (Signed)
Treated for TIA in the hospital.  Found to have remote lacunar infarct so no acute changes on MRI.  Started on Plavix by outpatient neurology and Lipitor by inpatient physician.  I have encouraged her to restart the Lipitor.  We will plan on checking LDL and liver function test in 4 weeks.  She will continue on Plavix.  If she develops recurrent symptoms I advised that she be evaluated in the emergency department.

## 2017-09-19 ENCOUNTER — Ambulatory Visit: Payer: Self-pay | Admitting: Family Medicine

## 2017-09-19 ENCOUNTER — Ambulatory Visit (INDEPENDENT_AMBULATORY_CARE_PROVIDER_SITE_OTHER): Payer: 59 | Admitting: Physician Assistant

## 2017-09-19 ENCOUNTER — Encounter: Payer: Self-pay | Admitting: Physician Assistant

## 2017-09-19 VITALS — BP 120/76 | HR 61 | Ht 65.0 in | Wt 143.5 lb

## 2017-09-19 DIAGNOSIS — G459 Transient cerebral ischemic attack, unspecified: Secondary | ICD-10-CM

## 2017-09-19 DIAGNOSIS — Z720 Tobacco use: Secondary | ICD-10-CM

## 2017-09-19 DIAGNOSIS — Z79899 Other long term (current) drug therapy: Secondary | ICD-10-CM | POA: Diagnosis not present

## 2017-09-19 DIAGNOSIS — I340 Nonrheumatic mitral (valve) insufficiency: Secondary | ICD-10-CM

## 2017-09-19 DIAGNOSIS — Q211 Atrial septal defect: Secondary | ICD-10-CM | POA: Diagnosis not present

## 2017-09-19 DIAGNOSIS — I6381 Other cerebral infarction due to occlusion or stenosis of small artery: Secondary | ICD-10-CM | POA: Diagnosis not present

## 2017-09-19 DIAGNOSIS — Q2112 Patent foramen ovale: Secondary | ICD-10-CM

## 2017-09-19 DIAGNOSIS — I10 Essential (primary) hypertension: Secondary | ICD-10-CM | POA: Diagnosis not present

## 2017-09-19 DIAGNOSIS — E7800 Pure hypercholesterolemia, unspecified: Secondary | ICD-10-CM

## 2017-09-19 MED ORDER — ROSUVASTATIN CALCIUM 5 MG PO TABS: 5.0000 mg | ORAL_TABLET | Freq: Every day | ORAL | 3 refills | Status: DC

## 2017-09-19 NOTE — Telephone Encounter (Signed)
patient notified and will discontinue Lipitor to see of symptoms improve.

## 2017-09-19 NOTE — Telephone Encounter (Signed)
Patient saw Dr. Caryl Bis on 09-15-17 in his note he wanted her to start back on lipitor and the patient did but Dr. Caryl Bis does not prescribe this medication. Please advise.

## 2017-09-19 NOTE — Telephone Encounter (Signed)
Pt wanting to stop the Lipitor. She is having the following sx: headache, fatigue, constipation, intermittent nausea, no appetite, dark yellow urine, leg cramping. Pt states that she had just restarted the Lipitor last Friday and sx started yesterday. Pt stated she believes it is from the Lipitor. Asked about the dark urine and pt stated that she is only drinking 3 bottles of water a day. Asked pt to increase his water intake. Pt stated that she is not going to take the Lipitor and is asking if something else could be prescribed.   Reason for Disposition . Caller has NON-URGENT medication question about med that PCP prescribed and triager unable to answer question  Answer Assessment - Initial Assessment Questions 1. SYMPTOMS: "Do you have any symptoms?"     Headache, fatigue, constipation, nausea, no appetite, Urine dark yellow, leg cramping 2. SEVERITY: If symptoms are present, ask "Are they mild, moderate or severe?"     moderate  Protocols used: MEDICATION QUESTION CALL-A-AH

## 2017-09-19 NOTE — Patient Instructions (Signed)
Medication Instructions:  Your physician has recommended you make the following change in your medication:  1- START Crestor 5 mg by mouth once a day.   Labwork: Your physician recommends that you return for lab work in: [place] (LIPID, LIVER). - Please go to the [facility]. You will check in at the front desk to the right as you walk into the atrium. Valet Parking is offered if needed. - ON OR AROUND September 8TH.    Testing/Procedures: none  Follow-Up: Your physician recommends that you schedule a follow-up appointment in: [place].   If you need a refill on your cardiac medications before your next appointment, please call your pharmacy.

## 2017-09-19 NOTE — Telephone Encounter (Signed)
Please advise 

## 2017-09-19 NOTE — Telephone Encounter (Addendum)
Those would seem to be odd side effects from the Lipitor though she could see if they improve coming off of it.  If they do not she needs to be evaluated.  We could try switching her to Crestor if she would be willing though I would want to see if her symptoms improve off of the Lipitor.  If her symptoms worsen she needs to be evaluated.  Thanks.

## 2017-09-25 ENCOUNTER — Telehealth: Payer: Self-pay | Admitting: Family Medicine

## 2017-09-25 DIAGNOSIS — Z0279 Encounter for issue of other medical certificate: Secondary | ICD-10-CM

## 2017-09-25 NOTE — Telephone Encounter (Signed)
Pt dropped off FMLA paperwork to be filled out. Placed in Dr. Ellen Henri color folder upfront. Please advise pt when complete

## 2017-09-25 NOTE — Telephone Encounter (Signed)
Placed in red folder  

## 2017-09-30 NOTE — Telephone Encounter (Signed)
Please contact the patient to determine what this FLMA is for.

## 2017-10-02 NOTE — Telephone Encounter (Signed)
Patient states it is for when she had the mini stroke and she was out of work. She states it is to protect her job in case anything else happens regarding the mini stroke and she has to miss more work. She was hospitalized for a day and it will need to have the date of when she was at the hospital for this.

## 2017-10-05 NOTE — Telephone Encounter (Signed)
faxed

## 2017-10-05 NOTE — Telephone Encounter (Signed)
Completed.  Please fill in our information and then the patient's name and claim number at the top of each page and then it can be submitted.

## 2017-10-10 ENCOUNTER — Other Ambulatory Visit: Payer: Self-pay | Admitting: Family Medicine

## 2017-10-16 ENCOUNTER — Other Ambulatory Visit (INDEPENDENT_AMBULATORY_CARE_PROVIDER_SITE_OTHER): Payer: 59

## 2017-10-16 ENCOUNTER — Telehealth: Payer: Self-pay | Admitting: Family Medicine

## 2017-10-16 DIAGNOSIS — E785 Hyperlipidemia, unspecified: Secondary | ICD-10-CM | POA: Diagnosis not present

## 2017-10-16 LAB — COMPREHENSIVE METABOLIC PANEL
ALT: 17 U/L (ref 0–35)
AST: 15 U/L (ref 0–37)
Albumin: 4.1 g/dL (ref 3.5–5.2)
Alkaline Phosphatase: 99 U/L (ref 39–117)
BUN: 16 mg/dL (ref 6–23)
CO2: 27 meq/L (ref 19–32)
Calcium: 9.4 mg/dL (ref 8.4–10.5)
Chloride: 108 mEq/L (ref 96–112)
Creatinine, Ser: 0.58 mg/dL (ref 0.40–1.20)
GFR: 138.79 mL/min (ref >60.00)
GLUCOSE: 108 mg/dL — AB (ref 70–99)
POTASSIUM: 3.6 meq/L (ref 3.5–5.1)
Sodium: 142 mEq/L (ref 135–145)
Total Bilirubin: 0.5 mg/dL (ref 0.2–1.2)
Total Protein: 6.9 g/dL (ref 6.0–8.3)

## 2017-10-16 LAB — LDL CHOLESTEROL, DIRECT: LDL DIRECT: 67 mg/dL

## 2017-10-16 NOTE — Telephone Encounter (Signed)
faxed

## 2017-10-16 NOTE — Telephone Encounter (Signed)
Please advise 

## 2017-10-16 NOTE — Telephone Encounter (Signed)
Completed.

## 2017-10-16 NOTE — Telephone Encounter (Signed)
PT is requesting to have her FMLA paper work re-faxed with the following dates; June 19-23, 2019.

## 2017-11-27 ENCOUNTER — Ambulatory Visit (INDEPENDENT_AMBULATORY_CARE_PROVIDER_SITE_OTHER): Payer: 59 | Admitting: Family Medicine

## 2017-11-27 ENCOUNTER — Encounter: Payer: Self-pay | Admitting: Family Medicine

## 2017-11-27 VITALS — BP 110/66 | HR 66 | Temp 98.5°F | Ht 65.0 in | Wt 151.8 lb

## 2017-11-27 DIAGNOSIS — E559 Vitamin D deficiency, unspecified: Secondary | ICD-10-CM

## 2017-11-27 DIAGNOSIS — R200 Anesthesia of skin: Secondary | ICD-10-CM

## 2017-11-27 DIAGNOSIS — E785 Hyperlipidemia, unspecified: Secondary | ICD-10-CM | POA: Diagnosis not present

## 2017-11-27 DIAGNOSIS — G459 Transient cerebral ischemic attack, unspecified: Secondary | ICD-10-CM

## 2017-11-27 LAB — CBC
HEMATOCRIT: 36.4 % (ref 36.0–46.0)
Hemoglobin: 12.4 g/dL (ref 12.0–15.0)
MCHC: 34.1 g/dL (ref 30.0–36.0)
MCV: 87 fl (ref 78.0–100.0)
PLATELETS: 302 10*3/uL (ref 150.0–400.0)
RBC: 4.19 Mil/uL (ref 3.87–5.11)
RDW: 13.8 % (ref 11.5–15.5)
WBC: 4.5 10*3/uL (ref 4.0–10.5)

## 2017-11-27 LAB — COMPREHENSIVE METABOLIC PANEL
ALBUMIN: 3.7 g/dL (ref 3.5–5.2)
ALT: 15 U/L (ref 0–35)
AST: 16 U/L (ref 0–37)
Alkaline Phosphatase: 84 U/L (ref 39–117)
BUN: 11 mg/dL (ref 6–23)
CALCIUM: 9.4 mg/dL (ref 8.4–10.5)
CHLORIDE: 106 meq/L (ref 96–112)
CO2: 31 mEq/L (ref 19–32)
Creatinine, Ser: 0.59 mg/dL (ref 0.40–1.20)
GFR: 136.02 mL/min (ref >60.00)
Glucose, Bld: 91 mg/dL (ref 70–99)
POTASSIUM: 4.1 meq/L (ref 3.5–5.1)
Sodium: 141 mEq/L (ref 135–145)
Total Bilirubin: 0.4 mg/dL (ref 0.2–1.2)
Total Protein: 6.6 g/dL (ref 6.0–8.3)

## 2017-11-27 LAB — VITAMIN D 25 HYDROXY (VIT D DEFICIENCY, FRACTURES): VITD: 14.9 ng/mL — AB (ref 30.00–100.00)

## 2017-11-27 LAB — HEMOGLOBIN A1C: HEMOGLOBIN A1C: 5.9 % (ref 4.6–6.5)

## 2017-11-27 LAB — SEDIMENTATION RATE: SED RATE: 20 mm/h (ref 0–30)

## 2017-11-27 LAB — B12 AND FOLATE PANEL
Folate: 7.1 ng/mL (ref >5.9)
VITAMIN B 12: 240 pg/mL (ref 211–911)

## 2017-11-27 MED ORDER — CLOPIDOGREL BISULFATE 75 MG PO TABS: 75.0000 mg | ORAL_TABLET | Freq: Every day | ORAL | 1 refills | Status: DC

## 2017-11-27 MED ORDER — ROSUVASTATIN CALCIUM 5 MG PO TABS: 5.0000 mg | ORAL_TABLET | Freq: Every day | ORAL | 1 refills | Status: DC

## 2017-11-27 NOTE — Progress Notes (Signed)
Subjective:    Patient ID: Alyssa Matthews, female    DOB: [birth year], 57 y.o.   MRN: [id number]  HPI   Patient presents to clinic complaining of bilateral fingertip numbness in all fingers.  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  Patient states she had TIA in June 2019.  She has upcoming neurology appointment for further evaluation of this.  Patient quit smoking in June 2019, previously had been three-quarter pack per day smoker.  Patient denies any history of diabetes.  Patient cannot say for sure if the tingling/numbness sensation in fingertips is worse in cold weather.  Patient Active Problem List   Diagnosis Date Noted  . PFO (patent foramen ovale) 09/15/2017  . TIA (transient ischemic attack) 08/30/2017  . Chronic left shoulder pain 05/17/2017  . Sebaceous cyst of labia 12/05/2016  . Vulvar lesion 10/31/2016  . Rib contusion, left, initial encounter 06/28/2016  . Subclinical hyperthyroidism 06/28/2016  . Dyspnea on exertion 04/18/2016  . Elevated LDL cholesterol level 06/29/2015  . Bilateral numbness of feet 06/29/2015  . Tobacco abuse 05/18/2015  . Prediabetes 05/18/2015  . Lipoma 04/16/2015  . Benign neoplasm of sigmoid colon   . Shoulder pain, bilateral 01/15/2015  . Essential hypertension 01/01/2015   Social History   Tobacco Use  . Smoking status: Current Every Day Smoker    Packs/day: 0.75    Years: 30.00    Pack years: 22.50    Types: Cigarettes  . Smokeless tobacco: Never Used  Substance Use Topics  . Alcohol use: Yes    Alcohol/week: 1.0 standard drinks    Types: 1 Cans of beer per week    Comment: occ.    Review of Systems  Constitutional: Negative for chills, fatigue and fever.  HENT: Negative for congestion, ear pain, sinus pain and sore throat.   Eyes: Negative.   Respiratory: Negative for cough, shortness of breath and wheezing.   Cardiovascular: Negative for chest  pain, palpitations and leg swelling.  Gastrointestinal: Negative for abdominal pain, diarrhea, nausea and vomiting.  Genitourinary: Negative for dysuria, frequency and urgency.  Musculoskeletal: Negative for arthralgias and myalgias.  Skin: Negative for color change, pallor and rash.  Neurological: Negative for syncope, light-headedness and headaches. +tingling/numbness fingertips of all hands.  Psychiatric/Behavioral: The patient is not nervous/anxious.       Objective:   Physical Exam  Constitutional: She is oriented to person, place, and time. She appears well-developed and well-nourished. No distress.  HENT:  Head: Normocephalic and atraumatic.  Eyes: Pupils are equal, round, and reactive to light. EOM are normal. No scleral icterus.  Neck: Normal range of motion. Neck supple. No tracheal deviation present.  Cardiovascular: Normal rate and regular rhythm.  No murmur heard. Neurological: She is alert and oriented to person, place, and time.  Grips equal and strong.  Range of motion of fingers wrist elbows shoulders normal.  Strength in upper extremities normal.  Patient is able to feel me touching each fingertip with my fingers.  Patient is also able to feel pinprick sensation on all fingertips.  Skin on fingertips does not appear discolored or pale in any way.  Skin: Skin is warm and dry. Capillary refill takes less than 2 seconds. No pallor.  Psychiatric: She has a normal mood and affect. Her behavior is normal. Thought content normal.  Nursing note and vitals reviewed.     Vitals:   11/27/17 7628  BP: 110/66  Pulse: 66  Temp: 98.5 F (36.9 C)  SpO2: 99%   Assessment & Plan:   Numbness in fingertips - unclear cause for this.  We will get blood work to look at electrolytes, folic acid, vitamin D Y04, liver kidney, ANA, sed rate and A1c.  Differential diagnosis consideration could be raynauds phenomena, nerve damage, residual symptoms status post TIA June 2019, peripheral  vascular disease.  If lab work is unremarkable we can consider getting EMG studies of bilateral upper extremities to evaluate nerve functions.  Patient will keep regularly scheduled appointment as planned in October 2019.  Advised to return to clinic sooner if this issues persist or worsen.

## 2017-11-28 MED ORDER — VITAMIN D (ERGOCALCIFEROL) 1.25 MG (50000 UNIT) PO CAPS: 50000.0000 [IU] | ORAL_CAPSULE | ORAL | 0 refills | Status: DC

## 2017-11-28 NOTE — Addendum Note (Signed)
Addended by: Philis Nettle on: 11/28/2017 12:04 PM   Modules accepted: Orders

## 2017-11-29 LAB — ANA: ANA: NEGATIVE

## 2017-12-04 ENCOUNTER — Ambulatory Visit: Payer: 59 | Admitting: Family Medicine

## 2017-12-14 ENCOUNTER — Ambulatory Visit: Payer: 59 | Admitting: Cardiovascular Disease

## 2017-12-22 ENCOUNTER — Emergency Department: Payer: 59

## 2017-12-22 ENCOUNTER — Other Ambulatory Visit: Payer: Self-pay

## 2017-12-22 ENCOUNTER — Observation Stay
Admission: EM | Admit: 2017-12-22 | Discharge: 2017-12-23 | Disposition: A | Discharge status: Home / Self Care | Payer: 59 | Attending: Internal Medicine | Admitting: Internal Medicine

## 2017-12-22 ENCOUNTER — Ambulatory Visit (INDEPENDENT_AMBULATORY_CARE_PROVIDER_SITE_OTHER)
Admission: EM | Admit: 2017-12-22 | Discharge: 2017-12-22 | Disposition: A | Discharge status: Other Acute Inpatient Hospital | Payer: 59 | Source: Home / Self Care | Attending: Family Medicine | Admitting: Family Medicine

## 2017-12-22 ENCOUNTER — Encounter: Payer: Self-pay | Admitting: Emergency Medicine

## 2017-12-22 DIAGNOSIS — Z8673 Personal history of transient ischemic attack (TIA), and cerebral infarction without residual deficits: Secondary | ICD-10-CM | POA: Diagnosis present

## 2017-12-22 DIAGNOSIS — I700 Atherosclerosis of aorta: Secondary | ICD-10-CM | POA: Diagnosis not present

## 2017-12-22 DIAGNOSIS — M6281 Muscle weakness (generalized): Secondary | ICD-10-CM | POA: Diagnosis not present

## 2017-12-22 DIAGNOSIS — Z87891 Personal history of nicotine dependence: Secondary | ICD-10-CM | POA: Insufficient documentation

## 2017-12-22 DIAGNOSIS — I10 Essential (primary) hypertension: Secondary | ICD-10-CM | POA: Diagnosis not present

## 2017-12-22 DIAGNOSIS — R4781 Slurred speech: Secondary | ICD-10-CM

## 2017-12-22 DIAGNOSIS — Z79899 Other long term (current) drug therapy: Secondary | ICD-10-CM | POA: Insufficient documentation

## 2017-12-22 DIAGNOSIS — E7800 Pure hypercholesterolemia, unspecified: Secondary | ICD-10-CM | POA: Insufficient documentation

## 2017-12-22 DIAGNOSIS — E041 Nontoxic single thyroid nodule: Secondary | ICD-10-CM | POA: Diagnosis not present

## 2017-12-22 DIAGNOSIS — I639 Cerebral infarction, unspecified: Secondary | ICD-10-CM | POA: Diagnosis not present

## 2017-12-22 DIAGNOSIS — I6529 Occlusion and stenosis of unspecified carotid artery: Secondary | ICD-10-CM | POA: Diagnosis not present

## 2017-12-22 DIAGNOSIS — R29898 Other symptoms and signs involving the musculoskeletal system: Secondary | ICD-10-CM

## 2017-12-22 DIAGNOSIS — Q282 Arteriovenous malformation of cerebral vessels: Secondary | ICD-10-CM | POA: Insufficient documentation

## 2017-12-22 DIAGNOSIS — G459 Transient cerebral ischemic attack, unspecified: Principal | ICD-10-CM | POA: Insufficient documentation

## 2017-12-22 DIAGNOSIS — E039 Hypothyroidism, unspecified: Secondary | ICD-10-CM | POA: Insufficient documentation

## 2017-12-22 DIAGNOSIS — R200 Anesthesia of skin: Secondary | ICD-10-CM

## 2017-12-22 DIAGNOSIS — R4701 Aphasia: Secondary | ICD-10-CM | POA: Diagnosis not present

## 2017-12-22 DIAGNOSIS — E559 Vitamin D deficiency, unspecified: Secondary | ICD-10-CM | POA: Insufficient documentation

## 2017-12-22 DIAGNOSIS — Z23 Encounter for immunization: Secondary | ICD-10-CM | POA: Insufficient documentation

## 2017-12-22 DIAGNOSIS — R7303 Prediabetes: Secondary | ICD-10-CM | POA: Insufficient documentation

## 2017-12-22 DIAGNOSIS — Z7902 Long term (current) use of antithrombotics/antiplatelets: Secondary | ICD-10-CM | POA: Diagnosis not present

## 2017-12-22 DIAGNOSIS — Q211 Atrial septal defect: Secondary | ICD-10-CM | POA: Diagnosis not present

## 2017-12-22 LAB — PROTIME-INR
INR: 0.87
Prothrombin Time: 11.8 seconds (ref 11.4–15.2)

## 2017-12-22 LAB — COMPREHENSIVE METABOLIC PANEL
ALT: 21 U/L (ref 0–44)
AST: 22 U/L (ref 15–41)
Albumin: 4.3 g/dL (ref 3.5–5.0)
Alkaline Phosphatase: 82 U/L (ref 38–126)
Anion gap: 6 (ref 5–15)
BILIRUBIN TOTAL: 0.4 mg/dL (ref 0.3–1.2)
BUN: 17 mg/dL (ref 6–20)
CO2: 28 mmol/L (ref 22–32)
CREATININE: 0.62 mg/dL (ref 0.44–1.00)
Calcium: 9.4 mg/dL (ref 8.9–10.3)
Chloride: 106 mmol/L (ref 98–111)
GFR calc Af Amer: >60 mL/min (ref >60)
GFR calc non Af Amer: >60 mL/min (ref >60)
Glucose, Bld: 110 mg/dL — ABNORMAL HIGH (ref 70–99)
POTASSIUM: 4 mmol/L (ref 3.5–5.1)
Sodium: 140 mmol/L (ref 135–145)
TOTAL PROTEIN: 7.5 g/dL (ref 6.5–8.1)

## 2017-12-22 LAB — DIFFERENTIAL
ABS IMMATURE GRANULOCYTES: 0.01 10*3/uL (ref 0.00–0.07)
Basophils Absolute: 0 10*3/uL (ref 0.0–0.1)
Basophils Relative: 0 %
EOS ABS: 0.1 10*3/uL (ref 0.0–0.5)
Eosinophils Relative: 1 %
IMMATURE GRANULOCYTES: 0 %
LYMPHS ABS: 2.8 10*3/uL (ref 0.7–4.0)
Lymphocytes Relative: 50 %
Monocytes Absolute: 0.4 10*3/uL (ref 0.1–1.0)
Monocytes Relative: 8 %
NEUTROS ABS: 2.3 10*3/uL (ref 1.7–7.7)
Neutrophils Relative %: 41 %

## 2017-12-22 LAB — TROPONIN I: Troponin I: <0.03 ng/mL (ref <0.03)

## 2017-12-22 LAB — TSH: TSH: 0.716 u[IU]/mL (ref 0.350–4.500)

## 2017-12-22 LAB — CBC
HEMATOCRIT: 37.5 % (ref 36.0–46.0)
HEMOGLOBIN: 12.3 g/dL (ref 12.0–15.0)
MCH: 28.8 pg (ref 26.0–34.0)
MCHC: 32.8 g/dL (ref 30.0–36.0)
MCV: 87.8 fL (ref 80.0–100.0)
NRBC: 0 % (ref 0.0–0.2)
Platelets: 290 10*3/uL (ref 150–400)
RBC: 4.27 MIL/uL (ref 3.87–5.11)
RDW: 13.2 % (ref 11.5–15.5)
WBC: 5.6 10*3/uL (ref 4.0–10.5)

## 2017-12-22 LAB — GLUCOSE, CAPILLARY
Glucose-Capillary: 94 mg/dL (ref 70–99)
Glucose-Capillary: 95 mg/dL (ref 70–99)

## 2017-12-22 LAB — APTT: aPTT: 29 seconds (ref 24–36)

## 2017-12-22 MED ORDER — AMLODIPINE BESYLATE 10 MG PO TABS
10.0000 mg | ORAL_TABLET | Freq: Every day | ORAL | Status: DC
  Administered 2017-12-23: 10 mg via ORAL
  Filled 2017-12-22: qty 1

## 2017-12-22 MED ORDER — STROKE: EARLY STAGES OF RECOVERY BOOK
Freq: Once | Status: AC
  Administered 2017-12-23: 03:00:00

## 2017-12-22 MED ORDER — ACETAMINOPHEN 325 MG PO TABS: 650.0000 mg | ORAL_TABLET | ORAL | Status: DC | PRN

## 2017-12-22 MED ORDER — IOHEXOL 350 MG/ML SOLN
75.0000 mL | Freq: Once | INTRAVENOUS | Status: AC | PRN
  Administered 2017-12-22: 75 mL via INTRAVENOUS

## 2017-12-22 MED ORDER — ACETAMINOPHEN 160 MG/5ML PO SOLN
650.0000 mg | ORAL | Status: DC | PRN
  Filled 2017-12-22: qty 20.3

## 2017-12-22 MED ORDER — ENOXAPARIN SODIUM 40 MG/0.4ML ~~LOC~~ SOLN: 40.0000 mg | SUBCUTANEOUS | Status: DC

## 2017-12-22 MED ORDER — CLOPIDOGREL BISULFATE 75 MG PO TABS
75.0000 mg | ORAL_TABLET | Freq: Every day | ORAL | Status: DC
  Administered 2017-12-23: 11:00:00 75 mg via ORAL
  Filled 2017-12-22: qty 1

## 2017-12-22 MED ORDER — ATORVASTATIN CALCIUM 20 MG PO TABS: 40.0000 mg | ORAL_TABLET | Freq: Every day | ORAL | Status: DC

## 2017-12-22 MED ORDER — SENNOSIDES-DOCUSATE SODIUM 8.6-50 MG PO TABS: 1.0000 | ORAL_TABLET | Freq: Every evening | ORAL | Status: DC | PRN

## 2017-12-22 MED ORDER — ASPIRIN 81 MG PO CHEW
81.0000 mg | CHEWABLE_TABLET | Freq: Every day | ORAL | Status: DC
  Administered 2017-12-23: 81 mg via ORAL
  Filled 2017-12-22: qty 1

## 2017-12-22 MED ORDER — INFLUENZA VAC SPLIT QUAD 0.5 ML IM SUSY
0.5000 mL | PREFILLED_SYRINGE | INTRAMUSCULAR | Status: AC
  Administered 2017-12-23: 0.5 mL via INTRAMUSCULAR
  Filled 2017-12-22: qty 0.5

## 2017-12-22 MED ORDER — ACETAMINOPHEN 650 MG RE SUPP: 650.0000 mg | RECTAL | Status: DC | PRN

## 2017-12-22 MED ORDER — PNEUMOCOCCAL VAC POLYVALENT 25 MCG/0.5ML IJ INJ
0.5000 mL | INJECTION | INTRAMUSCULAR | Status: AC
  Administered 2017-12-23: 11:00:00 0.5 mL via INTRAMUSCULAR
  Filled 2017-12-22: qty 0.5

## 2017-12-22 NOTE — H&P (Signed)
[facility] [place] NAME: Alyssa Matthews    MR#:  [id number]  DATE OF BIRTH:  [birth year]  DATE OF ADMISSION:  12/22/2017  PRIMARY CARE PHYSICIAN: Leone Haven, MD   REQUESTING/REFERRING PHYSICIAN: Dr. Mariea Clonts.  CHIEF COMPLAINT:  No chief complaint on file.  Slurred speech and left leg weakness since 6 PM today. HISTORY OF PRESENT ILLNESS:  Alyssa Matthews  is a 57 y.o. female with a known history of hypertension, hyperlipidemia, hypothyroidism, PFO, TIA and CVA.  The patient presents the ED with above chief complaints.  She started to have slurred speech and the left leg numbness and weakness at 6 PM.  Slurred speech, left leg numbness and weakness have completely resolved.  She denies any other symptoms.  CT head and angiogram is unremarkable.  Telemetry neurologist to suggest CVA work-up including MRI of the brain.  Patient took Plavix this morning.  PAST MEDICAL HISTORY:   Past Medical History:  Diagnosis Date  . History of blood transfusion   . Hypercholesteremia   . Hypertension   . Hyperthyroidism    a. s/p RAI  . Lacunar infarction ([place])    a. MRI brain 6/19: remote lacunar infarcts involving the pons  . PFO (patent foramen ovale)    a. TTE 6/19: EF 60-65%, no RWMA, Gr1DD, mild MR, RVSF normal, patent foramen ovale was noted with a positive saline contrast study, PASP normal  . TIA (transient ischemic attack) 08/2017  . Wears dentures    partial upper    PAST SURGICAL HISTORY:   Past Surgical History:  Procedure Laterality Date  . BREAST CYST ASPIRATION  2015  . COLONOSCOPY WITH PROPOFOL N/A 03/30/2015   Procedure: COLONOSCOPY WITH PROPOFOL;  Surgeon: Lucilla Lame, MD;  Location: [facility];  Service: Endoscopy;  Laterality: N/A;  . DILATION AND CURETTAGE OF UTERUS    . POLYPECTOMY  03/30/2015   Procedure: POLYPECTOMY;  Surgeon: Lucilla Lame, MD;  Location: [facility];  Service: Endoscopy;;     SOCIAL HISTORY:   Social History   Tobacco Use  . Smoking status: Former Smoker    Packs/day: 0.75    Years: 30.00    Pack years: 22.50    Types: Cigarettes    Last attempt to quit: 08/2017    Years since quitting: 0.3  . Smokeless tobacco: Never Used  Substance Use Topics  . Alcohol use: Not Currently    Alcohol/week: 0.0 standard drinks    Frequency: Never    FAMILY HISTORY:   Family History  Problem Relation Age of Onset  . Hypertension Mother   . Kidney disease Brother   . Cancer Father        In neck  . Drug abuse Paternal Grandmother   . Hypertension Brother   . Breast cancer Neg Hx   . Thyroid disease Neg Hx     DRUG ALLERGIES:  No Known Allergies  REVIEW OF SYSTEMS:   Review of Systems  Constitutional: Negative for chills, fever and malaise/fatigue.  HENT: Negative for sore throat.   Eyes: Negative for blurred vision and double vision.  Respiratory: Negative for cough, hemoptysis, shortness of breath, wheezing and stridor.   Cardiovascular: Negative for chest pain, palpitations, orthopnea and leg swelling.  Gastrointestinal: Negative for abdominal pain, blood in stool, diarrhea, melena, nausea and vomiting.  Genitourinary: Negative for dysuria, flank pain and hematuria.  Musculoskeletal: Negative for back pain and joint pain.  Skin: Negative for rash.  Neurological: Positive for sensory change, speech change, focal weakness and weakness. Negative for dizziness, tingling, tremors, seizures, loss of consciousness and headaches.  Endo/Heme/Allergies: Negative for polydipsia.  Psychiatric/Behavioral: Negative for depression. The patient is not nervous/anxious.     MEDICATIONS AT HOME:   Prior to Admission medications   Medication Sig Start Date End Date Taking? Authorizing Provider  amLODipine (NORVASC) 10 MG tablet TAKE 1 TABLET (10 MG TOTAL) BY MOUTH DAILY. 10/10/17   Leone Haven, MD  clopidogrel (PLAVIX) 75 MG tablet Take 1 tablet (75 mg  total) by mouth daily. 11/27/17   Jodelle Green, FNP  rosuvastatin (CRESTOR) 5 MG tablet Take 1 tablet (5 mg total) by mouth daily. 11/27/17   Jodelle Green, FNP  Vitamin D, Ergocalciferol, (DRISDOL) 50000 units CAPS capsule Take 1 capsule (50,000 Units total) by mouth every 7 (seven) days. 11/28/17   Jodelle Green, FNP      VITAL SIGNS:  Blood pressure (!) 133/112, pulse 66, temperature 98.1 F (36.7 C), temperature source Oral, resp. rate 20, height 5\' 4"  (1.626 m), weight 68.9 kg, SpO2 99 %.  PHYSICAL EXAMINATION:  Physical Exam  GENERAL:  57 y.o.-year-old patient lying in the bed with no acute distress.  EYES: Pupils equal, round, reactive to light and accommodation. No scleral icterus. Extraocular muscles intact.  HEENT: Head atraumatic, normocephalic. Oropharynx and nasopharynx clear.  NECK:  Supple, no jugular venous distention. No thyroid enlargement, no tenderness.  LUNGS: Normal breath sounds bilaterally, no wheezing, rales,rhonchi or crepitation. No use of accessory muscles of respiration.  CARDIOVASCULAR: S1, S2 normal. No murmurs, rubs, or gallops.  ABDOMEN: Soft, nontender, nondistended. Bowel sounds present. No organomegaly or mass.  EXTREMITIES: No pedal edema, cyanosis, or clubbing.  NEUROLOGIC: Cranial nerves II through XII are intact. Muscle strength 5/5 in all extremities. Sensation intact. Gait not checked.  PSYCHIATRIC: The patient is alert and oriented x 3.  SKIN: No obvious rash, lesion, or ulcer.   LABORATORY PANEL:   CBC Recent Labs  Lab 12/22/17 1914  WBC 5.6  HGB 12.3  HCT 37.5  PLT 290   ------------------------------------------------------------------------------------------------------------------  Chemistries  Recent Labs  Lab 12/22/17 1914  NA 140  K 4.0  CL 106  CO2 28  GLUCOSE 110*  BUN 17  CREATININE 0.62  CALCIUM 9.4  AST 22  ALT 21  ALKPHOS 82  BILITOT 0.4    ------------------------------------------------------------------------------------------------------------------  Cardiac Enzymes Recent Labs  Lab 12/22/17 1914  TROPONINI <0.03   ------------------------------------------------------------------------------------------------------------------  RADIOLOGY:  Ct Angio Head W Or Wo Contrast  Result Date: 12/22/2017 CLINICAL DATA:  LEFT-sided numbness and weakness in the leg. Dizziness. History of TIA. EXAM: CT ANGIOGRAPHY HEAD AND NECK TECHNIQUE: Multidetector CT imaging of the head and neck was performed using the standard protocol during bolus administration of intravenous contrast. Multiplanar CT image reconstructions and MIPs were obtained to evaluate the vascular anatomy. Carotid stenosis measurements (when applicable) are obtained utilizing NASCET criteria, using the distal internal carotid diameter as the denominator. CONTRAST:  20mL OMNIPAQUE IOHEXOL 350 MG/ML SOLN COMPARISON:  Code stroke CT earlier in the day.  MR head 08/31/2017. FINDINGS: CTA NECK FINDINGS Aortic arch: Standard branching. Imaged portion shows no evidence of aneurysm or dissection. No significant stenosis of the major arch vessel origins. Aortic atherosclerosis. Right carotid system: No evidence of dissection, stenosis (50% or greater) or occlusion. Minor calcific atheromatous change. Left carotid system: No evidence of dissection, stenosis (50% or greater) or occlusion. Minor calcific atheromatous change. Vertebral arteries:  Codominant. No evidence of dissection, stenosis (50% or greater) or occlusion. Skeleton: Spondylosis. Reversal of normal cervical lordotic curve. C4 sclerotic lesion, likely hemangioma. Other neck: Large LEFT thyroid lesion, extending to the isthmus, heterogeneous attenuation, 27 x 15 x 23 mm, not clearly calcified. Upper chest: No mass or pneumothorax. Review of the MIP images confirms the above findings CTA HEAD FINDINGS Anterior circulation: No  significant stenosis, proximal occlusion, aneurysm, or vascular malformation. Minor calcific change in the cavernous carotids bilaterally. Posterior circulation: No significant stenosis, proximal occlusion, aneurysm, or vascular malformation. Venous sinuses: As permitted by contrast timing, patent. Anatomic variants: None of significance. Delayed phase: No abnormal intracranial enhancement. Review of the MIP images confirms the above findings IMPRESSION: Minor intracranial and extracranial atherosclerotic change, but no large vessel occlusion. Basilar artery widely patent. No abnormal postcontrast enhancement. 27 x 15 x 23 mm heterogeneous LEFT thyroid nodule extending to the isthmus. Consider further evaluation with thyroid ultrasound. If patient is clinically hyperthyroid, consider nuclear medicine thyroid uptake and scan. Electronically Signed   By: Staci Righter M.D.   On: 12/22/2017 20:18   Ct Angio Neck W And/or Wo Contrast  Result Date: 12/22/2017 CLINICAL DATA:  LEFT-sided numbness and weakness in the leg. Dizziness. History of TIA. EXAM: CT ANGIOGRAPHY HEAD AND NECK TECHNIQUE: Multidetector CT imaging of the head and neck was performed using the standard protocol during bolus administration of intravenous contrast. Multiplanar CT image reconstructions and MIPs were obtained to evaluate the vascular anatomy. Carotid stenosis measurements (when applicable) are obtained utilizing NASCET criteria, using the distal internal carotid diameter as the denominator. CONTRAST:  84mL OMNIPAQUE IOHEXOL 350 MG/ML SOLN COMPARISON:  Code stroke CT earlier in the day.  MR head 08/31/2017. FINDINGS: CTA NECK FINDINGS Aortic arch: Standard branching. Imaged portion shows no evidence of aneurysm or dissection. No significant stenosis of the major arch vessel origins. Aortic atherosclerosis. Right carotid system: No evidence of dissection, stenosis (50% or greater) or occlusion. Minor calcific atheromatous change. Left  carotid system: No evidence of dissection, stenosis (50% or greater) or occlusion. Minor calcific atheromatous change. Vertebral arteries: Codominant. No evidence of dissection, stenosis (50% or greater) or occlusion. Skeleton: Spondylosis. Reversal of normal cervical lordotic curve. C4 sclerotic lesion, likely hemangioma. Other neck: Large LEFT thyroid lesion, extending to the isthmus, heterogeneous attenuation, 27 x 15 x 23 mm, not clearly calcified. Upper chest: No mass or pneumothorax. Review of the MIP images confirms the above findings CTA HEAD FINDINGS Anterior circulation: No significant stenosis, proximal occlusion, aneurysm, or vascular malformation. Minor calcific change in the cavernous carotids bilaterally. Posterior circulation: No significant stenosis, proximal occlusion, aneurysm, or vascular malformation. Venous sinuses: As permitted by contrast timing, patent. Anatomic variants: None of significance. Delayed phase: No abnormal intracranial enhancement. Review of the MIP images confirms the above findings IMPRESSION: Minor intracranial and extracranial atherosclerotic change, but no large vessel occlusion. Basilar artery widely patent. No abnormal postcontrast enhancement. 27 x 15 x 23 mm heterogeneous LEFT thyroid nodule extending to the isthmus. Consider further evaluation with thyroid ultrasound. If patient is clinically hyperthyroid, consider nuclear medicine thyroid uptake and scan. Electronically Signed   By: Staci Righter M.D.   On: 12/22/2017 20:18   Ct Head Code Stroke Wo Contrast  Result Date: 12/22/2017 CLINICAL DATA:  Code stroke. Left-sided numbness. Weakness in left lower extremity. Abnormal speech. Symptoms began 1 hour ago. EXAM: CT HEAD WITHOUT CONTRAST TECHNIQUE: Contiguous axial images were obtained from the base of the skull through the vertex without intravenous  contrast. COMPARISON:  None. FINDINGS: Brain: No acute infarct, hemorrhage, or mass lesion is present. Basal  ganglia are within normal limits. Insular cortex is normal. No significant white matter disease is present. Remote central pontine infarct is stable. Cerebellum is within normal limits. Vascular: The basilar artery is hyperdense compared to the anterior circulation. Skull: Calvarium is intact. No focal lytic or blastic lesions are present. Sinuses/Orbits: The paranasal sinuses and mastoid air cells are clear. ASPECTS [facility] Stroke Program Early CT Score) - Ganglionic level infarction (caudate, lentiform nuclei, internal capsule, insula, M1-M3 cortex): 7/7 - Supraganglionic infarction (M4-M6 cortex): 3/3 Total score (0-10 with 10 being normal): 10/10 IMPRESSION: 1. Hyperdense basilar artery concerning for slow or occluded flow. Recommend CTA head and neck for further evaluation. 2. Remote central pontine infarct. 3. ASPECTS is 10/10 These results were called by telephone at the time of interpretation on 12/22/2017 at 7:20 pm to Dr. Carrie Mew , who verbally acknowledged these results. Electronically Signed   By: San Morelle M.D.   On: 12/22/2017 19:22      IMPRESSION AND PLAN:   TIA. The patient will be placed for observation. Continue Plavix, and aspirin, hold Crestor and start Lipitor, neuro check, MRI of the brain, echocardiograph.  Hypertension.  Continue home hypertension medication. Hyperlipidemia.  Lipitor at bedtime. History of CVA.  Continue Plavix, aspirin and Lipitor.  All the records are reviewed and case discussed with ED provider. Management plans discussed with the patient, family and they are in agreement.  CODE STATUS: Full code  TOTAL TIME TAKING CARE OF THIS PATIENT: 42 minutes.    Demetrios Loll M.D on 12/22/2017 at 9:05 PM  Between 7am to 6pm - Pager - [phone]  After 6pm go to [email or web address] - Technical brewer [facility]  Office  [phone]  CC: Primary care physician; Leone Haven, MD   Note: This dictation  was prepared with Dragon dictation along with smaller phrase technology. Any transcriptional errors that result from this process are unin

## 2017-12-22 NOTE — Progress Notes (Signed)
CODE STROKE- PHARMACY COMMUNICATION   Time CODE STROKE called/page received: 19:06  Time response to CODE STROKE was made (in person or via phone): Called the number in the page ([phone]) no answer. Please contact pharmacy for any drug needs.  Time Stroke Kit retrieved from [place] (only if needed):  Name of Provider/Nurse contacted:  Past Medical History:  Diagnosis Date  . History of blood transfusion   . Hypercholesteremia   . Hypertension   . Hyperthyroidism    a. s/p RAI  . Lacunar infarction ([place])    a. MRI brain 6/19: remote lacunar infarcts involving the pons  . PFO (patent foramen ovale)    a. TTE 6/19: EF 60-65%, no RWMA, Gr1DD, mild MR, RVSF normal, patent foramen ovale was noted with a positive saline contrast study, PASP normal  . TIA (transient ischemic attack) 08/2017  . Wears dentures    partial upper   Prior to Admission medications   Medication Sig Start Date End Date Taking? Authorizing Provider  amLODipine (NORVASC) 10 MG tablet TAKE 1 TABLET (10 MG TOTAL) BY MOUTH DAILY. 10/10/17   Leone Haven, MD  clopidogrel (PLAVIX) 75 MG tablet Take 1 tablet (75 mg total) by mouth daily. 11/27/17   Jodelle Green, FNP  rosuvastatin (CRESTOR) 5 MG tablet Take 1 tablet (5 mg total) by mouth daily. 11/27/17   Jodelle Green, FNP  Vitamin D, Ergocalciferol, (DRISDOL) 50000 units CAPS capsule Take 1 capsule (50,000 Units total) by mouth every 7 (seven) days. 11/28/17   Jodelle Green, FNP    Laural Benes, Pharm.D., BCPS Clinical Pharmacist  12/22/2017  7:08 PM

## 2017-12-22 NOTE — Progress Notes (Addendum)
CTA head reviewed, no basilar occlusion, no role for NIR Thyroid nodule visualized, fu with primary team with further imaging and ultrasound.   [facility] Tele[facility]urology Consult Services    Date of Service:   12/22/2017 19:37:59  Impression:      .  RO Acute Ischemic Stroke     .  Right Hemispheric  Comments: Patient with HTN, prior pontine stroke, HLD who presents with left sided numbness and dysarthria. CT head aspects of 10, concern for dense basilar sign. CTA head and neck performed and are pending. NIHSS 0, patient improved, no indication for tpa given nonfocal exam. Discussed with patient and family at bedside who agree with plan. case d/w ED attending as well  Mechanism of Stroke: Small Vessel Disease  Metrics: Last Known Well: 12/23/2017 18:00:00 [facility] Notification Time: 12/22/2017 19:35:50 Arrival Time: 12/23/2017 18:55:00 Stamp Time: 12/22/2017 19:37:59 Time First Login Attempt: 12/23/2017 19:46:00 Video Start Time: 12/23/2017 19:46:00  Symptoms: dysarthria NIHSS Start Assessment Time: 12/23/2017 20:07:00 Patient is not a candidate for tPA. Patient was not deemed candidate for tPA thrombolytics because of patient improved. . Video End Time: 12/23/2017 20:22:00  CT head showed no acute hemorrhage or acute core infarct. CT head was reviewed.  Advanced imaging is to be reviewed by ED physician and NIR. Advanced imaging CTA head and neck obtained.  ER physician notified of the decision on thrombolytics management. ER physician recommended to consult neurointerventionalist physician if the advanced imaging suggestive of Large Vessel Occlusive Thrombotic Disease.  Our recommendations are outlined below.  Recommendations:     .  continue asa admit for routine stroke workup (MRI brain , 2d echo) permissive HTN  Routine Consultation with [facility] for Follow up Care  Sign Out:     .  Discussed with Emergency Department  Provider    ------------------------------------------------------------------------------  History of Present Illness:  Patient is a 57 years old Female.  Patient was brought by EMS for symptoms of dysarthria  Patient is a 57 yr old woman with hx of hypertension, hyperlypedemia, vitamin D deficiency, TIA who presents with symptoms of left leg numbness and slurred speech. She noticed these symptoms at 6pm this evening. She was at work when she noted the symptoms, no trouble ambulating. No headaches, no dizziness, no CP, dysphagia. No blurry vision, no symptoms on the right. She is on plavix at home. No hx of Afib. She noticed her symptoms while at break at 6pm.  CT head showed no acute hemorrhage or acute core infarct. CT head was reviewed.  There is no history of hemorrhagic complications or intracranial hemorrhage. There is no history of Recent Anticoagulants. There is no history of recent major surgery. There is no history of recent stroke.  Examination:  1A: Level of Consciousness - Alert; keenly responsive + 0 1B: Ask Month and Age - Both Questions Right + 0 1C: Blink Eyes & Squeeze Hands - Performs Both Tasks + 0 2: Test Horizontal Extraocular Movements - Normal + 0 3: Test Visual Fields - No Visual Loss + 0 4: Test Facial Palsy (Use Grimace if Obtunded) - Normal symmetry + 0 5A: Test Left Arm Motor Drift - No Drift for 10 Seconds + 0 5B: Test Right Arm Motor Drift - No Drift for 10 Seconds + 0 6A: Test Left Leg Motor Drift - No Drift for 5 Seconds + 0 6B: Test Right Leg Motor Drift - No Drift for 5 Seconds + 0 7: Test Limb Ataxia (FNF/Heel-Shin) - No Ataxia + 0  8: Test Sensation - Normal; No sensory loss + 0 9: Test Language/Aphasia - Normal; No aphasia + 0 10: Test Dysarthria - Normal + 0 11: Test Extinction/Inattention - No abnormality + 0  NIHSS Score: 0  Patient was informed the Neurology Consult would happen via TeleHealth consult by way of interactive audio and  video telecommunications and consented to receiving care in this manner.  Due to the immediate potential for life-threatening deterioration due to underlying acute neurologic illness, I spent 35 minutes providing critical care. This time includes time for face to face visit via telemedicine, review of medical records, imaging studies and discussion of findings with providers, the patient and/or family.   Dr Jacqulynn Cadet Zade Falkner   [facility] [phone]

## 2017-12-22 NOTE — ED Notes (Signed)
Pt returned to room from ct at this time , pt remains a/o X3 ,

## 2017-12-22 NOTE — ED Notes (Signed)
Glucose - 95

## 2017-12-22 NOTE — ED Triage Notes (Signed)
Patient complains of left side numbness and weakness in her leg, change in voice and dizziness started around 6pm. Patient states that she was diagnosed with TIA in June. States that symptoms feel the same but not to the intensity as previous.

## 2017-12-22 NOTE — Progress Notes (Signed)
   12/22/17 2000  Clinical Encounter Type  Visited With Patient  Visit Type Code (Stroke)  Referral From Nurse  Recommendations Follow-up, as requested.  Spiritual Encounters  Spiritual Needs Emotional  Stress Factors  Patient Stress Factors Health changes   Chaplain responded to the Code Stroke and found patient alert, receiving care, and talking with staff. Chaplain learned that no family or friends were present. Chaplain assured patient that pastoral care was available at any time. Patient did not indicate a current need. After speaking with nurse about returning if needed, Chaplain ended the encounter.

## 2017-12-22 NOTE — ED Provider Notes (Signed)
[facility] Emergency Department Provider Note  ____________________________________________  Time seen: Approximately 7:36 PM  I have reviewed the triage vital signs and the nursing notes.   HISTORY  Chief Complaint No chief complaint on file.    HPI Alyssa Matthews is a 57 y.o. female with a prior history of pontine stroke on Plavix, PFO, HTN, HL, presenting for slurred speech and left leg numbness and weakness.  The patient reports that at 6 PM she was at work when she had the acute onset of left leg numbness and weakness.  Sometime later after that, she developed slurred speech.  The left leg numbness and weakness have completely resolved and she feels like her speech is better but not completely normal.  She denies any headache, visual changes, mental status changes.  No recent changes in her medications.  No recent illness.   Past Medical History:  Diagnosis Date  . History of blood transfusion   . Hypercholesteremia   . Hypertension   . Hyperthyroidism    a. s/p RAI  . Lacunar infarction ([place])    a. MRI brain 6/19: remote lacunar infarcts involving the pons  . PFO (patent foramen ovale)    a. TTE 6/19: EF 60-65%, no RWMA, Gr1DD, mild MR, RVSF normal, patent foramen ovale was noted with a positive saline contrast study, PASP normal  . TIA (transient ischemic attack) 08/2017  . Wears dentures    partial upper    Patient Active Problem List   Diagnosis Date Noted  . PFO (patent foramen ovale) 09/15/2017  . TIA (transient ischemic attack) 08/30/2017  . Chronic left shoulder pain 05/17/2017  . Sebaceous cyst of labia 12/05/2016  . Vulvar lesion 10/31/2016  . Rib contusion, left, initial encounter 06/28/2016  . Subclinical hyperthyroidism 06/28/2016  . Dyspnea on exertion 04/18/2016  . Elevated LDL cholesterol level 06/29/2015  . Bilateral numbness of feet 06/29/2015  . Tobacco abuse 05/18/2015  . Prediabetes 05/18/2015  . Lipoma 04/16/2015   . Benign neoplasm of sigmoid colon   . Shoulder pain, bilateral 01/15/2015  . Essential hypertension 01/01/2015    Past Surgical History:  Procedure Laterality Date  . BREAST CYST ASPIRATION  2015  . COLONOSCOPY WITH PROPOFOL N/A 03/30/2015   Procedure: COLONOSCOPY WITH PROPOFOL;  Surgeon: Lucilla Lame, MD;  Location: [place];  Service: Endoscopy;  Laterality: N/A;  . DILATION AND CURETTAGE OF UTERUS    . POLYPECTOMY  03/30/2015   Procedure: POLYPECTOMY;  Surgeon: Lucilla Lame, MD;  Location: [place];  Service: Endoscopy;;    Current Outpatient Rx  . Order #: [id number] Class: Normal  . Order #: [id number] Class: Normal  . Order #: [id number] Class: Normal  . Reflex Order#: [id number] (YHC#:[id number])DVVOH: Normal    Allergies Patient has no known allergies.  Family History  Problem Relation Age of Onset  . Hypertension Mother   . Kidney disease Brother   . Cancer Father        In neck  . Drug abuse Paternal Grandmother   . Hypertension Brother   . Breast cancer Neg Hx   . Thyroid disease Neg Hx     Social History Social History   Tobacco Use  . Smoking status: Former Smoker    Packs/day: 0.75    Years: 30.00    Pack years: 22.50    Types: Cigarettes    Last attempt to quit: 08/2017    Years since quitting: 0.3  . Smokeless tobacco: Never Used  Substance Use Topics  .  Alcohol use: Not Currently    Alcohol/week: 0.0 standard drinks    Frequency: Never  . Drug use: No    Review of Systems Constitutional: No fever/chills.  No lightheadedness or syncope. Eyes: No visual changes.  No blurred or double vision. ENT: No sore throat. No congestion or rhinorrhea. Cardiovascular: Denies chest pain. Denies palpitations. Respiratory: Denies shortness of breath.  No cough. Gastrointestinal: No abdominal pain.  No nausea, no vomiting.  No diarrhea.  No constipation. Genitourinary: Negative for dysuria. Musculoskeletal: Negative for back pain. Skin:  Negative for rash. Neurological: Negative for headaches.  Positive for slurred speech and left leg numbness and tingling.  No visual changes or mental status changes.      ____________________________________________   PHYSICAL EXAM:  VITAL SIGNS: ED Triage Vitals  Enc Vitals Group     BP 12/22/17 1901 (!) 135/98     Pulse Rate 12/22/17 1901 78     Resp 12/22/17 1901 16     Temp 12/22/17 1901 98.1 F (36.7 C)     Temp Source 12/22/17 1901 Oral     SpO2 12/22/17 1901 100 %     Weight 12/22/17 1902 152 lb (68.9 kg)     Height 12/22/17 1902 5\' 4"  (1.626 m)     Head Circumference --      Peak Flow --      Pain Score 12/22/17 1902 0     Pain Loc --      Pain Edu? --      Excl. in [place]? --     Constitutional: Alert and oriented.  Answers questions appropriately.  The patient is anxious appearing. Eyes: Conjunctivae are normal.  EOMI. PERRLA.  No scleral icterus. Head: Atraumatic. Nose: No congestion/rhinnorhea. Mouth/Throat: Mucous membranes are moist.  Neck: No stridor.  Supple.  No JVD.  No meningismus. Cardiovascular: Normal rate, regular rhythm. No murmurs, rubs or gallops.  Respiratory: Normal respiratory effort.  No accessory muscle use or retractions. Lungs CTAB.  No wheezes, rales or ronchi. Gastrointestinal: Soft, nontender and nondistended.  No guarding or rebound.  No peritoneal signs. Musculoskeletal: No LE edema. No ttp in the calves or palpable cords.  Negative Homan's sign. Neurologic: Alert and oriented 3. Speech is clear. Naming and repetition are intact. Face and smile symmetric. Tongue is midline. Normal cheek puff.  EOMI.  PERRLA.  No vertical or horizontal nystagmus.  Normal visual fields.  No pronator drift. 5 out of 5 grip, biceps, triceps, hip flexors, plantar flexion and dorsiflexion. Normal sensation to light touch in the bilateral upper and lower extremities, and face. Normal gait without ataxia peer Skin:  Skin is warm, dry and intact. No rash  noted. Psychiatric: Mood and affect are normal. Speech and behavior are normal.  Normal judgement  ____________________________________________   LABS (all labs ordered are listed, but only abnormal results are displayed)  Labs Reviewed  COMPREHENSIVE METABOLIC PANEL - Abnormal; Notable for the following components:      Result Value   Glucose, Bld 110 (*)    All other components within normal limits  PROTIME-INR  APTT  CBC  DIFFERENTIAL  TROPONIN I  GLUCOSE, CAPILLARY  CBG MONITORING, ED  POC URINE PREG, ED   ____________________________________________  EKG  ED ECG REPORT I, Anne-Caroline Mariea Clonts, the attending physician, personally viewed and interpreted this ECG.   Date: 12/22/2017  EKG Time: 1932  Rate: 73  Rhythm: normal sinus rhythm  Axis: normal  Intervals:none  ST&T Change: No STEMI  ____________________________________________  RADIOLOGY  Ct Angio Head W Or Wo Contrast  Result Date: 12/22/2017 CLINICAL DATA:  LEFT-sided numbness and weakness in the leg. Dizziness. History of TIA. EXAM: CT ANGIOGRAPHY HEAD AND NECK TECHNIQUE: Multidetector CT imaging of the head and neck was performed using the standard protocol during bolus administration of intravenous contrast. Multiplanar CT image reconstructions and MIPs were obtained to evaluate the vascular anatomy. Carotid stenosis measurements (when applicable) are obtained utilizing NASCET criteria, using the distal internal carotid diameter as the denominator. CONTRAST:  96mL OMNIPAQUE IOHEXOL 350 MG/ML SOLN COMPARISON:  Code stroke CT earlier in the day.  MR head 08/31/2017. FINDINGS: CTA NECK FINDINGS Aortic arch: Standard branching. Imaged portion shows no evidence of aneurysm or dissection. No significant stenosis of the major arch vessel origins. Aortic atherosclerosis. Right carotid system: No evidence of dissection, stenosis (50% or greater) or occlusion. Minor calcific atheromatous change. Left carotid system: No  evidence of dissection, stenosis (50% or greater) or occlusion. Minor calcific atheromatous change. Vertebral arteries: Codominant. No evidence of dissection, stenosis (50% or greater) or occlusion. Skeleton: Spondylosis. Reversal of normal cervical lordotic curve. C4 sclerotic lesion, likely hemangioma. Other neck: Large LEFT thyroid lesion, extending to the isthmus, heterogeneous attenuation, 27 x 15 x 23 mm, not clearly calcified. Upper chest: No mass or pneumothorax. Review of the MIP images confirms the above findings CTA HEAD FINDINGS Anterior circulation: No significant stenosis, proximal occlusion, aneurysm, or vascular malformation. Minor calcific change in the cavernous carotids bilaterally. Posterior circulation: No significant stenosis, proximal occlusion, aneurysm, or vascular malformation. Venous sinuses: As permitted by contrast timing, patent. Anatomic variants: None of significance. Delayed phase: No abnormal intracranial enhancement. Review of the MIP images confirms the above findings IMPRESSION: Minor intracranial and extracranial atherosclerotic change, but no large vessel occlusion. Basilar artery widely patent. No abnormal postcontrast enhancement. 27 x 15 x 23 mm heterogeneous LEFT thyroid nodule extending to the isthmus. Consider further evaluation with thyroid ultrasound. If patient is clinically hyperthyroid, consider nuclear medicine thyroid uptake and scan. Electronically Signed   By: Staci Righter M.D.   On: 12/22/2017 20:18   Ct Angio Neck W And/or Wo Contrast  Result Date: 12/22/2017 CLINICAL DATA:  LEFT-sided numbness and weakness in the leg. Dizziness. History of TIA. EXAM: CT ANGIOGRAPHY HEAD AND NECK TECHNIQUE: Multidetector CT imaging of the head and neck was performed using the standard protocol during bolus administration of intravenous contrast. Multiplanar CT image reconstructions and MIPs were obtained to evaluate the vascular anatomy. Carotid stenosis measurements  (when applicable) are obtained utilizing NASCET criteria, using the distal internal carotid diameter as the denominator. CONTRAST:  30mL OMNIPAQUE IOHEXOL 350 MG/ML SOLN COMPARISON:  Code stroke CT earlier in the day.  MR head 08/31/2017. FINDINGS: CTA NECK FINDINGS Aortic arch: Standard branching. Imaged portion shows no evidence of aneurysm or dissection. No significant stenosis of the major arch vessel origins. Aortic atherosclerosis. Right carotid system: No evidence of dissection, stenosis (50% or greater) or occlusion. Minor calcific atheromatous change. Left carotid system: No evidence of dissection, stenosis (50% or greater) or occlusion. Minor calcific atheromatous change. Vertebral arteries: Codominant. No evidence of dissection, stenosis (50% or greater) or occlusion. Skeleton: Spondylosis. Reversal of normal cervical lordotic curve. C4 sclerotic lesion, likely hemangioma. Other neck: Large LEFT thyroid lesion, extending to the isthmus, heterogeneous attenuation, 27 x 15 x 23 mm, not clearly calcified. Upper chest: No mass or pneumothorax. Review of the MIP images confirms the above findings CTA HEAD FINDINGS Anterior circulation: No significant stenosis, proximal  occlusion, aneurysm, or vascular malformation. Minor calcific change in the cavernous carotids bilaterally. Posterior circulation: No significant stenosis, proximal occlusion, aneurysm, or vascular malformation. Venous sinuses: As permitted by contrast timing, patent. Anatomic variants: None of significance. Delayed phase: No abnormal intracranial enhancement. Review of the MIP images confirms the above findings IMPRESSION: Minor intracranial and extracranial atherosclerotic change, but no large vessel occlusion. Basilar artery widely patent. No abnormal postcontrast enhancement. 27 x 15 x 23 mm heterogeneous LEFT thyroid nodule extending to the isthmus. Consider further evaluation with thyroid ultrasound. If patient is clinically hyperthyroid,  consider nuclear medicine thyroid uptake and scan. Electronically Signed   By: Staci Righter M.D.   On: 12/22/2017 20:18   Ct Head Code Stroke Wo Contrast  Result Date: 12/22/2017 CLINICAL DATA:  Code stroke. Left-sided numbness. Weakness in left lower extremity. Abnormal speech. Symptoms began 1 hour ago. EXAM: CT HEAD WITHOUT CONTRAST TECHNIQUE: Contiguous axial images were obtained from the base of the skull through the vertex without intravenous contrast. COMPARISON:  None. FINDINGS: Brain: No acute infarct, hemorrhage, or mass lesion is present. Basal ganglia are within normal limits. Insular cortex is normal. No significant white matter disease is present. Remote central pontine infarct is stable. Cerebellum is within normal limits. Vascular: The basilar artery is hyperdense compared to the anterior circulation. Skull: Calvarium is intact. No focal lytic or blastic lesions are present. Sinuses/Orbits: The paranasal sinuses and mastoid air cells are clear. ASPECTS [facility] Stroke Program Early CT Score) - Ganglionic level infarction (caudate, lentiform nuclei, internal capsule, insula, M1-M3 cortex): 7/7 - Supraganglionic infarction (M4-M6 cortex): 3/3 Total score (0-10 with 10 being normal): 10/10 IMPRESSION: 1. Hyperdense basilar artery concerning for slow or occluded flow. Recommend CTA head and neck for further evaluation. 2. Remote central pontine infarct. 3. ASPECTS is 10/10 These results were called by telephone at the time of interpretation on 12/22/2017 at 7:20 pm to Dr. Carrie Mew , who verbally acknowledged these results. Electronically Signed   By: San Morelle M.D.   On: 12/22/2017 19:22    ____________________________________________   PROCEDURES  Procedure(s) performed: None  Procedures  Critical Care performed: Yes, see critical care note(s) ____________________________________________   INITIAL IMPRESSION / ASSESSMENT AND PLAN / ED COURSE  Pertinent labs &  imaging results that were available during my care of the patient were reviewed by me and considered in my medical decision making (see chart for details).  57 y.o. email with a prior history of stroke on Plavix presenting with slurred seat and left lower extremity weakness and numbness.  Overall, the patient is hypertensive here, but otherwise hemodynamically stable.  On my examination, her symptoms have completely resolved and she has no focal neurologic deficits.  However, I am concerned that her symptoms may indicate a stroke and a code stroke was called.  I have talked to the patient extensively about the risks and benefits of TPA, and I do not feel she would benefit from TPA given that all of her symptoms have resolved.  We will look for other causes for her symptoms including UTI or electrolyte abnormality as well.  Plan reevaluation for final disposition.  ----------------------------------------- 8:30 PM on 12/22/2017 -----------------------------------------  The patient did undergo tele-neurology evaluation in both the tele-neurologist and I agree that with complete resolution of symptoms, TPA is not indicated at this time.  I have been able to talk to the patient about this and answer all her questions regarding TPA.  Her electrolytes are reassuring, her blood counts  are reassuring, and her CT angiogram shows a widely patent basilar artery.  There are some minor intracranial and extracranial atherosclerotic changes without any large vessel occlusion.  The patient does have an abnormal finding on her thyroid, so TSH has been sent and the patient will be admitted for full stroke evaluation at this time.   CRITICAL CARE Performed by: Eula Listen   Total critical care time: 45 minutes  Critical care time was exclusive of separately billable procedures and treating other patients.  Critical care was necessary to treat or prevent imminent or life-threatening  deterioration.  Critical care was time spent personally by me on the following activities: development of treatment plan with patient and/or surrogate as well as nursing, discussions with consultants, evaluation of patient's response to treatment, examination of patient, obtaining history from patient or surrogate, ordering and performing treatments and interventions, ordering and review of laboratory studies, ordering and review of radiographic studies, pulse oximetry and re-evaluation of patient's condition.   ____________________________________________  FINAL CLINICAL IMPRESSION(S) / ED DIAGNOSES  Final diagnoses:  Slurred speech  Left leg numbness  Thyroid nodule  Left leg weakness         NEW MEDICATIONS STARTED DURING THIS VISIT:  New Prescriptions   No medications on file      Eula Listen, MD 12/22/17 2032

## 2017-12-22 NOTE — ED Provider Notes (Signed)
[facility] ____________________________________________  Time seen: Approximately 6:38 PM  I have reviewed the triage vital signs and the nursing notes.   HISTORY  Chief Complaint Numbness   HPI Alyssa Matthews is a 57 y.o. female past medical history of TIA, lacunar infarcts, PFO currently on Plavix, presenting for evaluation of acute onset of left leg numbness sensation and voice and speech changes that started approximately 20 minutes prior to arrival at urgent care.  Reports she was brought to urgent care by coworkers.  States that her voice is hoarse sounding ,and she feels like she is having slurred speech.  Also some intermittent accompanying dizziness, not currently.  Left leg numbness sensation has since improved since initial onset.  Denies any other changes sensation, weakness, vision changes, chest pain or shortness of breath.  No sick recent trauma.  Denies known trigger.  Continues to eat and drink well.  Reports current symptoms are similar to previous TIA.  Denies any changes.  Leone Haven, MD: PCP   Past Medical History:  Diagnosis Date  . History of blood transfusion   . Hypercholesteremia   . Hypertension   . Hyperthyroidism    a. s/p RAI  . Lacunar infarction ([place])    a. MRI brain 6/19: remote lacunar infarcts involving the pons  . PFO (patent foramen ovale)    a. TTE 6/19: EF 60-65%, no RWMA, Gr1DD, mild MR, RVSF normal, patent foramen ovale was noted with a positive saline contrast study, PASP normal  . TIA (transient ischemic attack) 08/2017  . Wears dentures    partial upper    Patient Active Problem List   Diagnosis Date Noted  . PFO (patent foramen ovale) 09/15/2017  . TIA (transient ischemic attack) 08/30/2017  . Chronic left shoulder pain 05/17/2017  . Sebaceous cyst of labia 12/05/2016  . Vulvar lesion 10/31/2016  . Rib contusion, left, initial encounter 06/28/2016  . Subclinical hyperthyroidism 06/28/2016  . Dyspnea on  exertion 04/18/2016  . Elevated LDL cholesterol level 06/29/2015  . Bilateral numbness of feet 06/29/2015  . Tobacco abuse 05/18/2015  . Prediabetes 05/18/2015  . Lipoma 04/16/2015  . Benign neoplasm of sigmoid colon   . Shoulder pain, bilateral 01/15/2015  . Essential hypertension 01/01/2015    Past Surgical History:  Procedure Laterality Date  . BREAST CYST ASPIRATION  2015  . COLONOSCOPY WITH PROPOFOL N/A 03/30/2015   Procedure: COLONOSCOPY WITH PROPOFOL;  Surgeon: Lucilla Lame, MD;  Location: [place];  Service: Endoscopy;  Laterality: N/A;  . DILATION AND CURETTAGE OF UTERUS    . POLYPECTOMY  03/30/2015   Procedure: POLYPECTOMY;  Surgeon: Lucilla Lame, MD;  Location: [place];  Service: Endoscopy;;     No current facility-administered medications for this encounter.   Current Outpatient Medications:  .  amLODipine (NORVASC) 10 MG tablet, TAKE 1 TABLET (10 MG TOTAL) BY MOUTH DAILY., Disp: 90 tablet, Rfl: 1 .  clopidogrel (PLAVIX) 75 MG tablet, Take 1 tablet (75 mg total) by mouth daily., Disp: 90 tablet, Rfl: 1 .  rosuvastatin (CRESTOR) 5 MG tablet, Take 1 tablet (5 mg total) by mouth daily., Disp: 90 tablet, Rfl: 1 .  Vitamin D, Ergocalciferol, (DRISDOL) 50000 units CAPS capsule, Take 1 capsule (50,000 Units total) by mouth every 7 (seven) days., Disp: 12 capsule, Rfl: 0  Allergies Patient has no known allergies.  Family History  Problem Relation Age of Onset  . Hypertension Mother   . Kidney disease Brother   . Cancer Father  In neck  . Drug abuse Paternal Grandmother   . Hypertension Brother   . Breast cancer Neg Hx   . Thyroid disease Neg Hx     Social History Social History   Tobacco Use  . Smoking status: Former Smoker    Packs/day: 0.75    Years: 30.00    Pack years: 22.50    Types: Cigarettes    Last attempt to quit: 08/2017    Years since quitting: 0.3  . Smokeless tobacco: Never Used  Substance Use Topics  . Alcohol use:  Not Currently    Alcohol/week: 0.0 standard drinks    Frequency: Never  . Drug use: No    Review of Systems Constitutional: No fever Eyes: No visual changes. ENT: No sore throat. Cardiovascular: Denies chest pain. Respiratory: Denies shortness of breath. Gastrointestinal: No abdominal pain.   Skin: Negative for rash. Neurological: Negative for headaches. Positive sensation changes and speech changes.  ____________________________________________   PHYSICAL EXAM:  VITAL SIGNS: ED Triage Vitals  Enc Vitals Group     BP 12/22/17 1823 (!) 131/117     Pulse Rate 12/22/17 1823 68     Resp 12/22/17 1823 18     Temp 12/22/17 1823 98 F (36.7 C)     Temp Source 12/22/17 1823 Oral     SpO2 12/22/17 1823 100 %     Weight 12/22/17 1822 152 lb (68.9 kg)     Height 12/22/17 1822 5\' 4"  (1.626 m)     Head Circumference --      Peak Flow --      Pain Score 12/22/17 1822 0     Pain Loc --      Pain Edu? --      Excl. in [place]? --    Vitals:   12/22/17 1822 12/22/17 1823 12/22/17 1824  BP:  (!) 131/117 124/79  Pulse:  68   Resp:  18   Temp:  98 F (36.7 C)   TempSrc:  Oral   SpO2:  100%   Weight: 152 lb (68.9 kg)    Height: 5\' 4"  (1.626 m)       Constitutional: Alert and oriented. Well appearing and in no acute distress. Eyes: Conjunctivae are normal. PERRL.  ENT      Head: Normocephalic and atraumatic.      Nose: No congestion      Mouth/Throat: Mucous membranes are moist.Oropharynx non-erythematous. Cardiovascular: Normal rate, regular rhythm. Grossly normal heart sounds.  Good peripheral circulation. Respiratory: Normal respiratory effort without tachypnea nor retractions. Breath sounds are clear and equal bilaterally. No wheezes, rales, rhonchi. Gastrointestinal: Soft and nontender.  Musculoskeletal: 5/5 strength to bilateral upper and lower extremities.  Steady gait. Neurologic:  No gait instability.  Normal sensation bilaterally upper and lower extremities. Mild  slurred speech noted.  Negative pronator drift.  No ataxia. Skin:  Skin is warm, dry and intact. No rash noted. Psychiatric: Mood and affect are normal. Speech and behavior are normal. Patient exhibits appropriate insight and judgment   ___________________________________________   LABS (all labs ordered are listed, but only abnormal results are displayed)  Labs Reviewed  GLUCOSE, CAPILLARY  CBG MONITORING, ED   ____________________________________________  EKG  ED ECG REPORT I, Marylene Land, the attending provider, personally viewed and interpreted this ECG.   Date: 12/22/2017  EKG Time: 1833  Rate: 65  Rhythm: normal sinus rhythm  Axis: normal  Intervals:none  unchanged from previous 09/19/17   RADIOLOGY  No results found. ____________________________________________  PROCEDURES Procedures    INITIAL IMPRESSION / ASSESSMENT AND PLAN / ED COURSE  Pertinent labs & imaging results that were available during my care of the patient were reviewed by me and considered in my medical decision making (see chart for details).  Well-appearing patient.  Patient with acute onset neurological changes for the last approximate 30 to 40 minutes.  No paresthesias noted on exam with good strength.  Mild slurred speech noted.  Patient with recent TIA currently on Plavix.  Concern for current acute neurological event and recommend further evaluation in ER at this time.  Recommend EMS transfer.  Patient alert and oriented with decisional capacity and declines recommended EMS transport.  Patient husband arrived at bedside and states that she wants her husband to take her to the ER and verbalize risks of private transfer.  Theadora Rama RN triage nurse [facility] called and given report.  Patient verbalized understanding and agreed with this plan. __________________________________________   FINAL CLINICAL IMPRESSION(S) / ED DIAGNOSES  Final diagnoses:  Slurred speech  Numbness      ED Discharge Orders    None       Note: This dictation was prepared with Dragon dictation along with smaller phrase technology. Any transcriptional errors that result from this process are unintentional.         Marylene Land, NP 12/22/17 1910

## 2017-12-22 NOTE — ED Triage Notes (Signed)
PT from UC with L leg numbness aprox 1800 with slurred speech, pt state symptoms have started to resolve. PT hx of TIA , VSS

## 2017-12-22 NOTE — Discharge Instructions (Signed)
Go directly to emergency room.  

## 2017-12-23 ENCOUNTER — Observation Stay: Admit: 2017-12-23 | Payer: 59

## 2017-12-23 ENCOUNTER — Observation Stay: Payer: 59

## 2017-12-23 ENCOUNTER — Other Ambulatory Visit: Payer: Self-pay

## 2017-12-23 DIAGNOSIS — G459 Transient cerebral ischemic attack, unspecified: Secondary | ICD-10-CM | POA: Diagnosis not present

## 2017-12-23 DIAGNOSIS — I10 Essential (primary) hypertension: Secondary | ICD-10-CM | POA: Diagnosis not present

## 2017-12-23 DIAGNOSIS — R4781 Slurred speech: Secondary | ICD-10-CM | POA: Diagnosis not present

## 2017-12-23 DIAGNOSIS — I639 Cerebral infarction, unspecified: Secondary | ICD-10-CM | POA: Diagnosis not present

## 2017-12-23 LAB — HEMOGLOBIN A1C
HEMOGLOBIN A1C: 5.6 % (ref 4.8–5.6)
MEAN PLASMA GLUCOSE: 114.02 mg/dL

## 2017-12-23 LAB — LIPID PANEL
Cholesterol: 182 mg/dL (ref 0–200)
HDL: 80 mg/dL (ref >40)
LDL Cholesterol: 93 mg/dL (ref 0–99)
Total CHOL/HDL Ratio: 2.3 RATIO
Triglycerides: 45 mg/dL (ref <150)
VLDL: 9 mg/dL (ref 0–40)

## 2017-12-23 NOTE — Evaluation (Signed)
Occupational Therapy Evaluation Patient Details Name: Alyssa Matthews MRN: [id number] DOB: 1960/03/24 Today's Date: 12/23/2017    History of Present Illness Alyssa Matthews  is a 89 y. female with a known history of hypertension, hyperlipidemia, hypothyroidism, PFO, TIA and CVA. Patient presents with slurred speech and left sided weakness since 6 PM yesterday   Clinical Impression   Pt at OT eval return to prior level of function - no visual deficits, Normal AROM and strength in bilateral UE - was up[ and about to bathroom several times during day with no LOB per pt -and showed no LOB during eval - in hand coordination and fingers <> palm WNL - pt return to prior level of function - no OT services indicated at this time                 Follow Up Recommendations    none   Equipment Recommendations    none   Recommendations for Other Services       Precautions / Restrictions        Mobility Bed Mobility Overal bed mobility: Independent                Transfers                      Balance Overall balance assessment: Independent(No LOB)                                         ADL either performed or assessed with clinical judgement   ADL Overall ADL's : Independent                                             Vision Baseline Vision/History: No visual deficits       Perception     Praxis      Pertinent Vitals/Pain Pain Assessment: No/denies pain     Hand Dominance     Extremity/Trunk Assessment             Communication     Cognition Arousal/Alertness: Awake/alert Behavior During Therapy: WFL for tasks assessed/performed                                       General Comments       Exercises Other Exercises Other Exercises: In hand coordination - fingers <> thumb WNL - open and closing tops of small bottle Other Exercises: Visual field and quadrants WNL - no deficits  Other  Exercises: Bilateral UE WNL AROM and strength  Other Exercises: Denies stress at work - 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    Shoulder Instructions      Home Living Family/patient expects to be discharged to:: Private residence Living Arrangements: Spouse/significant other   Type of Home: Apartment Home Access: Stairs to enter                     Home Equipment: None          Prior Functioning/Environment Level of Independence: Independent  OT Problem List:        OT Treatment/Interventions:      OT Goals(Current goals can be found in the care plan section) Acute Rehab OT Goals Patient Stated Goal: To go home - will check with cardiologist - " hole in my heart"   OT Goal Formulation: With patient Time For Goal Achievement: 12/23/17 Potential to Achieve Goals: Good  OT Frequency:     Barriers to D/C:            Co-evaluation              AM-PAC PT "6 Clicks" Daily Activity     Outcome Measure                 End of Session    Activity Tolerance: Patient tolerated treatment well Patient left: in bed;with family/visitor present                   Time: 1000-1015 OT Time Calculation (min): 15 min Charges:  OT General Charges $OT Visit: 1 Visit   Rosalyn Gess OTR/L,CLT 12/23/2017, 11:03 AM

## 2017-12-23 NOTE — Progress Notes (Signed)
Pt D/C to home with husband. Education completed. All questions answered. IV removed intact. VSS.

## 2017-12-23 NOTE — Plan of Care (Signed)
  Problem: Education: Goal: Knowledge of disease or condition will improve Outcome: Progressing Goal: Knowledge of secondary prevention will improve Outcome: Progressing Goal: Knowledge of patient specific risk factors addressed and post discharge goals established will improve Outcome: Progressing Goal: Individualized Educational Video(s) Outcome: Progressing   Problem: Coping: Goal: Will verbalize positive feelings about self Outcome: Progressing Goal: Will identify appropriate support needs Outcome: Progressing   Problem: Health Behavior/Discharge Planning: Goal: Ability to manage health-related needs will improve Outcome: Progressing   Problem: Self-Care: Goal: Ability to participate in self-care as condition permits will improve Outcome: Progressing Goal: Verbalization of feelings and concerns over difficulty with self-care will improve Outcome: Progressing Goal: Ability to communicate needs accurately will improve Outcome: Progressing   Problem: Nutrition: Goal: Risk of aspiration will decrease Outcome: Progressing   Problem: Ischemic Stroke/TIA Tissue Perfusion: Goal: Complications of ischemic stroke/TIA will be minimized Outcome: Progressing   Problem: Education: Goal: Knowledge of General Education information will improve Description Including pain rating scale, medication(s)/side effects and non-pharmacologic comfort measures Outcome: Progressing   Problem: Health Behavior/Discharge Planning: Goal: Ability to manage health-related needs will improve Outcome: Progressing   Problem: Clinical Measurements: Goal: Ability to maintain clinical measurements within normal limits will improve Outcome: Progressing Goal: Will remain free from infection Outcome: Progressing Goal: Diagnostic test results will improve Outcome: Progressing Goal: Respiratory complications will improve Outcome: Progressing Goal: Cardiovascular complication will be avoided Outcome:  Progressing   Problem: Activity: Goal: Risk for activity intolerance will decrease Outcome: Progressing   Problem: Nutrition: Goal: Adequate nutrition will be maintained Outcome: Progressing   Problem: Coping: Goal: Level of anxiety will decrease Outcome: Progressing   Problem: Elimination: Goal: Will not experience complications related to bowel motility Outcome: Progressing Goal: Will not experience complications related to urinary retention Outcome: Progressing   Problem: Pain Managment: Goal: General experience of comfort will improve Outcome: Progressing   Problem: Safety: Goal: Ability to remain free from injury will improve Outcome: Progressing   Problem: Skin Integrity: Goal: Risk for impaired skin integrity will decrease Outcome: Progressing   

## 2017-12-23 NOTE — Discharge Instructions (Signed)
Advised to call and make appointment with Dr. Laverle Patter shows on Monday for follow-up on abnormal echo that was done in June 2019

## 2017-12-23 NOTE — Evaluation (Signed)
Physical Therapy Evaluation Patient Details Name: Alyssa Matthews MRN: [id number] DOB: 08/06/1960 Today's Date: 12/23/2017   History of Present Illness  Alyssa Matthews  is a 89 y. female with a known history of hypertension, hyperlipidemia, hypothyroidism, PFO, TIA and CVA. Patient presents with slurred speech and left sided weakness since 6 PM yesterday  Clinical Impression  Pt was seen for evaluation and noted her efforts were good, and is appropriate for follow up with her own access to gym.  Employer does not have a gym but agreed to ck with the [employer], and will ask PCP if needed for PT to see her.  Does not present a fall hazard but given her cardiac history may need more assistance with this later.    Follow Up Recommendations No PT follow up    Equipment Recommendations  None recommended by PT    Recommendations for Other Services Other (comment)(gym memership to increase strength to legs)     Precautions / Restrictions Precautions Precautions: Fall(prior to PT evaluation) Restrictions Weight Bearing Restrictions: No      Mobility  Bed Mobility Overal bed mobility: Independent                Transfers Overall transfer level: Independent Equipment used: None                Ambulation/Gait Ambulation/Gait assistance: Forensic psychologist) Gait Distance (Feet): 300 Feet Assistive device: None Gait Pattern/deviations: Step-through pattern;Decreased stride length;Narrow base of support     General Gait Details: careful but not unsafe and not losing her balance  Stairs Stairs: Yes Stairs assistance: Min guard Stair Management: One rail Left;Alternating pattern;Forwards Number of Stairs: 10 General stair comments: pt was able to navigate right after walking and no signs of LOB were evident  Wheelchair Mobility    Modified Rankin (Stroke Patients Only)       Balance Overall balance assessment: Modified Independent                                            Pertinent Vitals/Pain Pain Assessment: No/denies pain    Home Living Family/patient expects to be discharged to:: Private residence Living Arrangements: Spouse/significant other Available Help at Discharge: Family;Available PRN/intermittently Type of Home: Apartment Home Access: Stairs to enter Entrance Stairs-Rails: Right Entrance Stairs-Number of Steps: 13 Home Layout: One level Home Equipment: None Additional Comments: 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 Denies falls in past 12 months.      Hand Dominance        Extremity/Trunk Assessment   Upper Extremity Assessment Upper Extremity Assessment: Overall WFL for tasks assessed    Lower Extremity Assessment Lower Extremity Assessment: (minor B weakness)    Cervical / Trunk Assessment Cervical / Trunk Assessment: Normal  Communication   Communication: No difficulties  Cognition Arousal/Alertness: Awake/alert Behavior During Therapy: WFL for tasks assessed/performed Overall Cognitive Status: Within Functional Limits for tasks assessed  General Comments      Exercises Other Exercises Other Exercises: In hand coordination - fingers <> thumb WNL - open and closing tops of small bottle Other Exercises: Visual field and quadrants WNL - no deficits  Other Exercises: Bilateral UE WNL AROM and strength  Other Exercises: Denies stress at work - 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    Assessment/Plan    PT Assessment Patient needs continued PT services(will see for LE strength until DC)  PT Problem List Decreased strength;Decreased activity  tolerance;Cardiopulmonary status limiting activity;Other (comment)(concerns by cardiologist about PFO)       PT Treatment Interventions Gait training;Stair training;Functional mobility training;Therapeutic activities;Therapeutic exercise;Balance training;Neuromuscular re-education;Patient/family education    PT Goals (Current goals can be found in the Care Plan section)  Acute Rehab PT Goals Patient Stated Goal: To go home - will check with cardiologist - " hole in my heart"   PT Goal Formulation: With patient Time For Goal Achievement: 01/06/18 Potential to Achieve Goals: Good    Frequency Min 2X/week   Barriers to discharge Decreased caregiver support home with I at times    Co-evaluation               AM-PAC PT "6 Clicks" Daily Activity  Outcome Measure Difficulty turning over in bed (including adjusting bedclothes, sheets and blankets)?: None Difficulty moving from lying on back to sitting on the side of the bed? : None Difficulty sitting down on and standing up from a chair with arms (e.g., wheelchair, bedside commode, etc,.)?: None Help needed moving to and from a bed to chair (including a wheelchair)?: None Help needed walking in hospital room?: A Little Help needed climbing 3-5 steps with a railing? : A Little 6 Click Score: 22    End of Session Equipment Utilized During Treatment: Gait belt Activity Tolerance: Patient tolerated treatment well;Patient limited by fatigue Patient left: in bed;with call bell/phone within reach Nurse Communication: Mobility status PT Visit Diagnosis: Muscle weakness (generalized) (M62.81)    Time: 8786-7672 PT Time Calculation (min) (ACUTE ONLY): 17 min   Charges:   PT Evaluation $PT Eval Low Complexity: 1 Low         Ramond Dial 12/23/2017, 12:41 PM  Mee Hives, PT MS Acute Rehab Dept. Number: [place]

## 2017-12-23 NOTE — Discharge Summary (Signed)
[facility] NAME: Alyssa Matthews    MR#:  [id number]  DATE OF BIRTH:  December 05, 1960  DATE OF ADMISSION:  12/22/2017 ADMITTING PHYSICIAN: Demetrios Loll, MD  DATE OF DISCHARGE: 12/23/2017  PRIMARY CARE PHYSICIAN: Leone Haven, MD    ADMISSION DIAGNOSIS:  Thyroid nodule [E04.1] Slurred speech [R47.81] Left leg numbness [R20.0] Left leg weakness [R29.898]  DISCHARGE DIAGNOSIS:  recurrent TIA patent foramen ovale on echo in June 2019--- follow-up cardiology as outpatient  SECONDARY DIAGNOSIS:   Past Medical History:  Diagnosis Date  . History of blood transfusion   . Hypercholesteremia   . Hypertension   . Hyperthyroidism    a. s/p RAI  . Lacunar infarction ([place])    a. MRI brain 6/19: remote lacunar infarcts involving the pons  . PFO (patent foramen ovale)    a. TTE 6/19: EF 60-65%, no RWMA, Gr1DD, mild MR, RVSF normal, patent foramen ovale was noted with a positive saline contrast study, PASP normal  . TIA (transient ischemic attack) 08/2017  . Wears dentures    partial upper    HOSPITAL COURSE:  Alyssa Matthews  is a 38 y. female with a known history of hypertension, hyperlipidemia, hypothyroidism, PFO, TIA and CVA. Patient presents with slurred speech and left sided weakness since 6 PM yesterday.  1. recurrent TIA. -Continue Plavix -did not tolerated aspirin in the past -has seen neurology as outpatient in June 2019. Dr. Melrose Nakayama started patient on Plavix. -Echo in June showed patent foramen ovale. Patient was supposed to see cardiology as outpatient. -Discussed with cardiology Dr. Saralyn Pilar. Recommends outpatient follow-up. This was discussed with patient and she will make appointment Monday to follow-up cardiology as outpatient -left lower extremity weakness resolved -ultrasound carotid Doppler done in June showed some atherosclerotic plaque. Will not repeat it -MRI brain negative this time  2.Hypertension.   Continue home hypertension medication.  3.Hyperlipidemia.  on crestor  4. History of CVA.  Continue Plavix  Overall feels at baseline. Discharged home.  CONSULTS OBTAINED:    DRUG ALLERGIES:  No Known Allergies  DISCHARGE MEDICATIONS:   Allergies as of 12/23/2017   No Known Allergies     Medication List    TAKE these medications   amLODipine 10 MG tablet Commonly known as:  NORVASC TAKE 1 TABLET (10 MG TOTAL) BY MOUTH DAILY.   clopidogrel 75 MG tablet Commonly known as:  PLAVIX Take 1 tablet (75 mg total) by mouth daily.   multivitamin with minerals Tabs tablet Take 1 tablet by mouth daily.   rosuvastatin 5 MG tablet Commonly known as:  CRESTOR Take 1 tablet (5 mg total) by mouth daily.   Vitamin D (Ergocalciferol) 50000 units Caps capsule Commonly known as:  DRISDOL Take 1 capsule (50,000 Units total) by mouth every 7 (seven) days.       If you experience worsening of your admission symptoms, develop shortness of breath, life threatening emergency, suicidal or homicidal thoughts you must seek medical attention immediately by calling 911 or calling your MD immediately  if symptoms less severe.  You Must read complete instructions/literature along with all the possible adverse reactions/side effects for all the Medicines you take and that have been prescribed to you. Take any new Medicines after you have completely understood and accept all the possible adverse reactions/side effects.   Please note  You were cared for by a hospitalist during your hospital stay. If you have any questions about your discharge medications or  the care you received while you were in the hospital after you are discharged, you can call the unit and asked to speak with the hospitalist on call if the hospitalist that took care of you is not available. Once you are discharged, your primary care physician will handle any further medical issues. Please note that NO REFILLS for any discharge  medications will be authorized once you are discharged, as it is imperative that you return to your primary care physician (or establish a relationship with a primary care physician if you do not have one) for your aftercare needs so that they can reassess your need for medications and monitor your lab values. Today   SUBJECTIVE   Doing well. Left lower extremity weakness resolved. Family in the room.  VITAL SIGNS:  Blood pressure 118/73, pulse 65, temperature 98.4 F (36.9 C), temperature source Oral, resp. rate 18, height 5\' 4"  (1.626 m), weight 70.1 kg, SpO2 99 %.  I/O:  No intake or output data in the 24 hours ending 12/23/17 1032  PHYSICAL EXAMINATION:  GENERAL:  57 y.o.-year-old patient lying in the bed with no acute distress.  EYES: Pupils equal, round, reactive to light and accommodation. No scleral icterus. Extraocular muscles intact.  HEENT: Head atraumatic, normocephalic. Oropharynx and nasopharynx clear.  NECK:  Supple, no jugular venous distention. No thyroid enlargement, no tenderness.  LUNGS: Normal breath sounds bilaterally, no wheezing, rales,rhonchi or crepitation. No use of accessory muscles of respiration.  CARDIOVASCULAR: S1, S2 normal. No murmurs, rubs, or gallops.  ABDOMEN: Soft, non-tender, non-distended. Bowel sounds present. No organomegaly or mass.  EXTREMITIES: No pedal edema, cyanosis, or clubbing.  NEUROLOGIC: Cranial nerves II through XII are intact. Muscle strength 5/5 in all extremities. Sensation intact. Gait not checked.  PSYCHIATRIC: The patient is alert and oriented x 3.  SKIN: No obvious rash, lesion, or ulcer.   DATA REVIEW:   CBC  Recent Labs  Lab 12/22/17 1914  WBC 5.6  HGB 12.3  HCT 37.5  PLT 290    Chemistries  Recent Labs  Lab 12/22/17 1914  NA 140  K 4.0  CL 106  CO2 28  GLUCOSE 110*  BUN 17  CREATININE 0.62  CALCIUM 9.4  AST 22  ALT 21  ALKPHOS 82  BILITOT 0.4    Microbiology Results   No results found for this  or any previous visit (from the past 240 hour(s)).  RADIOLOGY:  Ct Angio Head W Or Wo Contrast  Result Date: 12/22/2017 CLINICAL DATA:  LEFT-sided numbness and weakness in the leg. Dizziness. History of TIA. EXAM: CT ANGIOGRAPHY HEAD AND NECK TECHNIQUE: Multidetector CT imaging of the head and neck was performed using the standard protocol during bolus administration of intravenous contrast. Multiplanar CT image reconstructions and MIPs were obtained to evaluate the vascular anatomy. Carotid stenosis measurements (when applicable) are obtained utilizing NASCET criteria, using the distal internal carotid diameter as the denominator. CONTRAST:  82mL OMNIPAQUE IOHEXOL 350 MG/ML SOLN COMPARISON:  Code stroke CT earlier in the day.  MR head 08/31/2017. FINDINGS: CTA NECK FINDINGS Aortic arch: Standard branching. Imaged portion shows no evidence of aneurysm or dissection. No significant stenosis of the major arch vessel origins. Aortic atherosclerosis. Right carotid system: No evidence of dissection, stenosis (50% or greater) or occlusion. Minor calcific atheromatous change. Left carotid system: No evidence of dissection, stenosis (50% or greater) or occlusion. Minor calcific atheromatous change. Vertebral arteries: Codominant. No evidence of dissection, stenosis (50% or greater) or occlusion. Skeleton: Spondylosis. Reversal  of normal cervical lordotic curve. C4 sclerotic lesion, likely hemangioma. Other neck: Large LEFT thyroid lesion, extending to the isthmus, heterogeneous attenuation, 27 x 15 x 23 mm, not clearly calcified. Upper chest: No mass or pneumothorax. Review of the MIP images confirms the above findings CTA HEAD FINDINGS Anterior circulation: No significant stenosis, proximal occlusion, aneurysm, or vascular malformation. Minor calcific change in the cavernous carotids bilaterally. Posterior circulation: No significant stenosis, proximal occlusion, aneurysm, or vascular malformation. Venous sinuses: As  permitted by contrast timing, patent. Anatomic variants: None of significance. Delayed phase: No abnormal intracranial enhancement. Review of the MIP images confirms the above findings IMPRESSION: Minor intracranial and extracranial atherosclerotic change, but no large vessel occlusion. Basilar artery widely patent. No abnormal postcontrast enhancement. 27 x 15 x 23 mm heterogeneous LEFT thyroid nodule extending to the isthmus. Consider further evaluation with thyroid ultrasound. If patient is clinically hyperthyroid, consider nuclear medicine thyroid uptake and scan. Electronically Signed   By: Staci Righter M.D.   On: 12/22/2017 20:18   Ct Angio Neck W And/or Wo Contrast  Result Date: 12/22/2017 CLINICAL DATA:  LEFT-sided numbness and weakness in the leg. Dizziness. History of TIA. EXAM: CT ANGIOGRAPHY HEAD AND NECK TECHNIQUE: Multidetector CT imaging of the head and neck was performed using the standard protocol during bolus administration of intravenous contrast. Multiplanar CT image reconstructions and MIPs were obtained to evaluate the vascular anatomy. Carotid stenosis measurements (when applicable) are obtained utilizing NASCET criteria, using the distal internal carotid diameter as the denominator. CONTRAST:  11mL OMNIPAQUE IOHEXOL 350 MG/ML SOLN COMPARISON:  Code stroke CT earlier in the day.  MR head 08/31/2017. FINDINGS: CTA NECK FINDINGS Aortic arch: Standard branching. Imaged portion shows no evidence of aneurysm or dissection. No significant stenosis of the major arch vessel origins. Aortic atherosclerosis. Right carotid system: No evidence of dissection, stenosis (50% or greater) or occlusion. Minor calcific atheromatous change. Left carotid system: No evidence of dissection, stenosis (50% or greater) or occlusion. Minor calcific atheromatous change. Vertebral arteries: Codominant. No evidence of dissection, stenosis (50% or greater) or occlusion. Skeleton: Spondylosis. Reversal of normal  cervical lordotic curve. C4 sclerotic lesion, likely hemangioma. Other neck: Large LEFT thyroid lesion, extending to the isthmus, heterogeneous attenuation, 27 x 15 x 23 mm, not clearly calcified. Upper chest: No mass or pneumothorax. Review of the MIP images confirms the above findings CTA HEAD FINDINGS Anterior circulation: No significant stenosis, proximal occlusion, aneurysm, or vascular malformation. Minor calcific change in the cavernous carotids bilaterally. Posterior circulation: No significant stenosis, proximal occlusion, aneurysm, or vascular malformation. Venous sinuses: As permitted by contrast timing, patent. Anatomic variants: None of significance. Delayed phase: No abnormal intracranial enhancement. Review of the MIP images confirms the above findings IMPRESSION: Minor intracranial and extracranial atherosclerotic change, but no large vessel occlusion. Basilar artery widely patent. No abnormal postcontrast enhancement. 27 x 15 x 23 mm heterogeneous LEFT thyroid nodule extending to the isthmus. Consider further evaluation with thyroid ultrasound. If patient is clinically hyperthyroid, consider nuclear medicine thyroid uptake and scan. Electronically Signed   By: Staci Righter M.D.   On: 12/22/2017 20:18   Mr Brain Wo Contrast  Result Date: 12/23/2017 CLINICAL DATA:  Slurred speech and left lower extremity numbness. EXAM: MRI HEAD WITHOUT CONTRAST TECHNIQUE: Multiplanar, multiecho pulse sequences of the brain and surrounding structures were obtained without intravenous contrast. COMPARISON:  CTA head neck 12/22/2017 FINDINGS: BRAIN: There is no acute infarct, acute hemorrhage, hydrocephalus or extra-axial collection. The midline structures are normal. No midline  shift or other mass effect. There are no old infarcts. Multifocal white matter hyperintensity, most commonly due to chronic ischemic microangiopathy. The cerebral and cerebellar volume are age-appropriate. Susceptibility-sensitive  sequences show no chronic microhemorrhage or superficial siderosis. VASCULAR: Major intracranial arterial and venous sinus flow voids are normal. SKULL AND UPPER CERVICAL SPINE: Calvarial bone marrow signal is normal. There is no skull base mass. Visualized upper cervical spine and soft tissues are normal. SINUSES/ORBITS: No fluid levels or advanced mucosal thickening. No mastoid or middle ear effusion. The orbits are normal. IMPRESSION: Mild chronic small vessel ischemia without acute intracranial abnormality. Electronically Signed   By: Ulyses Jarred M.D.   On: 12/23/2017 01:15   Ct Head Code Stroke Wo Contrast  Result Date: 12/22/2017 CLINICAL DATA:  Code stroke. Left-sided numbness. Weakness in left lower extremity. Abnormal speech. Symptoms began 1 hour ago. EXAM: CT HEAD WITHOUT CONTRAST TECHNIQUE: Contiguous axial images were obtained from the base of the skull through the vertex without intravenous contrast. COMPARISON:  None. FINDINGS: Brain: No acute infarct, hemorrhage, or mass lesion is present. Basal ganglia are within normal limits. Insular cortex is normal. No significant white matter disease is present. Remote central pontine infarct is stable. Cerebellum is within normal limits. Vascular: The basilar artery is hyperdense compared to the anterior circulation. Skull: Calvarium is intact. No focal lytic or blastic lesions are present. Sinuses/Orbits: The paranasal sinuses and mastoid air cells are clear. ASPECTS [facility] Stroke Program Early CT Score) - Ganglionic level infarction (caudate, lentiform nuclei, internal capsule, insula, M1-M3 cortex): 7/7 - Supraganglionic infarction (M4-M6 cortex): 3/3 Total score (0-10 with 10 being normal): 10/10 IMPRESSION: 1. Hyperdense basilar artery concerning for slow or occluded flow. Recommend CTA head and neck for further evaluation. 2. Remote central pontine infarct. 3. ASPECTS is 10/10 These results were called by telephone at the time of interpretation  on 12/22/2017 at 7:20 pm to Dr. Carrie Mew , who verbally acknowledged these results. Electronically Signed   By: San Morelle M.D.   On: 12/22/2017 19:22     Management plans discussed with the patient, family and they are in agreement.  CODE STATUS:     Code Status Orders  (From admission, onward)         Start     Ordered   12/22/17 2155  Full code  Continuous     12/22/17 2154        Code Status History    Date Active Date Inactive Code Status Order ID Comments User Context   08/31/2017 0024 08/31/2017 2153 Full Code [id number]  Amelia Jo, MD Inpatient      TOTAL TIME TAKING CARE OF THIS PATIENT: *40* minutes.    Fritzi Mandes M.D on 12/23/2017 at 10:32 AM  Between 7am to 6pm - Pager - [phone] After 6pm go to [email or web address] - password [email or web address] [facility]  Office  [phone]  CC: Primary care physician; Leone Haven, MD

## 2017-12-25 ENCOUNTER — Telehealth: Payer: Self-pay | Admitting: Family Medicine

## 2017-12-25 NOTE — Telephone Encounter (Signed)
Transition Care Management Follow-up Telephone Call  How have you been since you were released from the hospital? Patient stated she is feeling bettersays left side still weaker than right.   Do you understand why you were in the hospital? yes   Do you understand the discharge instrcutions? yes  Items Reviewed:  Medications reviewed: yes  Allergies reviewed: yes  Dietary changes reviewed: yes  Referrals reviewed: yes   Functional Questionnaire:   Activities of Daily Living (ADLs):   She states they are independent in the following: ambulation, bathing and hygiene, feeding, continence, grooming, toileting and dressing States they require assistance with the following: no assistance needed at thistime.   Any transportation issues/concerns?: no   Any patient concerns? Yes, patient has question concerning FMLA paperwork she needs completed for work in case something like this happens again.   Confirmed importance and date/time of follow-up visits scheduled: yes   Confirmed with patient if condition begins to worsen call PCP or go to the ER.  Patient was given the Call-a-Nurse line [phone]: yes

## 2018-01-01 ENCOUNTER — Telehealth: Payer: Self-pay

## 2018-01-01 ENCOUNTER — Ambulatory Visit (INDEPENDENT_AMBULATORY_CARE_PROVIDER_SITE_OTHER): Payer: 59 | Admitting: Family Medicine

## 2018-01-01 ENCOUNTER — Encounter: Payer: Self-pay | Admitting: Family Medicine

## 2018-01-01 DIAGNOSIS — Q211 Atrial septal defect: Secondary | ICD-10-CM | POA: Diagnosis not present

## 2018-01-01 DIAGNOSIS — E785 Hyperlipidemia, unspecified: Secondary | ICD-10-CM

## 2018-01-01 DIAGNOSIS — G459 Transient cerebral ischemic attack, unspecified: Secondary | ICD-10-CM | POA: Diagnosis not present

## 2018-01-01 DIAGNOSIS — M25552 Pain in left hip: Secondary | ICD-10-CM | POA: Insufficient documentation

## 2018-01-01 DIAGNOSIS — I10 Essential (primary) hypertension: Secondary | ICD-10-CM

## 2018-01-01 DIAGNOSIS — E7800 Pure hypercholesterolemia, unspecified: Secondary | ICD-10-CM

## 2018-01-01 DIAGNOSIS — Q2112 Patent foramen ovale: Secondary | ICD-10-CM

## 2018-01-01 HISTORY — DX: Pain in left hip: M25.552

## 2018-01-01 MED ORDER — ROSUVASTATIN CALCIUM 20 MG PO TABS: 20.0000 mg | ORAL_TABLET | Freq: Every day | ORAL | 1 refills | Status: DC

## 2018-01-01 NOTE — Assessment & Plan Note (Signed)
Overall doing well.  Neurologically intact.  She will continue Plavix.  We will increase her Crestor dose to try to get her cholesterol at goal.  She will return for labs in 1 month.  Given return precautions.

## 2018-01-01 NOTE — Progress Notes (Signed)
Tommi Rumps, MD Phone: [phone]  Alyssa Matthews is a 57 y.o. female who presents today for f/u.  CC: htn, tia, hld, hip pain  HYPERTENSION  Disease Monitoring  Home BP Monitoring not checking Chest pain- no    Dyspnea- no Medications  Compliance-  Taking amlodipine.  Edema- no  HYPERLIPIDEMIA Symptoms Chest pain on exertion:  no   Medications: Compliance- taking crestor Right upper quadrant pain- no  Muscle aches- no  Left hip pain: Patient notes this occurred yesterday after she had been picking up a number of things.  She notes it felt like it was muscular.  She felt her tight jeans contributed.  This has resolved.  TIA: Patient recently hospitalized for TIA.  She developed left leg weakness and slurred speech.  She reports mild subjective left leg weakness that has been persistent.  Otherwise symptoms have resolved.  No numbness.  She has been on Plavix.  MRI brain did not reveal a stroke.  It was determined that she needed to follow-up with cardiology to discuss her PFO which potentially could be contributing.  Cholesterol was not at goal.    Social History   Tobacco Use  Smoking Status Former Smoker  . Packs/day: 0.75  . Years: 30.00  . Pack years: 22.50  . Types: Cigarettes  . Last attempt to quit: 08/2017  . Years since quitting: 0.3  Smokeless Tobacco Never Used     ROS see history of present illness  Objective  Physical Exam Vitals:   01/01/18 1103  BP: 118/72  Pulse: 76  Temp: 98.3 F (36.8 C)  SpO2: 98%    BP Readings from Last 3 Encounters:  01/01/18 118/72  12/23/17 118/73  12/22/17 124/79   Wt Readings from Last 3 Encounters:  01/01/18 157 lb 6.4 oz (71.4 kg)  12/22/17 154 lb 9.6 oz (70.1 kg)  12/22/17 152 lb (68.9 kg)    Physical Exam  Constitutional: No distress.  Cardiovascular: Normal rate, regular rhythm and normal heart sounds.  Pulmonary/Chest: Effort normal and breath sounds normal.  Musculoskeletal: She exhibits no  edema.  No muscular hip tenderness  Neurological: She is alert.  CN 2-12 intact, 5/5 strength in bilateral biceps, triceps, grip, quads, hamstrings, plantar and dorsiflexion, sensation to light touch intact in bilateral UE and LE, normal gait  Skin: Skin is warm and dry. She is not diaphoretic.     Assessment/Plan: Please see individual problem list.  Essential hypertension Well-controlled.  Continue current medication.  PFO (patent foramen ovale) Patient will keep her appointment with cardiology.  TIA (transient ischemic attack) Overall doing well.  Neurologically intact.  She will continue Plavix.  We will increase her Crestor dose to try to get her cholesterol at goal.  She will return for labs in 1 month.  Given return precautions.  Elevated LDL cholesterol level Increasing Crestor.  Return for labs in 1 month.  Left hip pain Suspect muscular irritation.  This has resolved.  She will monitor for recurrence.     Orders Placed This Encounter  Procedures  . LDL cholesterol, direct    Standing Status:   Future    Standing Expiration Date:   01/02/2019  . Hepatic function panel    Standing Status:   Future    Standing Expiration Date:   01/02/2019    Meds ordered this encounter  Medications  . rosuvastatin (CRESTOR) 20 MG tablet    Sig: Take 1 tablet (20 mg total) by mouth daily.    Dispense:  90 tablet    Refill:  [place], MD [place]

## 2018-01-01 NOTE — Assessment & Plan Note (Signed)
Well controlled. Continue current medication.  

## 2018-01-01 NOTE — Assessment & Plan Note (Signed)
Increasing Crestor.  Return for labs in 1 month.

## 2018-01-01 NOTE — Patient Instructions (Signed)
Nice to see you. We are going to increase your Crestor dose to 20 mg daily.  If you have any side effects from this please let us know.  We will check lab work again in 1 month. Please see cardiology as planned. If you have return of your left hip or flank discomfort please let us know.

## 2018-01-01 NOTE — Assessment & Plan Note (Signed)
Suspect muscular irritation.  This has resolved.  She will monitor for recurrence.

## 2018-01-01 NOTE — Assessment & Plan Note (Signed)
Patient will keep her appointment with cardiology.

## 2018-01-05 NOTE — Telephone Encounter (Signed)
error 

## 2018-01-09 ENCOUNTER — Telehealth: Payer: Self-pay | Admitting: Family Medicine

## 2018-01-09 NOTE — Telephone Encounter (Signed)
Pt dropped off FMLA paperwork. Papers are located in color folder.

## 2018-01-10 NOTE — Telephone Encounter (Signed)
Placed in Dr. Ellen Henri RED folder to be filled out and signed.

## 2018-01-11 NOTE — Telephone Encounter (Signed)
Forms to be faxed tomorrow.

## 2018-01-12 NOTE — Telephone Encounter (Signed)
Called and spoke with pt. Pt advised form's have been placed to be faxed copy kept to be scanne dinto pt's chart. Fax did go though on 01/12/2018.

## 2018-01-20 NOTE — Progress Notes (Signed)
Cardiology Office Note Date:  01/22/2018  Patient ID:  Alyssa Matthews, Alyssa Matthews 1960/11/17, MRN [id number] PCP:  Leone Haven, MD  Cardiologist:  Dr. Rockey Situ, MD    Chief Complaint: Hospital follow up  History of Present Illness: Alyssa Matthews is a 57 y.o. female with history of TIA in 08/2017 with possible patient reported TIA/CVA ~ 4 years prior with recent MRI brain showing prior lacunar infarcts, HTN, HLD, hyperthyroidism s/p RAI, ongoing tobacco abuse with prior cessation attempts being unsuccessful who presents for hospital follow up from recent admission for recurrent TIA from 10/11-10/12.   Patient was initially evaluated by Dr. Rockey Situ in 12/2016 for exertional SOB and chest pain. She was concerned about her symptoms given her family history with her mother having PAD s/p stenting and lower extremity arterial bypass (smoker), father with cancer s/p XRT/chemo (smoker), aunt with cancer s/p chemo (smoker). Calcium score was ordered, though never completed. Patient was admitted to the hospital on 08/30/2017 with left facial numbness and left lower extremity numbness that started suddenly ~ 2.5 hours prior to her presentation. No administration of tPA given resolution of symptoms. CT head was not acute. MRI brain showed remote lacunar infarcts involving the pons. Carotid ultrasound was negative for hemodynamically significant stenoses. LDL 108 (not on statin at admission), A1c 5.5. Echo with bubble study showed an EF of 60-65%, no RWMA, Gr1DD, mild MR, RVSF normal, patent foramen ovale was noted with a positive saline contrast study, PASP normal. She was seen by neurology and placed on ASA 325 mg daily. Inpatient neurology recommended conservative management of her PFO at that time given the remainder of her vascular risk factors were not adequately controlled at that time. She was seen by outpatient neurology on 09/06/2017 with recommendation to change ASA to Plavix. She was seen by cardiology  in hospital follow up in 09/2017 and was doing well from a cardiac perspective. She denied any residual deficits from her stroke. She was noted to have self-discontinued Lipitor secondary to nausea and lower extremity myalgias. The patient's clinical case was discussed/reviewed by our structural heart team in [place] given her history of lacunar infarct and noted PFO on echocardiogram as above. Structural heart team agreed with inpatient neurology that the PFO was small as noted on the bubble study and the patient had multiple uncontrolled risk factors for stroke. Continued conservative management was recommended by both neurology and the structural heart team. She was noted to not have smoked a cigarette since 09/01/17. She was admitted to [facility] on 10/11 with slurred speech and left leg numbness. CT head showed a hyperdense basilar artery concerning for slow or occluded flow as well as a remote central pontine infarct. CTA head and neck showed minor intracranial and extracranial atherosclerotic change, without large vessel occlusion with a widely patent basilar artery. There was an incidental left thyroid nodule noted extending to the isthmus. MRI brain showed mild chronic small vessel ischemia without acute intracranial abnormality. She was continued on Plavix, amlodipine, Crestor and her non-cardiac medications.  Labs: 12/2017 - LDL 93, A1c 5.6, TSH 0.716, troponin < 0.03 x 1, LFT normal, SCr 0.62, K+ 4.0, CBC unremarkable   Patient comes in doing well today.  She does not have any residual deficits from her most recent TIA.  She indicates she was in her usual state of health on 10/11 when she suddenly developed a headache followed by slurred speech and left lower extremity weakness/paresthesias.  Symptoms lasted for approximately 30 minutes and  spontaneously resolved.  She has been symptom-free since.  She has continued to abstain from tobacco dating back to 08/2017.  She reports compliance with Crestor,  Plavix, and amlodipine.  Blood pressure remains well controlled.  She has previously noted myalgias with atorvastatin though seems to be tolerating Crestor.  No chest pain, shortness of breath, palpitations, dizziness, presyncope, or syncope.  Case was discussed with the structural heart team with recommendation to formally consult on patient regarding PFO.   Past Medical History:  Diagnosis Date  . History of blood transfusion   . Hypercholesteremia   . Hypertension   . Hyperthyroidism    a. s/p RAI  . Lacunar infarction ([place])    a. MRI brain 6/19: remote lacunar infarcts involving the pons  . PFO (patent foramen ovale)    a. TTE 6/19: EF 60-65%, no RWMA, Gr1DD, mild MR, RVSF normal, patent foramen ovale was noted with a positive saline contrast study, PASP normal  . TIA (transient ischemic attack) 08/2017  . Wears dentures    partial upper    Past Surgical History:  Procedure Laterality Date  . BREAST CYST ASPIRATION  2015  . COLONOSCOPY WITH PROPOFOL N/A 03/30/2015   Procedure: COLONOSCOPY WITH PROPOFOL;  Surgeon: Lucilla Lame, MD;  Location: [place];  Service: Endoscopy;  Laterality: N/A;  . DILATION AND CURETTAGE OF UTERUS    . POLYPECTOMY  03/30/2015   Procedure: POLYPECTOMY;  Surgeon: Lucilla Lame, MD;  Location: [place];  Service: Endoscopy;;    Current Meds  Medication Sig  . amLODipine (NORVASC) 10 MG tablet TAKE 1 TABLET (10 MG TOTAL) BY MOUTH DAILY.  Marland Kitchen clopidogrel (PLAVIX) 75 MG tablet Take 1 tablet (75 mg total) by mouth daily.  . Multiple Vitamin (MULTIVITAMIN WITH MINERALS) TABS tablet Take 1 tablet by mouth daily.  . rosuvastatin (CRESTOR) 20 MG tablet Take 1 tablet (20 mg total) by mouth daily.  . Vitamin D, Ergocalciferol, (DRISDOL) 50000 units CAPS capsule Take 1 capsule (50,000 Units total) by mouth every 7 (seven) days.    Allergies:   Patient has no known allergies.   Social History:  The patient  reports that she quit smoking about 5  months ago. Her smoking use included cigarettes. She has a 22.50 pack-year smoking history. She has never used smokeless tobacco. She reports that she drank alcohol. She reports that she does not use drugs.   Family History:  The patient's family history includes Cancer in her father; Drug abuse in her paternal grandmother; Hypertension in her brother and mother; Kidney disease in her brother.  ROS:   Review of Systems  Constitutional: Negative for chills, diaphoresis, fever, malaise/fatigue and weight loss.  HENT: Negative for congestion.   Eyes: Negative for discharge and redness.  Respiratory: Negative for cough, hemoptysis, sputum production, shortness of breath and wheezing.   Cardiovascular: Negative for chest pain, palpitations, orthopnea, claudication, leg swelling and PND.  Gastrointestinal: Negative for abdominal pain, blood in stool, heartburn, melena, nausea and vomiting.  Genitourinary: Negative for hematuria.  Musculoskeletal: Negative for falls and myalgias.  Skin: Negative for rash.  Neurological: Negative for dizziness, tingling, tremors, sensory change, speech change, focal weakness, loss of consciousness and weakness.  Endo/Heme/Allergies: Does not bruise/bleed easily.  Psychiatric/Behavioral: Negative for substance abuse. The patient is not nervous/anxious.   All other systems reviewed and are negative.    PHYSICAL EXAM:  VS:  BP 122/70 (BP Location: Left Arm, Patient Position: Sitting, Cuff Size: Normal)   Pulse (!) 59  Ht 5\' 5"  (1.651 m)   Wt 160 lb (72.6 kg)   BMI 26.63 kg/m  BMI: Body mass index is 26.63 kg/m.  Physical Exam  Constitutional: She is oriented to person, place, and time. She appears well-developed and well-nourished.  HENT:  Head: Normocephalic and atraumatic.  Eyes: Right eye exhibits no discharge. Left eye exhibits no discharge.  Neck: Normal range of motion. No JVD present.  Cardiovascular: Normal rate, regular rhythm, S1 normal and S2  normal. Exam reveals no distant heart sounds, no friction rub, no midsystolic click and no opening snap.  Murmur heard. High-pitched blowing holosystolic murmur is present with a grade of 1/6 at the apex. Pulses:      Posterior tibial pulses are 2+ on the right side, and 2+ on the left side.  Pulmonary/Chest: Effort normal and breath sounds normal. No respiratory distress. She has no decreased breath sounds. She has no wheezes. She has no rales. She exhibits no tenderness.  Abdominal: Soft. She exhibits no distension. There is no tenderness.  Musculoskeletal: She exhibits no edema.  Neurological: She is alert and oriented to person, place, and time.  Skin: Skin is warm and dry. No cyanosis. Nails show no clubbing.  Psychiatric: She has a normal mood and affect. Her speech is normal and behavior is normal. Judgment and thought content normal.     EKG:  Was ordered and interpreted by me today. Shows sinus bradycardia, 59 bpm, T wave inversion leads V2 through V4 (unchanged)  Recent Labs: 12/22/2017: ALT 21; BUN 17; Creatinine, Ser 0.62; Hemoglobin 12.3; Platelets 290; Potassium 4.0; Sodium 140; TSH 0.716  10/16/2017: Direct LDL 67.0 12/23/2017: Cholesterol 182; HDL 80; LDL Cholesterol 93; Total CHOL/HDL Ratio 2.3; Triglycerides 45; VLDL 9   CrCl cannot be calculated (Patient's most recent lab result is older than the maximum 21 days allowed.).   Wt Readings from Last 3 Encounters:  01/22/18 160 lb (72.6 kg)  01/01/18 157 lb 6.4 oz (71.4 kg)  12/22/17 154 lb 9.6 oz (70.1 kg)     Other studies reviewed: Additional studies/records reviewed today include: summarized above  ASSESSMENT AND PLAN:  1. PFO: We will refer to structural heart team for formal evaluation of possible PFO closure given recurrent symptoms following improvement and vascular risk factors including cessation of tobacco abuse, improved blood pressure and lipid control.  She has an appointment with Dr. Burt Knack on 01/26/2018  at 3:40 PM.  Detailed driving instructions were given to the patient.  2. Lacunar infarct/TIA: Followed by neurology.  Remains on Plavix 75 mg daily.  Continue with aggressive risk factor modification and secondary prevention as outlined below.  No residual deficits at this time.  Patient to be evaluated by the structural heart team on 01/26/2018.  3. Mitral regurgitation: Asymptomatic.  Monitor with periodic echocardiogram.  4. Hypertension: Blood pressure is well controlled today.  Continue amlodipine 10 mg daily.  5. Hyperlipidemia: LDL of 93 from 12/2017.  Goal LDL less than 70.  Increase Crestor to 40 mg daily.  Recheck fasting lipid and liver function in approximately 8 weeks.  Order has been placed.  If LDL is not at goal at that time recommend addition of Zetia 10 mg daily.  6. Prior tobacco abuse: Patient has not smoked since 08/2017.  Disposition: F/u with Dr. Rockey Situ or an APP in 6 months.  Follow-up with Dr. Burt Knack in [place] on 01/26/2018.  Current medicines are reviewed at length with the patient today.  The patient did not have any  concerns regarding medicines.  Signed, Christell Faith, PA-C 01/22/2018 11:06 AM     [facility] [place] [address] [place] [address] [place],  [address] [phone]

## 2018-01-22 ENCOUNTER — Ambulatory Visit (INDEPENDENT_AMBULATORY_CARE_PROVIDER_SITE_OTHER): Payer: 59 | Admitting: Physician Assistant

## 2018-01-22 ENCOUNTER — Encounter: Payer: Self-pay | Admitting: Physician Assistant

## 2018-01-22 VITALS — BP 122/70 | HR 59 | Ht 65.0 in | Wt 160.0 lb

## 2018-01-22 DIAGNOSIS — G459 Transient cerebral ischemic attack, unspecified: Secondary | ICD-10-CM

## 2018-01-22 DIAGNOSIS — Z87891 Personal history of nicotine dependence: Secondary | ICD-10-CM

## 2018-01-22 DIAGNOSIS — Q211 Atrial septal defect: Secondary | ICD-10-CM | POA: Diagnosis not present

## 2018-01-22 DIAGNOSIS — Q2112 Patent foramen ovale: Secondary | ICD-10-CM

## 2018-01-22 DIAGNOSIS — I10 Essential (primary) hypertension: Secondary | ICD-10-CM

## 2018-01-22 DIAGNOSIS — E785 Hyperlipidemia, unspecified: Secondary | ICD-10-CM

## 2018-01-22 DIAGNOSIS — I6381 Other cerebral infarction due to occlusion or stenosis of small artery: Secondary | ICD-10-CM | POA: Diagnosis not present

## 2018-01-22 MED ORDER — ROSUVASTATIN CALCIUM 40 MG PO TABS: 40.0000 mg | ORAL_TABLET | Freq: Every day | ORAL | 1 refills | Status: DC

## 2018-01-22 NOTE — Patient Instructions (Signed)
Medication Instructions:  INCREASE the Crestor to 40 mg daily  If you need a refill on your cardiac medications before your next appointment, please call your pharmacy.   Lab work: Your provider would like for you to return in 2 months to have the following labs drawn: FASTING lipid and liver. Please go to the [facility] entrance and check in at the front desk. You do not need an appointment.   If you have labs (blood work) drawn today and your tests are completely normal, you will receive your results only by: Marland Kitchen MyChart Message (if you have MyChart) OR . A paper copy in the mail If you have any lab test that is abnormal or we need to change your treatment, we will call you to review the results.  Testing/Procedures: None ordered  Follow-Up: At [facility], you and your health needs are our priority.  As part of our continuing mission to provide you with exceptional heart care, we have created designated Provider Care Teams.  These Care Teams include your primary Cardiologist (physician) and Advanced Practice Providers (APPs -  Physician Assistants and Nurse Practitioners) who all work together to provide you with the care you need, when you need it. You will need a follow up appointment in 6 months.  Please call our office 2 months in advance to schedule this appointment.  You may see Dr. Rockey Situ or one of the following Advanced Practice Providers on your designated Care Team:   Murray Hodgkins, NP Christell Faith, PA-C . Marrianne Mood, PA-C

## 2018-01-26 ENCOUNTER — Encounter: Payer: Self-pay | Admitting: Cardiovascular Disease

## 2018-01-26 ENCOUNTER — Ambulatory Visit (INDEPENDENT_AMBULATORY_CARE_PROVIDER_SITE_OTHER): Payer: 59 | Admitting: Cardiovascular Disease

## 2018-01-26 VITALS — BP 108/70 | HR 59 | Ht 65.0 in | Wt 161.4 lb

## 2018-01-26 DIAGNOSIS — Q211 Atrial septal defect: Secondary | ICD-10-CM

## 2018-01-26 DIAGNOSIS — Q2112 Patent foramen ovale: Secondary | ICD-10-CM

## 2018-01-26 NOTE — Progress Notes (Signed)
Cardiology Office Note:    Date:  01/26/2018   ID:  Alyssa Matthews, DOB 04-04-1960, MRN [id number]  PCP:  Alyssa Haven, MD  Cardiologist:  No primary care provider on file.  Electrophysiologist:  None   Referring MD: Alyssa Haven, MD   Chief Complaint  Patient presents with  . PFO evaluation   History of Present Illness:    Alyssa Matthews is a 57 y.o. female with a hx of TIA, presenting for PFO evaluation.   In June 2019 she presented with a TIA with symptoms of facial numbness, slurred speech, and left leg numbness. Her symptoms resolved within 1-2 hours. MRI at the time demonstrated old pontine lacunar infarcts without any other acute changes.  An echocardiogram with bubble study demonstrated the presence of bubbles in the left heart suggestive of a PFO.  The patient has not undergone a TEE study.  She again was admitted for a TIA in October with slurred speech, left leg numbness, and headache. MRI that admission showed no acute changes.   She otherwise is doing well.  The patient is here alone today.  She denies chest pain, shortness of breath, or heart palpitations.  She has quit smoking and she is compliant with her medications.  Past Medical History:  Diagnosis Date  . Abdominal pain, left lower quadrant 06/29/2015  . Benign neoplasm of sigmoid colon   . Bilateral numbness of feet 06/29/2015  . Chronic left shoulder pain 05/17/2017  . Decreased sex drive 06/18/6544  . Dyspnea on exertion 04/18/2016  . Elevated glucose 05/18/2015  . Elevated LDL cholesterol level 06/29/2015  . Hand pain 01/01/2015  . History of blood transfusion   . Hypercholesteremia   . Hypertension   . Hyperthyroidism    a. s/p RAI  . Lacunar infarction ([place])    a. MRI brain 6/19: remote lacunar infarcts involving the pons  . Left ear pain 05/18/2015  . Left hip pain 01/01/2018  . Leg pain 04/16/2015  . Lipoma 04/16/2015  . PFO (patent foramen ovale)    a. TTE 6/19: EF 60-65%, no RWMA, Gr1DD,  mild MR, RVSF normal, patent foramen ovale was noted with a positive saline contrast study, PASP normal  . Prediabetes 05/18/2015  . Rib contusion, left, initial encounter 06/28/2016  . Sebaceous cyst of labia 12/05/2016  . Shoulder pain, bilateral 01/15/2015  . Special screening for malignant neoplasms, colon   . Statin intolerance 05/18/2015  . Subclinical hyperthyroidism 06/28/2016  . TIA (transient ischemic attack) 08/2017  . Tobacco abuse 05/18/2015  . URI (upper respiratory infection) 05/17/2017  . Vulvar lesion 10/31/2016  . Wears dentures    partial upper  . Weight loss 10/31/2016    Past Surgical History:  Procedure Laterality Date  . BREAST CYST ASPIRATION  2015  . COLONOSCOPY WITH PROPOFOL N/A 03/30/2015   Procedure: COLONOSCOPY WITH PROPOFOL;  Surgeon: Lucilla Lame, MD;  Location: [place];  Service: Endoscopy;  Laterality: N/A;  . DILATION AND CURETTAGE OF UTERUS    . POLYPECTOMY  03/30/2015   Procedure: POLYPECTOMY;  Surgeon: Lucilla Lame, MD;  Location: [place];  Service: Endoscopy;;    Current Medications: Current Meds  Medication Sig  . amLODipine (NORVASC) 10 MG tablet TAKE 1 TABLET (10 MG TOTAL) BY MOUTH DAILY.  Marland Kitchen clopidogrel (PLAVIX) 75 MG tablet Take 1 tablet (75 mg total) by mouth daily.  . Multiple Vitamin (MULTIVITAMIN WITH MINERALS) TABS tablet Take 1 tablet by mouth daily.  . rosuvastatin (  CRESTOR) 40 MG tablet Take 1 tablet (40 mg total) by mouth daily.  . Vitamin D, Ergocalciferol, (DRISDOL) 50000 units CAPS capsule Take 1 capsule (50,000 Units total) by mouth every 7 (seven) days.     Allergies:   Patient has no known allergies.   Social History   Socioeconomic History  . Marital status: Married    Spouse name: Not on file  . Number of children: Not on file  . Years of education: Not on file  . Highest education level: Not on file  Occupational History  . Not on file  Social Needs  . Financial resource strain: Not on file  . Food  insecurity:    Worry: Not on file    Inability: Not on file  . Transportation needs:    Medical: Not on file    Non-medical: Not on file  Tobacco Use  . Smoking status: Former Smoker    Packs/day: 0.75    Years: 30.00    Pack years: 22.50    Types: Cigarettes    Last attempt to quit: 08/2017    Years since quitting: 0.4  . Smokeless tobacco: Never Used  Substance and Sexual Activity  . Alcohol use: Not Currently    Alcohol/week: 0.0 standard drinks    Frequency: Never  . Drug use: No  . Sexual activity: Yes  Lifestyle  . Physical activity:    Days per week: Not on file    Minutes per session: Not on file  . Stress: Not on file  Relationships  . Social connections:    Talks on phone: Not on file    Gets together: Not on file    Attends religious service: Not on file    Active member of club or organization: Not on file    Attends meetings of clubs or organizations: Not on file    Relationship status: Not on file  Other Topics Concern  . Not on file  Social History Narrative   Patient works on an [employer], television/film set.     Family History: The patient's family history includes Cancer in her father; Drug abuse in her paternal grandmother; Hypertension in her brother and mother; Kidney disease in her brother. There is no history of Breast cancer or Thyroid disease.  ROS:   Please see the history of present illness.    All other systems reviewed and are negative.  EKGs/Labs/Other Studies Reviewed:    The following studies were reviewed today: MRI Brain 08/31/2017: IMPRESSION: 1. Remote lacunar infarcts involving the pons. 2. No acute intracranial abnormality explain the patient's symptoms. Ischemia in the region of the previous infarcts could cause contralateral face and lower extremity symptoms.  Echo 09-01-2017: Study Conclusions  - Left ventricle: The cavity size was normal. Systolic function was   normal. The estimated ejection fraction was in the range of 60%   to  65%. Wall motion was normal; there were no regional wall   motion abnormalities. Doppler parameters are consistent with   abnormal left ventricular relaxation (grade 1 diastolic   dysfunction). - Mitral valve: There was mild regurgitation. - Right ventricle: Systolic function was normal. - Atrial septum: There was a patent foramen ovale. Positive saline   contrast study - Pulmonary arteries: Systolic pressure was within the normal   range.   EKG:  EKG is not ordered today.    Recent Labs: 12/22/2017: ALT 21; BUN 17; Creatinine, Ser 0.62; Hemoglobin 12.3; Platelets 290; Potassium 4.0; Sodium 140; TSH 0.716  Recent Lipid  Panel    Component Value Date/Time   CHOL 182 12/23/2017 0516   TRIG 45 12/23/2017 0516   HDL 80 12/23/2017 0516   CHOLHDL 2.3 12/23/2017 0516   VLDL 9 12/23/2017 0516   LDLCALC 93 12/23/2017 0516   LDLDIRECT 67.0 10/16/2017 0856    Physical Exam:    VS:  BP 108/70   Pulse (!) 59   Ht 5\' 5"  (1.651 m)   Wt 161 lb 6.4 oz (73.2 kg)   SpO2 96%   BMI 26.86 kg/m     Wt Readings from Last 3 Encounters:  01/26/18 161 lb 6.4 oz (73.2 kg)  01/22/18 160 lb (72.6 kg)  01/01/18 157 lb 6.4 oz (71.4 kg)     GEN:  Well nourished, well developed in no acute distress HEENT: Normal NECK: No JVD; No carotid bruits LYMPHATICS: No lymphadenopathy CARDIAC: RRR, no murmurs, rubs, gallops RESPIRATORY:  Clear to auscultation without rales, wheezing or rhonchi  ABDOMEN: Soft, non-tender, non-distended MUSCULOSKELETAL:  No edema; No deformity  SKIN: Warm and dry NEUROLOGIC:  Alert and oriented x 3 PSYCHIATRIC:  Normal affect   ASSESSMENT:    1. PFO (patent foramen ovale)    PLAN:    In order of problems listed above:  1. I have personally reviewed the patient's echo images.  She does have a positive bubble study but it is not indicative of a large shunt.  The patient has no cardiopulmonary symptoms.  She has not had typical MRI findings of a cardioembolic event.   Her MRI data is reviewed and she is had evidence of lacunar type infarcts.  Stroke risk factors include hypertension and tobacco abuse.  I had a lengthy discussion with the patient today regarding the potential mechanism of PFO-related stroke with paradoxical embolism of venous thrombi.  We reviewed all available data comparing transcatheter PFO closure to medical therapy.  Considering lacunar type infarcts on MRI, history of uncontrolled hypertension and tobacco abuse, I think it is less likely that this patient's strokes/TIAs are related to her PFO.  I would be inclined to manage her medically.  She is done a nice job now with controlling her blood pressure, taking her medicines regularly, and has been successful at complete tobacco cessation.  Certainly if she has further problems or any evidence of a cardioembolic stroke, PFO closure would be a reasonable consideration in the future.  She would require a transesophageal echocardiogram to better evaluate her interatrial septal anatomy.  All of her questions are answered today.  I would be happy to see her back in the future as needed.   Medication Adjustments/Labs and Tests Ordered: Current medicines are reviewed at length with the patient today.  Concerns regarding medicines are outlined above.  No orders of the defined types were placed in this encounter.  No orders of the defined types were placed in this encounter.   There are no Patient Instructions on file for this visit.   Signed, Sherren Mocha, MD  01/26/2018 5:36 PM    Alyssa Matthews

## 2018-02-01 ENCOUNTER — Other Ambulatory Visit (INDEPENDENT_AMBULATORY_CARE_PROVIDER_SITE_OTHER): Payer: 59

## 2018-02-01 DIAGNOSIS — E785 Hyperlipidemia, unspecified: Secondary | ICD-10-CM

## 2018-02-01 LAB — HEPATIC FUNCTION PANEL
ALT: 16 U/L (ref 0–35)
AST: 17 U/L (ref 0–37)
Albumin: 4 g/dL (ref 3.5–5.2)
Alkaline Phosphatase: 69 U/L (ref 39–117)
BILIRUBIN DIRECT: 0 mg/dL (ref 0.0–0.3)
BILIRUBIN TOTAL: 0.3 mg/dL (ref 0.2–1.2)
TOTAL PROTEIN: 6.7 g/dL (ref 6.0–8.3)

## 2018-02-01 LAB — LDL CHOLESTEROL, DIRECT: LDL DIRECT: 54 mg/dL

## 2018-02-11 ENCOUNTER — Other Ambulatory Visit: Payer: Self-pay | Admitting: Family Medicine

## 2018-02-11 DIAGNOSIS — E559 Vitamin D deficiency, unspecified: Secondary | ICD-10-CM

## 2018-02-13 ENCOUNTER — Telehealth: Payer: Self-pay | Admitting: *Deleted

## 2018-02-13 NOTE — Telephone Encounter (Signed)
Copied from [place] [id number]. Topic: General - Inquiry >> Feb 13, 2018 11:46 AM Vernona Rieger wrote: Reason for CRM: D[place] Ar with [employer] called and said she received the forms back and there were some information missing. Section 3 frequency and duration/ how long and often does she need to be out for her condition. It was written " twice a year " , they do not honor just twice a year. Also, are her issues re-occurring? Are the symptoms re-occurring and if so how long does she need to be out of work for that?  Fax back @ [phone]

## 2018-02-13 NOTE — Telephone Encounter (Signed)
Sent to PCP ?

## 2018-02-14 ENCOUNTER — Other Ambulatory Visit: Payer: 59

## 2018-02-15 NOTE — Telephone Encounter (Signed)
Noted.  Her issues are related to 2 TIAs she has had.  The issues may not be re-occurring though there is the potential for her to have recurrent symptoms in the future with a TIA in the future.  This is the reason for me stating that she could possibly be out up to a couple of times a year if she has recurrent issues.  If they do not honor this we can fill out FMLA paperwork in the future if she has recurrent issues.

## 2018-02-16 ENCOUNTER — Telehealth: Payer: Self-pay

## 2018-02-16 NOTE — Telephone Encounter (Signed)
Extra question for FMLA.

## 2018-02-16 NOTE — Telephone Encounter (Signed)
Pt called back in to be advised. Pt says that she would like to have FMLA in place for just incase, to secure her job incase. Pt says that she don't want to have to worry about her job just incase she have this concern again.    Pt is requesting a call back to discuss this directly.    CB: [phone]

## 2018-02-16 NOTE — Telephone Encounter (Signed)
Copied from [place] [phone]. Topic: General - Inquiry >> Feb 13, 2018 11:46 AM Vernona Rieger wrote: Reason for CRM: D[place] Ar with [employer] called and said she received the forms back and there were some information missing. Section 3 frequency and duration/ how long and often does she need to be out for her condition. It was written " twice a year " , they do not honor just twice a year. Also, are her issues re-occurring? Are the symptoms re-occurring and if so how long does she need to be out of work for that?  Fax back @ [phone] >> Feb 16, 2018 10:05 AM Ahmed Prima L wrote: [employer] called back and stated that they can not certify 2 times a year. It has to be more frequent. If that is the only information that Dr Caryl Bis can submit, they can not approve it. Please Advise.

## 2018-02-16 NOTE — Telephone Encounter (Signed)
I do not have additional FMLA paperwork for the patient.  I previously filled this out for her though based on what I placed it appears they will not accept it.  Please see my outline message below to relay the information to the [facility] provider.

## 2018-02-16 NOTE — Telephone Encounter (Signed)
Please see the other phone note regarding this.

## 2018-02-16 NOTE — Telephone Encounter (Signed)
Do you have FMLA  Paperwork for this patient?

## 2018-02-20 ENCOUNTER — Ambulatory Visit: Payer: 59 | Admitting: Endocrinology

## 2018-02-26 NOTE — Telephone Encounter (Signed)
I believe I filled out additional FMLA after one of her recent office visits.

## 2018-02-26 NOTE — Telephone Encounter (Signed)
Patient insurance is wanting form amended and faxed back , is this the FMLA you completed in June?

## 2018-03-01 ENCOUNTER — Ambulatory Visit: Payer: 59 | Admitting: Cardiovascular Disease

## 2018-03-22 ENCOUNTER — Other Ambulatory Visit (INDEPENDENT_AMBULATORY_CARE_PROVIDER_SITE_OTHER): Payer: 59

## 2018-03-22 DIAGNOSIS — E0520 Thyrotoxicosis with toxic multinodular goiter without thyrotoxic crisis or storm: Secondary | ICD-10-CM

## 2018-03-22 LAB — T4, FREE: Free T4: 0.9 ng/dL (ref 0.60–1.60)

## 2018-03-22 LAB — T3, FREE: T3, Free: 3.4 pg/mL (ref 2.3–4.2)

## 2018-03-22 LAB — TSH: TSH: 0.48 u[IU]/mL (ref 0.35–4.50)

## 2018-03-26 ENCOUNTER — Ambulatory Visit (INDEPENDENT_AMBULATORY_CARE_PROVIDER_SITE_OTHER): Payer: 59 | Admitting: Endocrinology

## 2018-03-26 ENCOUNTER — Encounter: Payer: Self-pay | Admitting: Endocrinology

## 2018-03-26 VITALS — BP 118/72 | HR 74 | Ht 65.0 in | Wt 168.0 lb

## 2018-03-26 DIAGNOSIS — E042 Nontoxic multinodular goiter: Secondary | ICD-10-CM | POA: Diagnosis not present

## 2018-03-26 NOTE — Progress Notes (Signed)
Patient ID: Alyssa Matthews, female   DOB: 30-Nov-1960, 58 y.o.   MRN: [id number]                                                                                             Chief complaint: Follow-up of thyroid   History of Present Illness:   Background history obtained on the initial consultation: She was seen by her PCP for general physical exam and had TSH done as a screening test Although the patient says that she feels fine and has no physical symptoms she was reporting that she was getting out of breath on climbing stairs to her PCP Also for an uncertain period of time occasionally she feels her heart beating fast but mostly when she is nervous or tense She has not had any weight loss She does not think she has any shakiness, feeling excessively warm and sweaty, nervousness, and fatigue.  She tends to feel hot and cold alternately and this has been going on for some time   She was found to have  toxic nodular goiter on her initial exam Subsequently her free T3 levels had been fluctuating but on her visit in October 2018 it was high normal at 4.0 She had also had some complaints of weight loss which was not explain otherwise  RECENT history:  She received RADIOACTIVE IODINE treatment of 29.5 mCi in 03/2017 Although she had a low uptake of 9.5% she had an overactive nodule on the left side on the scan occupying most of the lobe  She has had improved thyroid functions on follow-up Subsequently her thyroid levels have stayed normal including in 03/2018   She feels fairly well has only mild occasional hot flashes at night Has gained some weight recently    Wt Readings from Last 3 Encounters:  03/26/18 168 lb (76.2 kg)  01/26/18 161 lb 6.4 oz (73.2 kg)  01/22/18 160 lb (72.6 kg)      Thyroid function tests as follows:     Lab Results  Component Value Date   FREET4 0.90 03/22/2018   FREET4 0.81 08/21/2017   FREET4 0.73 04/17/2017   T3FREE 3.4 03/22/2018   T3FREE 3.6  08/21/2017   T3FREE 3.4 04/17/2017   TSH 0.48 03/22/2018   TSH 0.716 12/22/2017   TSH 0.40 08/21/2017    Lab Results  Component Value Date   THYROTRECAB 0.62 05/26/2016     Allergies as of 03/26/2018   No Known Allergies     Medication List       Accurate as of March 26, 2018 10:42 AM. Always use your most recent med list.        amLODipine 10 MG tablet Commonly known as:  NORVASC TAKE 1 TABLET (10 MG TOTAL) BY MOUTH DAILY.   clopidogrel 75 MG tablet Commonly known as:  PLAVIX Take 1 tablet (75 mg total) by mouth daily.   multivitamin with minerals Tabs tablet Take 1 tablet by mouth daily.   rosuvastatin 40 MG tablet Commonly known as:  CRESTOR Take 1 tablet (40 mg total) by mouth daily.  Past Medical History:  Diagnosis Date  . Abdominal pain, left lower quadrant 06/29/2015  . Benign neoplasm of sigmoid colon   . Bilateral numbness of feet 06/29/2015  . Chronic left shoulder pain 05/17/2017  . Decreased sex drive 09/13/7104  . Dyspnea on exertion 04/18/2016  . Elevated glucose 05/18/2015  . Elevated LDL cholesterol level 06/29/2015  . Hand pain 01/01/2015  . History of blood transfusion   . Hypercholesteremia   . Hypertension   . Hyperthyroidism    a. s/p RAI  . Lacunar infarction ([place])    a. MRI brain 6/19: remote lacunar infarcts involving the pons  . Left ear pain 05/18/2015  . Left hip pain 01/01/2018  . Leg pain 04/16/2015  . Lipoma 04/16/2015  . PFO (patent foramen ovale)    a. TTE 6/19: EF 60-65%, no RWMA, Gr1DD, mild MR, RVSF normal, patent foramen ovale was noted with a positive saline contrast study, PASP normal  . Prediabetes 05/18/2015  . Rib contusion, left, initial encounter 06/28/2016  . Sebaceous cyst of labia 12/05/2016  . Shoulder pain, bilateral 01/15/2015  . Special screening for malignant neoplasms, colon   . Statin intolerance 05/18/2015  . Subclinical hyperthyroidism 06/28/2016  . TIA (transient ischemic attack) 08/2017  . Tobacco  abuse 05/18/2015  . URI (upper respiratory infection) 05/17/2017  . Vulvar lesion 10/31/2016  . Wears dentures    partial upper  . Weight loss 10/31/2016    Past Surgical History:  Procedure Laterality Date  . BREAST CYST ASPIRATION  2015  . COLONOSCOPY WITH PROPOFOL N/A 03/30/2015   Procedure: COLONOSCOPY WITH PROPOFOL;  Surgeon: Lucilla Lame, MD;  Location: [place];  Service: Endoscopy;  Laterality: N/A;  . DILATION AND CURETTAGE OF UTERUS    . POLYPECTOMY  03/30/2015   Procedure: POLYPECTOMY;  Surgeon: Lucilla Lame, MD;  Location: [facility];  Service: Endoscopy;;    Family History  Problem Relation Age of Onset  . Hypertension Mother   . Kidney disease Brother   . Cancer Father        In neck  . Drug abuse Paternal Grandmother   . Hypertension Brother   . Breast cancer Neg Hx   . Thyroid disease Neg Hx     Social History:  reports that she quit smoking about 7 months ago. Her smoking use included cigarettes. She has a 22.50 pack-year smoking history. She has never used smokeless tobacco. She reports previous alcohol use. She reports that she does not use drugs.  Allergies: No Known Allergies   Review of Systems  She takes Norvasc 10 mg for hypertension from her PCP   Examination:   BP 118/72 (BP Location: Left Arm, Patient Position: Sitting, Cuff Size: Normal)   Pulse 74   Ht 5\' 5"  (1.651 m)   Wt 168 lb (76.2 kg)   SpO2 94%   BMI 27.96 kg/m    The thyroid is palpable on the left side about 2 times normal, this is very firm and smooth Right side is just palpable about 1-1/2 times normal, soft-firm   Biceps reflexes on the right are normal    Assessment/Plan:  TOXIC NODULE with multinodular goiter  She had subclinical hyperthyroidism with upper normal T3 levels; may have had some weight loss from mild hyperthyroidism  She has been treated with I-131 Subsequently has been euthyroid  Her thyroid enlargement has improved since her I-131  treatment although left lobe is still about the same This represented the overactive part Right lobe  is not clearly palpable now  She can now be followed annually for her goiter but to call if she has any unusual symptoms such as weight loss  Elayne Snare 03/26/2018, 10:42 AM

## 2018-04-01 ENCOUNTER — Other Ambulatory Visit: Payer: Self-pay | Admitting: Family Medicine

## 2018-04-17 DIAGNOSIS — G459 Transient cerebral ischemic attack, unspecified: Secondary | ICD-10-CM | POA: Diagnosis not present

## 2018-04-24 ENCOUNTER — Other Ambulatory Visit: Payer: Self-pay | Admitting: Family Medicine

## 2018-04-24 DIAGNOSIS — Z1231 Encounter for screening mammogram for malignant neoplasm of breast: Secondary | ICD-10-CM

## 2018-05-04 ENCOUNTER — Encounter: Payer: Self-pay | Admitting: Family Medicine

## 2018-05-04 ENCOUNTER — Ambulatory Visit (INDEPENDENT_AMBULATORY_CARE_PROVIDER_SITE_OTHER): Payer: 59 | Admitting: Family Medicine

## 2018-05-04 DIAGNOSIS — J069 Acute upper respiratory infection, unspecified: Secondary | ICD-10-CM

## 2018-05-04 DIAGNOSIS — E7800 Pure hypercholesterolemia, unspecified: Secondary | ICD-10-CM

## 2018-05-04 DIAGNOSIS — Q211 Atrial septal defect: Secondary | ICD-10-CM

## 2018-05-04 DIAGNOSIS — G459 Transient cerebral ischemic attack, unspecified: Secondary | ICD-10-CM

## 2018-05-04 DIAGNOSIS — E669 Obesity, unspecified: Secondary | ICD-10-CM | POA: Insufficient documentation

## 2018-05-04 DIAGNOSIS — I10 Essential (primary) hypertension: Secondary | ICD-10-CM

## 2018-05-04 DIAGNOSIS — E663 Overweight: Secondary | ICD-10-CM

## 2018-05-04 DIAGNOSIS — E66811 Obesity, class 1: Secondary | ICD-10-CM | POA: Insufficient documentation

## 2018-05-04 DIAGNOSIS — Q2112 Patent foramen ovale: Secondary | ICD-10-CM

## 2018-05-04 MED ORDER — FLUTICASONE PROPIONATE 50 MCG/ACT NA SUSP: 2.0000 | Freq: Every day | NASAL | 6 refills | Status: DC

## 2018-05-04 NOTE — Assessment & Plan Note (Signed)
Discussed decreasing ice cream and sweet tea by half.  Encouraged adding 2 to 3 days of moderate exercise.

## 2018-05-04 NOTE — Assessment & Plan Note (Signed)
Patient has seen cardiology.  She is being treated medically currently.  If she has recurrent TIA they may consider treatment of her PFO.

## 2018-05-04 NOTE — Progress Notes (Signed)
Tommi Rumps, MD Phone: [phone]  TAKINA BUSSER is a 58 y.o. female who presents today for f/u.  CC: Hypertension, hyperlipidemia, TIA, overweight, sinus congestion  Hypertension: Not checking blood pressures.  Taking amlodipine.  No chest pain, shortness of breath, or edema.  Hyperlipidemia: Taking Crestor.  No right upper quadrant pain.  No significant myalgias.  Does note her legs hurt when she is at work standing on concrete. TIA: She notes no recurrent symptoms.  Currently on Plavix.  Notes her neurologist added back 81 mg aspirin.  No numbness, weakness, or speech changes.  Overweight: Patient notes her weight is trended up.  She is eating lots of sweets and she quit smoking.  She is cutting back on candy.  She eats ice cream every day.  Also has sweet tea every day.  No specific exercise.  Sinus congestion: Patient notes symptoms starting a little over a week ago.  She has nasal and sinus congestion with yellow mucus out of her nose.  She felt feverish last week.  No postnasal drip.  Some cough.  No ear symptoms.  Some sore throat.  She took Alka-Seltzer cold and flu.  She has progressively improved though still has some congestion.  Social History   Tobacco Use  Smoking Status Former Smoker  . Packs/day: 0.75  . Years: 30.00  . Pack years: 22.50  . Types: Cigarettes  . Last attempt to quit: 08/2017  . Years since quitting: 0.7  Smokeless Tobacco Never Used     ROS see history of present illness  Objective  Physical Exam Vitals:   05/04/18 1015  BP: 120/70  Pulse: 76  Temp: 98.3 F (36.8 C)  SpO2: 97%    BP Readings from Last 3 Encounters:  05/04/18 120/70  03/26/18 118/72  01/26/18 108/70   Wt Readings from Last 3 Encounters:  05/04/18 171 lb 9.6 oz (77.8 kg)  03/26/18 168 lb (76.2 kg)  01/26/18 161 lb 6.4 oz (73.2 kg)    Physical Exam Constitutional:      General: She is not in acute distress.    Appearance: She is not diaphoretic.    HENT:     Head: Normocephalic and atraumatic.     Comments: No sinus tenderness to percussion    Right Ear: Tympanic membrane normal.     Left Ear: Tympanic membrane normal.     Mouth/Throat:     Mouth: Mucous membranes are moist.     Pharynx: Oropharynx is clear.  Eyes:     Conjunctiva/sclera: Conjunctivae normal.     Pupils: Pupils are equal, round, and reactive to light.  Cardiovascular:     Rate and Rhythm: Normal rate and regular rhythm.     Heart sounds: Normal heart sounds.  Pulmonary:     Effort: Pulmonary effort is normal.     Breath sounds: Normal breath sounds.  Skin:    General: Skin is warm and dry.  Neurological:     Mental Status: She is alert.     Comments: CN 2-12 intact, 5/5 strength in bilateral biceps, triceps, grip, quads, hamstrings, plantar and dorsiflexion, sensation to light touch intact in bilateral UE and LE, normal gait      Assessment/Plan: Please see individual problem list.  Essential hypertension At goal.  Continue current medication.  TIA (transient ischemic attack) No recurrence of symptoms.  She will continue her current regimen and continue to follow with neurology.  PFO (patent foramen ovale) Patient has seen cardiology.  She is being  treated medically currently.  If she has recurrent TIA they may consider treatment of her PFO.  Elevated LDL cholesterol level Continue Crestor.  URI (upper respiratory infection) Symptoms most consistent with viral URI.  Discussed Flonase for treatment of symptoms.  If not improving further she will let us know.  Overweight Discussed decreasing ice cream and sweet tea by half.  Encouraged adding 2 to 3 days of moderate exercise.   Health Maintenance: Patient will return in 4 months for physical exam and consideration of Pap smear.  No orders of the defined types were placed in this encounter.   Meds ordered this encounter  Medications  . fluticasone (FLONASE) 50 MCG/ACT nasal spray    Sig:  Place 2 sprays into both nostrils daily.    Dispense:  16 g    Refill:  [place], MD [place] \

## 2018-05-04 NOTE — Assessment & Plan Note (Signed)
No recurrence of symptoms.  She will continue her current regimen and continue to follow with neurology.

## 2018-05-04 NOTE — Assessment & Plan Note (Signed)
Continue Crestor 

## 2018-05-04 NOTE — Assessment & Plan Note (Signed)
Symptoms most consistent with viral URI.  Discussed Flonase for treatment of symptoms.  If not improving further she will let us know.

## 2018-05-04 NOTE — Patient Instructions (Signed)
Nice to see you. Please try the Flonase for your sinus congestion.  If it is not beneficial please let us know.  If you worsen again please let us know. Please try to cut down on ice cream and sweet tea intake.  Please try to cut this in half.  Please try to add 2 to 3 days a week of moderate exercise.

## 2018-05-04 NOTE — Assessment & Plan Note (Signed)
At goal. Continue current medication. 

## 2018-05-28 ENCOUNTER — Other Ambulatory Visit: Payer: Self-pay | Admitting: Family Medicine

## 2018-05-28 DIAGNOSIS — E785 Hyperlipidemia, unspecified: Secondary | ICD-10-CM

## 2018-05-28 DIAGNOSIS — G459 Transient cerebral ischemic attack, unspecified: Secondary | ICD-10-CM

## 2018-05-28 MED ORDER — ROSUVASTATIN CALCIUM 40 MG PO TABS: 40.0000 mg | ORAL_TABLET | Freq: Every day | ORAL | 1 refills | Status: DC

## 2018-05-28 MED ORDER — CLOPIDOGREL BISULFATE 75 MG PO TABS: 75.0000 mg | ORAL_TABLET | Freq: Every day | ORAL | 1 refills | Status: DC

## 2018-05-28 NOTE — Telephone Encounter (Signed)
Copied from [place] [id number]. Topic: Quick Communication - Rx Refill/Question >> May 28, 2018  9:03 AM Reyne Dumas L wrote: Medication:  clopidogrel (PLAVIX) 75 MG tablet rosuvastatin (CRESTOR) 40 MG tablet  Has the patient contacted their pharmacy? Yes - no refills left (Agent: If no, request that the patient contact the pharmacy for the refill.) (Agent: If yes, when and what did the pharmacy advise?)  Preferred Pharmacy (with phone number or street name): [id number] [address], [place] [phone] (Phone) [phone] (Fax)  Agent: Please be advised that RX refills may take up to 3 business days. We ask that you follow-up with your pharmacy.

## 2018-06-18 ENCOUNTER — Ambulatory Visit: Payer: Self-pay | Admitting: *Deleted

## 2018-06-18 NOTE — Telephone Encounter (Signed)
Pt has been scheduled for a virtual visit  

## 2018-06-18 NOTE — Telephone Encounter (Signed)
Summary: Sinus    Pt states she is seeing a small amount of blood in the mucus when she blows her nose. She thinks it is sinus related and would like to know how to treat it at home/OTC. Please call pt to advise:   [phone]     Patient started seeing small amounts of blood in her tissue when she blows her nose. Patient is using the prescribed nasal spray for her sinus during the pollen season. Discussed the possibility that the spray could be irritating to her nasal passages as she is not congested.  Will send note to provider for review and recommendation- saline over counter may be advised to lubricate passage way.  Reason for Disposition . [1] Mild-moderate nosebleed AND [2] bleeding stopped now    Patient reports bleeding only when blows nose to remove sinus drainage.  Answer Assessment - Initial Assessment Questions 1. LOCATION: "Where does it hurt?"      Nasal congestion- using spray- helping 2. ONSET: "When did the sinus pain start?"  (e.g., hours, days)      Bleeding started over the weekend- just seeing when blows nose 3. SEVERITY: "How bad is the pain?"   (Scale 1-10; mild, moderate or severe)   - MILD (1-3): doesn't interfere with normal activities    - MODERATE (4-7): interferes with normal activities (e.g., work or school) or awakens from sleep   - SEVERE (8-10): excruciating pain and patient unable to do any normal activities        No pain in nose 4. RECURRENT SYMPTOM: "Have you ever had sinus problems before?" If so, ask: "When was the last time?" and "What happened that time?"      Never has happened in past 5. NASAL CONGESTION: "Is the nose blocked?" If so, ask, "Can you open it or must you breathe through the mouth?"     Patient states she is using nasal spray- she is not closed 6. NASAL DISCHARGE: "Do you have discharge from your nose?" If so ask, "What color?"     whitish 7. FEVER: "Do you have a fever?" If so, ask: "What is it, how was it measured, and when  did it start?"      No fever 8. OTHER SYMPTOMS: "Do you have any other symptoms?" (e.g., sore throat, cough, earache, difficulty breathing)     no 9. PREGNANCY: "Is there any chance you are pregnant?" "When was your last menstrual period?"     n/a  Protocols used: NOSEBLEED-A-AH, SINUS PAIN OR CONGESTION-A-AH

## 2018-06-20 ENCOUNTER — Encounter: Payer: Self-pay | Admitting: Family Medicine

## 2018-06-20 ENCOUNTER — Encounter (INDEPENDENT_AMBULATORY_CARE_PROVIDER_SITE_OTHER): Payer: 59 | Admitting: Family Medicine

## 2018-06-20 ENCOUNTER — Other Ambulatory Visit: Payer: Self-pay

## 2018-06-21 NOTE — Progress Notes (Signed)
Opened in error. Patient canceled her appointment.

## 2018-07-27 ENCOUNTER — Ambulatory Visit: Payer: Self-pay

## 2018-07-27 ENCOUNTER — Ambulatory Visit (INDEPENDENT_AMBULATORY_CARE_PROVIDER_SITE_OTHER): Payer: 59 | Admitting: Family Medicine

## 2018-07-27 ENCOUNTER — Other Ambulatory Visit: Payer: Self-pay

## 2018-07-27 ENCOUNTER — Telehealth: Payer: Self-pay | Admitting: Family Medicine

## 2018-07-27 ENCOUNTER — Encounter: Payer: Self-pay | Admitting: Family Medicine

## 2018-07-27 DIAGNOSIS — R42 Dizziness and giddiness: Secondary | ICD-10-CM | POA: Diagnosis not present

## 2018-07-27 NOTE — Telephone Encounter (Signed)
Patient called and says she's been lightheaded for 2-3 days. She says at work she was lightheaded and had to sit down. Now she's driving and says she's not dizzy, just feels lightheaded, not as bad as at work. She denies any other symptoms. I called the office and spoke to [place], [facility] who asks to speak to the patient, the call was connected successfully.  Answer Assessment - Initial Assessment Questions 1. DESCRIPTION: "Describe your dizziness."     Lightheaded 2. LIGHTHEADED: "Do you feel lightheaded?" (e.g., somewhat faint, woozy, weak upon standing)     Yes 3. VERTIGO: "Do you feel like either you or the room is spinning or tilting?" (i.e. vertigo)     No 4. SEVERITY: "How bad is it?"  "Do you feel like you are going to faint?" "Can you stand and walk?"   - MILD - walking normally   - MODERATE - interferes with normal activities (e.g., work, school)    - SEVERE - unable to stand, requires support to walk, feels like passing out now.      Mild 5. ONSET:  "When did the dizziness begin?"     2-3 days 6. AGGRAVATING FACTORS: "Does anything make it worse?" (e.g., standing, change in head position)     Standing 7. HEART RATE: "Can you tell me your heart rate?" "How many beats in 15 seconds?"  (Note: not all patients can do this)       N/A 8. CAUSE: "What do you think is causing the dizziness?"     I don't know 9. RECURRENT SYMPTOM: "Have you had dizziness before?" If so, ask: "When was the last time?" "What happened that time?"     Yes, ended up having a mini stroke last June 10. OTHER SYMPTOMS: "Do you have any other symptoms?" (e.g., fever, chest pain, vomiting, diarrhea, bleeding)       No 11. PREGNANCY: "Is there any chance you are pregnant?" "When was your last menstrual period?"       No  Protocols used: DIZZINESS [facility]

## 2018-07-27 NOTE — Progress Notes (Signed)
Virtual Visit via video Note  This visit type was conducted due to national recommendations for restrictions regarding the COVID-19 pandemic (e.g. social distancing).  This format is felt to be most appropriate for this patient at this time.  All issues noted in this document were discussed and addressed.  No physical exam was performed (except for noted visual exam findings with Video Visits).   I connected with Alyssa Matthews today at  3:15 PM EDT by a video enabled telemedicine application or telephone and verified that I am speaking with the correct person using two identifiers. Location patient: home Location provider: work Persons participating in the virtual visit: patient, provider  I discussed the limitations, risks, security and privacy concerns of performing an evaluation and management service by telephone and the availability of in person appointments. I also discussed with the patient that there may be a patient responsible charge related to this service. The patient expressed understanding and agreed to proceed.  Reason for visit: same day visit  HPI: Lightheadedness: Patient notes onset of intermittent lightheadedness 2 to 3 days ago.  Typically occurs after she has been standing for some time.  She will feel lightheaded.  This did resolve with sitting down on one occasion.  It lasts for a couple of minutes at the most.  She notes no syncope.  No palpitations.  No chest pain.  No numbness.  No weakness.  No vertigo symptoms.  She does not note any symptoms with change in position of her head.  No ear pain.  No tinnitus.  She does note minimal ankle swelling.  She has been taking amlodipine.  Her blood pressure on last check was 129/67.   ROS: See pertinent positives and negatives per HPI.  Past Medical History:  Diagnosis Date  . Abdominal pain, left lower quadrant 06/29/2015  . Benign neoplasm of sigmoid colon   . Bilateral numbness of feet 06/29/2015  . Chronic left  shoulder pain 05/17/2017  . Decreased sex drive 09/14/5327  . Dyspnea on exertion 04/18/2016  . Elevated glucose 05/18/2015  . Elevated LDL cholesterol level 06/29/2015  . Hand pain 01/01/2015  . History of blood transfusion   . Hypercholesteremia   . Hypertension   . Hyperthyroidism    a. s/p RAI  . Lacunar infarction ([place])    a. MRI brain 6/19: remote lacunar infarcts involving the pons  . Left ear pain 05/18/2015  . Left hip pain 01/01/2018  . Leg pain 04/16/2015  . Lipoma 04/16/2015  . PFO (patent foramen ovale)    a. TTE 6/19: EF 60-65%, no RWMA, Gr1DD, mild MR, RVSF normal, patent foramen ovale was noted with a positive saline contrast study, PASP normal  . Prediabetes 05/18/2015  . Rib contusion, left, initial encounter 06/28/2016  . Sebaceous cyst of labia 12/05/2016  . Shoulder pain, bilateral 01/15/2015  . Special screening for malignant neoplasms, colon   . Statin intolerance 05/18/2015  . Subclinical hyperthyroidism 06/28/2016  . TIA (transient ischemic attack) 08/2017  . Tobacco abuse 05/18/2015  . URI (upper respiratory infection) 05/17/2017  . Vulvar lesion 10/31/2016  . Wears dentures    partial upper  . Weight loss 10/31/2016    Past Surgical History:  Procedure Laterality Date  . BREAST CYST ASPIRATION  2015  . COLONOSCOPY WITH PROPOFOL N/A 03/30/2015   Procedure: COLONOSCOPY WITH PROPOFOL;  Surgeon: Lucilla Lame, MD;  Location: [facility];  Service: Endoscopy;  Laterality: N/A;  . DILATION AND CURETTAGE OF UTERUS    .  POLYPECTOMY  03/30/2015   Procedure: POLYPECTOMY;  Surgeon: Lucilla Lame, MD;  Location: [facility];  Service: Endoscopy;;    Family History  Problem Relation Age of Onset  . Hypertension Mother   . Kidney disease Brother   . Cancer Father        In neck  . Drug abuse Paternal Grandmother   . Hypertension Brother   . Breast cancer Neg Hx   . Thyroid disease Neg Hx     SOCIAL HX: former smoker   Current Outpatient Medications:  .   amLODipine (NORVASC) 10 MG tablet, TAKE 1 TABLET (10 MG TOTAL) BY MOUTH DAILY., Disp: 90 tablet, Rfl: 1 .  aspirin EC 81 MG tablet, Take 81 mg by mouth daily., Disp: , Rfl:  .  clopidogrel (PLAVIX) 75 MG tablet, Take 1 tablet (75 mg total) by mouth daily., Disp: 90 tablet, Rfl: 1 .  Multiple Vitamin (MULTIVITAMIN WITH MINERALS) TABS tablet, Take 1 tablet by mouth daily., Disp: , Rfl:  .  rosuvastatin (CRESTOR) 40 MG tablet, Take 1 tablet (40 mg total) by mouth daily., Disp: 90 tablet, Rfl: 1 .  fluticasone (FLONASE) 50 MCG/ACT nasal spray, Place 2 sprays into both nostrils daily. (Patient not taking: Reported on 07/27/2018), Disp: 16 g, Rfl: 6  EXAM:  VITALS per patient if applicable: none.  GENERAL: alert, oriented, appears well and in no acute distress  HEENT: atraumatic, conjunttiva clear, no obvious abnormalities on inspection of external nose and ears  NECK: normal movements of the head and neck  LUNGS: on inspection no signs of respiratory distress, breathing rate appears normal, no obvious gross SOB, gasping or wheezing  CV: no obvious cyanosis  MS: moves all visible extremities without noticeable abnormality  PSYCH/NEURO: pleasant and cooperative, no obvious depression or anxiety, speech and thought processing grossly intact  ASSESSMENT AND PLAN:  Discussed the following assessment and plan:  Light headedness - Plan: CBC, Basic Metabolic Panel (BMET), TSH  Light headedness Undetermined cause.  Does not seem consistent with orthostasis or a vertiginous cause.  We will have her come in for nurse visit early next week statics and lab work.  Orders have been placed.  I discussed that if she has any prolonged persistence or significant increase in symptoms or any associated new symptoms that she would need to be evaluated in person over the weekend.  She verbalized understanding.  RN will contact the patient to get her scheduled for the nurse visit and lab work for early next  week.   I discussed the assessment and treatment plan with the patient. The patient was provided an opportunity to ask questions and all were answered. The patient agreed with the plan and demonstrated an understanding of the instructions.   The patient was advised to call back or seek an in-person evaluation if the symptoms worsen or if the condition fails to improve as anticipated.   Tommi Rumps, MD

## 2018-07-27 NOTE — Telephone Encounter (Signed)
Please contact the patient Monday morning to get her scheduled for lab work and a nurse visit to check orthostatics to be completed on Monday preferably though could also be done on Tuesday.

## 2018-07-27 NOTE — Assessment & Plan Note (Signed)
Undetermined cause.  Does not seem consistent with orthostasis or a vertiginous cause.  We will have her come in for nurse visit early next week statics and lab work.  Orders have been placed.  I discussed that if she has any prolonged persistence or significant increase in symptoms or any associated new symptoms that she would need to be evaluated in person over the weekend.  She verbalized understanding.

## 2018-07-30 ENCOUNTER — Telehealth: Payer: Self-pay

## 2018-07-30 NOTE — Telephone Encounter (Signed)
Patient scheduled as requested for Tomorrow 07/31/18

## 2018-07-30 NOTE — Telephone Encounter (Signed)
Copied from [place] [phone]. Topic: Appointment Scheduling - Scheduling Inquiry for Clinic >> Jul 30, 2018 10:35 AM Richardo Priest, NT wrote: Reason for CRM: Patient is calling in stating she is in need of scheduling her lab appointment. Call back is [phone].  Pt called and I scheduled her lab appointment.  Degan Hanser,cma

## 2018-07-31 ENCOUNTER — Other Ambulatory Visit (INDEPENDENT_AMBULATORY_CARE_PROVIDER_SITE_OTHER): Payer: 59

## 2018-07-31 ENCOUNTER — Ambulatory Visit (INDEPENDENT_AMBULATORY_CARE_PROVIDER_SITE_OTHER): Payer: 59

## 2018-07-31 ENCOUNTER — Other Ambulatory Visit: Payer: Self-pay

## 2018-07-31 VITALS — Temp 99.0°F

## 2018-07-31 DIAGNOSIS — R42 Dizziness and giddiness: Secondary | ICD-10-CM

## 2018-07-31 LAB — BASIC METABOLIC PANEL
BUN: 14 mg/dL (ref 6–23)
CO2: 29 mEq/L (ref 19–32)
Calcium: 9.1 mg/dL (ref 8.4–10.5)
Chloride: 104 mEq/L (ref 96–112)
Creatinine, Ser: 0.58 mg/dL (ref 0.40–1.20)
GFR: 130.21 mL/min (ref >60.00)
Glucose, Bld: 92 mg/dL (ref 70–99)
Potassium: 3.8 mEq/L (ref 3.5–5.1)
Sodium: 139 mEq/L (ref 135–145)

## 2018-07-31 LAB — CBC
HCT: 34.6 % — ABNORMAL LOW (ref 36.0–46.0)
Hemoglobin: 11.7 g/dL — ABNORMAL LOW (ref 12.0–15.0)
MCHC: 33.9 g/dL (ref 30.0–36.0)
MCV: 84.5 fl (ref 78.0–100.0)
Platelets: 274 10*3/uL (ref 150.0–400.0)
RBC: 4.1 Mil/uL (ref 3.87–5.11)
RDW: 14.6 % (ref 11.5–15.5)
WBC: 5.4 10*3/uL (ref 4.0–10.5)

## 2018-07-31 LAB — TSH: TSH: 0.57 u[IU]/mL (ref 0.35–4.50)

## 2018-07-31 NOTE — Progress Notes (Signed)
Orthostatics noted.  She is not orthostatic.  Please find out if her lightheadedness has been persistent since her office visit.  Thanks.

## 2018-07-31 NOTE — Progress Notes (Signed)
Patient presented today for orthostatic nurse visit due to lightheadness per MD see ov note on 07/27/18.  All vitals taken in left arm with normal bp cuff.  Position BP PR O2  Lying 122/78 59 99  Sitting 120/78 65 97  Standing 120/68 69 99  Standing for 3 mins 124/78 71 99

## 2018-08-02 ENCOUNTER — Other Ambulatory Visit: Payer: 59

## 2018-08-02 ENCOUNTER — Other Ambulatory Visit: Payer: Self-pay | Admitting: Family Medicine

## 2018-08-02 DIAGNOSIS — D649 Anemia, unspecified: Secondary | ICD-10-CM

## 2018-08-02 NOTE — Progress Notes (Signed)
c 

## 2018-08-17 ENCOUNTER — Ambulatory Visit
Admission: RE | Admit: 2018-08-17 | Discharge: 2018-08-17 | Disposition: A | Discharge status: Home / Self Care | Payer: 59 | Source: Ambulatory Visit | Attending: Family Medicine | Admitting: Family Medicine

## 2018-08-17 ENCOUNTER — Other Ambulatory Visit: Payer: Self-pay

## 2018-08-17 DIAGNOSIS — Z1231 Encounter for screening mammogram for malignant neoplasm of breast: Secondary | ICD-10-CM | POA: Insufficient documentation

## 2018-08-20 ENCOUNTER — Other Ambulatory Visit: Payer: Self-pay

## 2018-08-20 ENCOUNTER — Other Ambulatory Visit (INDEPENDENT_AMBULATORY_CARE_PROVIDER_SITE_OTHER): Payer: 59

## 2018-08-20 DIAGNOSIS — D649 Anemia, unspecified: Secondary | ICD-10-CM

## 2018-08-20 LAB — CBC
HCT: 34.7 % — ABNORMAL LOW (ref 36.0–46.0)
Hemoglobin: 11.6 g/dL — ABNORMAL LOW (ref 12.0–15.0)
MCHC: 33.5 g/dL (ref 30.0–36.0)
MCV: 84.6 fl (ref 78.0–100.0)
Platelets: 290 10*3/uL (ref 150.0–400.0)
RBC: 4.1 Mil/uL (ref 3.87–5.11)
RDW: 14.8 % (ref 11.5–15.5)
WBC: 5 10*3/uL (ref 4.0–10.5)

## 2018-08-23 ENCOUNTER — Other Ambulatory Visit: Payer: Self-pay | Admitting: Family Medicine

## 2018-08-23 DIAGNOSIS — D649 Anemia, unspecified: Secondary | ICD-10-CM

## 2018-08-24 ENCOUNTER — Telehealth: Payer: Self-pay

## 2018-08-24 NOTE — Telephone Encounter (Signed)
Virtual Visit Pre-Appointment Phone Call  "Alyssa Matthews, I am calling you today to discuss your upcoming appointment. We are currently trying to limit exposure to the virus that causes COVID-19 by seeing patients at home rather than in the office."  1. "What is the BEST phone number to call the day of the visit?" - include this in appointment notes  2. Do you have or have access to (through a family member/friend) a smartphone with video capability that we can use for your visit?" a. If yes - list this number in appt notes as cell (if different from BEST phone #) and list the appointment type as a VIDEO visit in appointment notes b. If no - list the appointment type as a PHONE visit in appointment notes  3. Confirm consent - "In the setting of the current Covid19 crisis, you are scheduled for a phone visit with your provider on 08/31/2018 at 10:30AM.  Just as we do with many in-office visits, in order for you to participate in this visit, we must obtain consent.  If you'd like, I can send this to your mychart (if signed up) or email for you to review.  Otherwise, I can obtain your verbal consent now.  All virtual visits are billed to your insurance company just like a normal visit would be.  By agreeing to a virtual visit, we'd like you to understand that the technology does not allow for your provider to perform an examination, and thus may limit your provider's ability to fully assess your condition. If your provider identifies any concerns that need to be evaluated in person, we will make arrangements to do so.  Finally, though the technology is pretty good, we cannot assure that it will always work on either your or our end, and in the setting of a video visit, we may have to convert it to a phone-only visit.  In either situation, we cannot ensure that we have a secure connection.  Are you willing to proceed?" STAFF: Did the patient verbally acknowledge consent to telehealth visit? Document YES/NO  here: YES  4. Advise patient to be prepared - "Two hours prior to your appointment, go ahead and check your blood pressure, pulse, oxygen saturation, and your weight (if you have the equipment to check those) and write them all down. When your visit starts, your provider will ask you for this information. If you have an Apple Watch or Kardia device, please plan to have heart rate information ready on the day of your appointment. Please have a pen and paper handy nearby the day of the visit as well."  5. Give patient instructions for MyChart download to smartphone OR [email or web address] as below if video visit (depending on what platform provider is using)  6. Inform patient they will receive a phone call 15 minutes prior to their appointment time (may be from unknown caller ID) so they should be prepared to answer    TELEPHONE CALL NOTE  Alyssa Matthews has been deemed a candidate for a follow-up tele-health visit to limit community exposure during the Covid-19 pandemic. I spoke with the patient via phone to ensure availability of phone/video source, confirm preferred email & phone number, and discuss instructions and expectations.  I reminded Alyssa Matthews to be prepared with any vital sign and/or heart rhythm information that could potentially be obtained via home monitoring, at the time of her visit. I reminded Alyssa Matthews to expect a phone call prior to her visit.  Rene Paci McClain 08/24/2018 11:14 AM    FULL LENGTH CONSENT FOR TELE-HEALTH VISIT   I hereby voluntarily request, consent and authorize [facility] and its employed or contracted physicians, physician assistants, nurse practitioners or other licensed health care professionals (the Practitioner), to provide me with telemedicine health care services (the Services") as deemed necessary by the treating Practitioner. I acknowledge and consent to receive the Services by the Practitioner via telemedicine. I understand  that the telemedicine visit will involve communicating with the Practitioner through live audiovisual communication technology and the disclosure of certain medical information by electronic transmission. I acknowledge that I have been given the opportunity to request an in-person assessment or other available alternative prior to the telemedicine visit and am voluntarily participating in the telemedicine visit.  I understand that I have the right to withhold or withdraw my consent to the use of telemedicine in the course of my care at any time, without affecting my right to future care or treatment, and that the Practitioner or I may terminate the telemedicine visit at any time. I understand that I have the right to inspect all information obtained and/or recorded in the course of the telemedicine visit and may receive copies of available information for a reasonable fee.  I understand that some of the potential risks of receiving the Services via telemedicine include:   Delay or interruption in medical evaluation due to technological equipment failure or disruption;  Information transmitted may not be sufficient (e.g. poor resolution of images) to allow for appropriate medical decision making by the Practitioner; and/or   In rare instances, security protocols could fail, causing a breach of personal health information.  Furthermore, I acknowledge that it is my responsibility to provide information about my medical history, conditions and care that is complete and accurate to the best of my ability. I acknowledge that Practitioner's advice, recommendations, and/or decision may be based on factors not within their control, such as incomplete or inaccurate data provided by me or distortions of diagnostic images or specimens that may result from electronic transmissions. I understand that the practice of medicine is not an exact science and that Practitioner makes no warranties or guarantees regarding  treatment outcomes. I acknowledge that I will receive a copy of this consent concurrently upon execution via email to the email address I last provided but may also request a printed copy by calling the office of [facility].    I understand that my insurance will be billed for this visit.   I have read or had this consent read to me.  I understand the contents of this consent, which adequately explains the benefits and risks of the Services being provided via telemedicine.   I have been provided ample opportunity to ask questions regarding this consent and the Services and have had my questions answered to my satisfaction.  I give my informed consent for the services to be provided through the use of telemedicine in my medical care  By participating in this telemedicine visit I agree to the above.

## 2018-08-27 NOTE — Progress Notes (Signed)
Virtual Visit via Video Note   This visit type was conducted due to national recommendations for restrictions regarding the COVID-19 Pandemic (e.g. social distancing) in an effort to limit this patient's exposure and mitigate transmission in our community.  Due to her co-morbid illnesses, this patient is at least at moderate risk for complications without adequate follow up.  This format is felt to be most appropriate for this patient at this time.  All issues noted in this document were discussed and addressed.  A limited physical exam was performed with this format.  Please refer to the patient's chart for her consent to telehealth for [facility].   Date:  08/31/2018   ID:  [name], DOB 1960/05/23, MRN [id number]  Patient Location: Home Provider Location: Office  PCP:  Leone Haven, MD  Cardiologist:  Ida Rogue, MD  Electrophysiologist:  None   Evaluation Performed:  Follow-Up Visit  Chief Complaint:  Follow up  History of Present Illness:    Alyssa Matthews is a 58 y.o. female with history of TIA in 08/2017 with possible patient reported TIA/CVA~ 4 years prior with recent MRI brain showing prior lacunar infarcts, PFO, HTN, HLD, hyperthyroidism s/p RAI, anemia, prior tobacco abuse quitting in 2019 who presents for follow up of her hypertension and mitral regurgitation.   Patient was initially evaluated by Dr. Rockey Situ in 12/2016 for exertional SOB and chest pain. She was concerned about her symptoms given her family history with her mother having PAD s/p stenting and lower extremity arterial bypass (smoker), father with cancer s/p XRT/chemo (smoker), aunt with cancer s/p chemo (smoker). CT coronary calcium score was ordered, though never completed. Patient was admitted to the hospital on 08/30/2017 with left facial numbness and left lower extremity numbness that started suddenly ~ 2.5 hours prior to her presentation. No administration of tPA given resolution of  symptoms. CT head was not acute. MRI brain showed remote lacunar infarcts involving the pons. Carotid ultrasound was negative for hemodynamically significant stenoses. LDL 108(not on statin at admission), A1c 5.5. Echo with bubble study showed an EF of 60-65%, no RWMA, Gr1DD, mild MR, RVSF normal, patent foramen ovale was noted with a positive saline contrast study, PASP normal. She was seen by neurology and placed on ASA 325 mg daily. Inpatient neurology recommended conservative management of her PFO at that time given the remainder of her vascular risk factors were not adequately controlled at that time. She was seen by outpatient neurology on 09/06/2017 with recommendation to change ASA to Plavix.She was seen by cardiology in hospital follow up in 09/2017 and was doing well from a cardiac perspective. She denied any residual deficits from her stroke. She was noted to have self-discontinued Lipitor secondary to nausea and lower extremity myalgias. The patient's clinical case was discussed/reviewed by our structural heart team in [place] given her history of lacunar infarct and noted PFO on echocardiogram as above. Structural heart team agreed with inpatient neurology that the PFO was small as noted on the bubble study and the patient had multiple uncontrolled risk factors for stroke. Continued conservative management was recommended by both neurology and the structural heart team. She was re-admitted to [facility] in 12/2017 with slurred speech and left leg numbness. CT head showed a hyperdense basilar artery concerning for slow or occluded flow as well as a remote central pontine infarct. CTA head and neck showed minor intracranial and extracranial atherosclerotic change, without large vessel occlusion with a widely patent basilar artery. There was  an incidental left thyroid nodule noted extending to the isthmus. MRI brain showed mild chronic small vessel ischemia without acute intracranial abnormality. In follow up  with cardiology in 01/2018 she was doing well. She was referred to the structural heart clinic with recommendation of continued risk factor modification with medical management given she did not have typical MRI findings of cardioembolic event.   She is seen in telehealth follow-up this morning and is doing well from a cardiac perspective.  No chest pain, shortness of breath, palpitations, dizziness, presyncope, or syncope.  No falls since he was last seen.  No BRBPR or melena.  She does note some mild ankle edema that is present after she has been up on her feet all day and completely resolved after waking up first thing in the morning.  She denies any abdominal distention, orthopnea, PND, early satiety.  She continues to abstain from smoking.  Blood pressure continues to be well controlled in the 102H systolic.  She is tolerating all medications without issues.  Labs: 08/2018 - HGB 11.6 07/2018 - K+ 3.8, SCr 0.58, TSH normal 01/2018 - LDL 54, LFT normal   The patient does not have symptoms concerning for COVID-19 infection (fever, chills, cough, or new shortness of breath).    Past Medical History:  Diagnosis Date  . Abdominal pain, left lower quadrant 06/29/2015  . Benign neoplasm of sigmoid colon   . Bilateral numbness of feet 06/29/2015  . Chronic left shoulder pain 05/17/2017  . Decreased sex drive 10/16/2776  . Dyspnea on exertion 04/18/2016  . Elevated glucose 05/18/2015  . Elevated LDL cholesterol level 06/29/2015  . Hand pain 01/01/2015  . History of blood transfusion   . Hypercholesteremia   . Hypertension   . Hyperthyroidism    a. s/p RAI  . Lacunar infarction ([place])    a. MRI brain 6/19: remote lacunar infarcts involving the pons  . Left ear pain 05/18/2015  . Left hip pain 01/01/2018  . Leg pain 04/16/2015  . Lipoma 04/16/2015  . PFO (patent foramen ovale)    a. TTE 6/19: EF 60-65%, no RWMA, Gr1DD, mild MR, RVSF normal, patent foramen ovale was noted with a positive saline contrast  study, PASP normal  . Prediabetes 05/18/2015  . Rib contusion, left, initial encounter 06/28/2016  . Sebaceous cyst of labia 12/05/2016  . Shoulder pain, bilateral 01/15/2015  . Special screening for malignant neoplasms, colon   . Statin intolerance 05/18/2015  . Subclinical hyperthyroidism 06/28/2016  . TIA (transient ischemic attack) 08/2017  . Tobacco abuse 05/18/2015  . URI (upper respiratory infection) 05/17/2017  . Vulvar lesion 10/31/2016  . Wears dentures    partial upper  . Weight loss 10/31/2016   Past Surgical History:  Procedure Laterality Date  . BREAST CYST ASPIRATION  2015  . COLONOSCOPY WITH PROPOFOL N/A 03/30/2015   Procedure: COLONOSCOPY WITH PROPOFOL;  Surgeon: Lucilla Lame, MD;  Location: [place];  Service: Endoscopy;  Laterality: N/A;  . DILATION AND CURETTAGE OF UTERUS    . POLYPECTOMY  03/30/2015   Procedure: POLYPECTOMY;  Surgeon: Lucilla Lame, MD;  Location: [place];  Service: Endoscopy;;     Current Meds  Medication Sig  . amLODipine (NORVASC) 10 MG tablet TAKE 1 TABLET (10 MG TOTAL) BY MOUTH DAILY.  Marland Kitchen aspirin EC 81 MG tablet Take 81 mg by mouth daily.  . clopidogrel (PLAVIX) 75 MG tablet Take 1 tablet (75 mg total) by mouth daily.  . fluticasone (FLONASE) 50 MCG/ACT nasal  spray Place 2 sprays into both nostrils daily.  . Multiple Vitamin (MULTIVITAMIN WITH MINERALS) TABS tablet Take 1 tablet by mouth daily.  . rosuvastatin (CRESTOR) 40 MG tablet Take 1 tablet (40 mg total) by mouth daily.     Allergies:   Patient has no known allergies.   Social History   Tobacco Use  . Smoking status: Former Smoker    Packs/day: 0.75    Years: 30.00    Pack years: 22.50    Types: Cigarettes    Quit date: 08/2017    Years since quitting: 1.0  . Smokeless tobacco: Never Used  Substance Use Topics  . Alcohol use: Not Currently    Alcohol/week: 0.0 standard drinks    Frequency: Never  . Drug use: No     Family Hx: The patient's family history  includes Cancer in her father; Drug abuse in her paternal grandmother; Hypertension in her brother and mother; Kidney disease in her brother. There is no history of Breast cancer or Thyroid disease.  ROS:   Please see the history of present illness.     All other systems reviewed and are negative.   Prior CV studies:   The following studies were reviewed today:  2D Echo 08/2017: - Left ventricle: The cavity size was normal. Systolic function was   normal. The estimated ejection fraction was in the range of 60%   to 65%. Wall motion was normal; there were no regional wall   motion abnormalities. Doppler parameters are consistent with   abnormal left ventricular relaxation (grade 1 diastolic   dysfunction). - Mitral valve: There was mild regurgitation. - Right ventricle: Systolic function was normal. - Atrial septum: There was a patent foramen ovale. Positive saline   contrast study - Pulmonary arteries: Systolic pressure was within the normal   range.  Labs/Other Tests and Data Reviewed:    EKG:  No ECG reviewed.  Recent Labs: 02/01/2018: ALT 16 07/31/2018: BUN 14; Creatinine, Ser 0.58; Potassium 3.8; Sodium 139; TSH 0.57 08/30/2018: Hemoglobin 11.3; Platelets 274.0   Recent Lipid Panel Lab Results  Component Value Date/Time   CHOL 182 12/23/2017 05:16 AM   TRIG 45 12/23/2017 05:16 AM   HDL 80 12/23/2017 05:16 AM   CHOLHDL 2.3 12/23/2017 05:16 AM   LDLCALC 93 12/23/2017 05:16 AM   LDLDIRECT 54.0 02/01/2018 09:11 AM    Wt Readings from Last 3 Encounters:  08/31/18 175 lb (79.4 kg)  05/04/18 171 lb 9.6 oz (77.8 kg)  03/26/18 168 lb (76.2 kg)     Objective:    Vital Signs:  BP 127/70   Ht 5\' 5"  (1.651 m)   Wt 175 lb (79.4 kg)   BMI 29.12 kg/m    VITAL SIGNS:  reviewed GEN:  no acute distress EYES:  sclerae anicteric, EOMI - Extraocular Movements Intact  ASSESSMENT & PLAN:    1. PFO: Evaluated by the structural heart team with no recommendation for  intervention at this time.  Continue to monitor.  2. Lacunar infarct/TIA: Followed by neurology.  Remains on aspirin and Plavix per neurology.  Continue aggressive risk factor modification as outlined below.  No residual deficits.  3. Mitral regurgitation: Mild by echocardiogram in 08/2017.  Asymptomatic.  Schedule echocardiogram.  4. Hypertension: Blood pressure is well controlled today.  I suspect some of her lower extremity swelling is venous insufficiency exacerbated by calcium channel blocker usage.  In this setting, we will decrease her amlodipine to 5 mg daily.  We will compensate for  this by addition of chlorthalidone 12.5 mg daily.  She will need a follow-up BMP in 2 weeks to assess renal function on chlorthalidone.  If she demonstrates a bump in renal function with addition of thiazide diuretic we will need to consider alternative antihypertensive therapy.  5. Hyperlipidemia: Most recent LDL at goal as outlined above.  Continue Crestor.  6. Lower extremity swelling: Likely in the setting of venous insufficiency exacerbated by calcium channel blocker usage as above.  Decrease amlodipine as above.  Elevate legs.  Compression stockings.  7. Prior tobacco abuse: She continues to abstain from smoking.  COVID-19 Education: The signs and symptoms of COVID-19 were discussed with the patient and how to seek care for testing (follow up with PCP or arrange E-visit).  The importance of social distancing was discussed today.  Time:   Today, I have spent 10 minutes with the patient with telehealth technology discussing the above problems.     Medication Adjustments/Labs and Tests Ordered: Current medicines are reviewed at length with the patient today.  Concerns regarding medicines are outlined above.   Tests Ordered: No orders of the defined types were placed in this encounter.   Medication Changes: No orders of the defined types were placed in this encounter.   Follow Up:  In Person in 6  month(s)  Signed, Christell Faith, PA-C  08/31/2018 10:21 AM    [place]

## 2018-08-28 ENCOUNTER — Other Ambulatory Visit: Payer: 59

## 2018-08-30 ENCOUNTER — Other Ambulatory Visit (INDEPENDENT_AMBULATORY_CARE_PROVIDER_SITE_OTHER): Payer: 59

## 2018-08-30 ENCOUNTER — Other Ambulatory Visit: Payer: Self-pay

## 2018-08-30 DIAGNOSIS — D649 Anemia, unspecified: Secondary | ICD-10-CM | POA: Diagnosis not present

## 2018-08-30 LAB — CBC
HCT: 34.5 % — ABNORMAL LOW (ref 36.0–46.0)
Hemoglobin: 11.3 g/dL — ABNORMAL LOW (ref 12.0–15.0)
MCHC: 32.8 g/dL (ref 30.0–36.0)
MCV: 84.2 fl (ref 78.0–100.0)
Platelets: 274 10*3/uL (ref 150.0–400.0)
RBC: 4.1 Mil/uL (ref 3.87–5.11)
RDW: 14.6 % (ref 11.5–15.5)
WBC: 5 10*3/uL (ref 4.0–10.5)

## 2018-08-30 LAB — VITAMIN B12: Vitamin B-12: 358 pg/mL (ref 211–911)

## 2018-08-30 LAB — FOLATE: Folate: >23.9 ng/mL (ref >5.9)

## 2018-08-31 ENCOUNTER — Telehealth (INDEPENDENT_AMBULATORY_CARE_PROVIDER_SITE_OTHER): Payer: 59 | Admitting: Physician Assistant

## 2018-08-31 VITALS — BP 127/70 | Ht 65.0 in | Wt 175.0 lb

## 2018-08-31 DIAGNOSIS — I340 Nonrheumatic mitral (valve) insufficiency: Secondary | ICD-10-CM

## 2018-08-31 DIAGNOSIS — Z87891 Personal history of nicotine dependence: Secondary | ICD-10-CM

## 2018-08-31 DIAGNOSIS — Q211 Atrial septal defect: Secondary | ICD-10-CM

## 2018-08-31 DIAGNOSIS — G459 Transient cerebral ischemic attack, unspecified: Secondary | ICD-10-CM

## 2018-08-31 DIAGNOSIS — I6381 Other cerebral infarction due to occlusion or stenosis of small artery: Secondary | ICD-10-CM | POA: Diagnosis not present

## 2018-08-31 DIAGNOSIS — I10 Essential (primary) hypertension: Secondary | ICD-10-CM

## 2018-08-31 DIAGNOSIS — M7989 Other specified soft tissue disorders: Secondary | ICD-10-CM

## 2018-08-31 DIAGNOSIS — E785 Hyperlipidemia, unspecified: Secondary | ICD-10-CM

## 2018-08-31 DIAGNOSIS — Q2112 Patent foramen ovale: Secondary | ICD-10-CM

## 2018-08-31 LAB — IRON,TIBC AND FERRITIN PANEL
%SAT: 18 % (calc) (ref 16–45)
Ferritin: 30 ng/mL (ref 16–232)
Iron: 64 ug/dL (ref 45–160)
TIBC: 353 mcg/dL (calc) (ref 250–450)

## 2018-08-31 MED ORDER — AMLODIPINE BESYLATE 5 MG PO TABS: 5.0000 mg | ORAL_TABLET | Freq: Every day | ORAL | 3 refills | Status: DC

## 2018-08-31 MED ORDER — CHLORTHALIDONE 25 MG PO TABS: 12.5000 mg | ORAL_TABLET | Freq: Every day | ORAL | 3 refills | Status: DC

## 2018-08-31 NOTE — Patient Instructions (Signed)
It was a pleasure to speak with you on the phone today! Thank you for allowing [place] to continue taking care of your [facility] needs during this time.   Feel free to call as needed for questions and concerns related to your cardiac needs.   Medication Instructions:  Your physician has recommended you make the following change in your medication:  1- DECREASE Amlodipine to Take 1 tablet (5 mg total) by mouth daily. 2- START Chlorthalidone Take 0.5 tablets (12.5 mg total) by mouth daily If you need a refill on your cardiac medications before your next appointment, please call your pharmacy.   Lab work: Your physician recommends that you return for lab work in: 2 weeks at the medical mall.  No appt is needed. Hours are M-F 7AM- 6 PM.  If you have labs (blood work) drawn today and your tests are completely normal, you will receive your results only by: Marland Kitchen MyChart Message (if you have MyChart) OR . A paper copy in the mail If you have any lab test that is abnormal or we need to change your treatment, we will call you to review the results.  Testing/Procedures: 1- Echo  Please return to [facility] on ______________ at _______________ AM/PM for an Echocardiogram. Your physician has requested that you have an echocardiogram. Echocardiography is a painless test that uses sound waves to create images of your heart. It provides your doctor with information about the size and shape of your heart and how well your heart's chambers and valves are working. This procedure takes approximately one hour. There are no restrictions for this procedure. Please note; depending on visual quality an IV may need to be placed.    Follow-Up: At [facility], you and your health needs are our priority.  As part of our continuing mission to provide you with exceptional heart care, we have created designated Provider Care Teams.  These Care Teams include your primary Cardiologist (physician) and Advanced  Practice Providers (APPs -  Physician Assistants and Nurse Practitioners) who all work together to provide you with the care you need, when you need it. You will need a follow up appointment in 6 months.  Please call our office 2 months in advance to schedule this appointment.  You may see Ida Rogue, MD or Christell Faith, PA-C.

## 2018-09-02 ENCOUNTER — Other Ambulatory Visit: Payer: Self-pay | Admitting: Family Medicine

## 2018-09-02 DIAGNOSIS — D649 Anemia, unspecified: Secondary | ICD-10-CM

## 2018-09-03 ENCOUNTER — Ambulatory Visit (INDEPENDENT_AMBULATORY_CARE_PROVIDER_SITE_OTHER): Payer: 59 | Admitting: Family Medicine

## 2018-09-03 ENCOUNTER — Encounter: Payer: Self-pay | Admitting: Family Medicine

## 2018-09-03 ENCOUNTER — Other Ambulatory Visit: Payer: Self-pay

## 2018-09-03 DIAGNOSIS — E0590 Thyrotoxicosis, unspecified without thyrotoxic crisis or storm: Secondary | ICD-10-CM | POA: Diagnosis not present

## 2018-09-03 DIAGNOSIS — E785 Hyperlipidemia, unspecified: Secondary | ICD-10-CM | POA: Diagnosis not present

## 2018-09-03 DIAGNOSIS — D649 Anemia, unspecified: Secondary | ICD-10-CM | POA: Insufficient documentation

## 2018-09-03 DIAGNOSIS — R42 Dizziness and giddiness: Secondary | ICD-10-CM | POA: Diagnosis not present

## 2018-09-03 DIAGNOSIS — I10 Essential (primary) hypertension: Secondary | ICD-10-CM | POA: Diagnosis not present

## 2018-09-03 DIAGNOSIS — G459 Transient cerebral ischemic attack, unspecified: Secondary | ICD-10-CM

## 2018-09-03 MED ORDER — ROSUVASTATIN CALCIUM 40 MG PO TABS: 40.0000 mg | ORAL_TABLET | Freq: Every day | ORAL | 1 refills | Status: DC

## 2018-09-03 NOTE — Assessment & Plan Note (Signed)
She notes this resolved.

## 2018-09-03 NOTE — Assessment & Plan Note (Signed)
Undetermined cause.  Iron studies, B12, and folate normal.  We will recheck a CBC which she has for lab work done in 2 weeks.  If she remains anemic I discussed I would refer to hematology for evaluation.

## 2018-09-03 NOTE — Assessment & Plan Note (Signed)
She reports this is adequately controlled.  Amlodipine dose recently decreased due to pedal edema and patient was started on chlorthalidone.  She will continue with these medications.  She will have a BMP through cardiology in 2 weeks.  I discussed that she would go to the medical mall at the hospital and have this completed in 2 weeks.  Edema is likely related to amlodipine though if not improving would need to consider further evaluation.  She does have an echo scheduled through cardiology.

## 2018-09-03 NOTE — Assessment & Plan Note (Signed)
Refill Crestor.  Has been at goal.

## 2018-09-03 NOTE — Assessment & Plan Note (Signed)
Status post ablation.  She will continue to follow with endocrinology.

## 2018-09-03 NOTE — Progress Notes (Signed)
Virtual Visit via video Note  This visit type was conducted due to national recommendations for restrictions regarding the COVID-19 pandemic (e.g. social distancing).  This format is felt to be most appropriate for this patient at this time.  All issues noted in this document were discussed and addressed.  No physical exam was performed (except for noted visual exam findings with Video Visits).   I connected with Alyssa Matthews today at 10:30 AM EDT by a video enabled telemedicine application and verified that I am speaking with the correct person using two identifiers. Location patient: home Location provider: work  Persons participating in the virtual visit: patient, provider  I discussed the limitations, risks, security and privacy concerns of performing an evaluation and management service by telephone and the availability of in person appointments. I also discussed with the patient that there may be a patient responsible charge related to this service. The patient expressed understanding and agreed to proceed.   Reason for visit: follow-up  HPI: HYPERTENSION  Disease Monitoring  Home BP Monitoring good, unable to give numbers but reports it checks out good Chest pain- no    Dyspnea- no Medications  Compliance-  Taking amlodipine, chlorthalidone.  Edema- some at night after being on feet all day, she saw cardiology 3 days ago and they decreased her amlodipine dose.  She notes she has had this for a while.  No orthopnea or PND.  Hyperlipidemia: She ran out of Crestor.  She has no right upper quadrant pain or myalgias.  Hypothyroidism: Status post treatment.  Her thyroid labs have been normal on rechecks.  No heat or cold intolerance.  No skin changes.  No palpitations.  Anemia: Found on recent lab work.  She notes no rectal bleeding, melena, hematuria, or menstrual cycles.  Iron studies were in the normal range.  B12 and folate were normal as well.     ROS: See pertinent  positives and negatives per HPI.  Past Medical History:  Diagnosis Date  . Abdominal pain, left lower quadrant 06/29/2015  . Benign neoplasm of sigmoid colon   . Bilateral numbness of feet 06/29/2015  . Chronic left shoulder pain 05/17/2017  . Decreased sex drive 04/20/7822  . Dyspnea on exertion 04/18/2016  . Elevated glucose 05/18/2015  . Elevated LDL cholesterol level 06/29/2015  . Hand pain 01/01/2015  . History of blood transfusion   . Hypercholesteremia   . Hypertension   . Hyperthyroidism    a. s/p RAI  . Lacunar infarction ([place])    a. MRI brain 6/19: remote lacunar infarcts involving the pons  . Left ear pain 05/18/2015  . Left hip pain 01/01/2018  . Leg pain 04/16/2015  . Lipoma 04/16/2015  . PFO (patent foramen ovale)    a. TTE 6/19: EF 60-65%, no RWMA, Gr1DD, mild MR, RVSF normal, patent foramen ovale was noted with a positive saline contrast study, PASP normal  . Prediabetes 05/18/2015  . Rib contusion, left, initial encounter 06/28/2016  . Sebaceous cyst of labia 12/05/2016  . Shoulder pain, bilateral 01/15/2015  . Special screening for malignant neoplasms, colon   . Statin intolerance 05/18/2015  . Subclinical hyperthyroidism 06/28/2016  . TIA (transient ischemic attack) 08/2017  . Tobacco abuse 05/18/2015  . URI (upper respiratory infection) 05/17/2017  . Vulvar lesion 10/31/2016  . Wears dentures    partial upper  . Weight loss 10/31/2016    Past Surgical History:  Procedure Laterality Date  . BREAST CYST ASPIRATION  2015  . COLONOSCOPY  WITH PROPOFOL N/A 03/30/2015   Procedure: COLONOSCOPY WITH PROPOFOL;  Surgeon: Lucilla Lame, MD;  Location: [place];  Service: Endoscopy;  Laterality: N/A;  . DILATION AND CURETTAGE OF UTERUS    . POLYPECTOMY  03/30/2015   Procedure: POLYPECTOMY;  Surgeon: Lucilla Lame, MD;  Location: [place];  Service: Endoscopy;;    Family History  Problem Relation Age of Onset  . Hypertension Mother   . Kidney disease Brother   . Cancer  Father        In neck  . Drug abuse Paternal Grandmother   . Hypertension Brother   . Breast cancer Neg Hx   . Thyroid disease Neg Hx     SOCIAL HX: Former smoker   Current Outpatient Medications:  .  amLODipine (NORVASC) 5 MG tablet, Take 1 tablet (5 mg total) by mouth daily., Disp: 90 tablet, Rfl: 3 .  aspirin EC 81 MG tablet, Take 81 mg by mouth daily., Disp: , Rfl:  .  chlorthalidone (HYGROTON) 25 MG tablet, Take 0.5 tablets (12.5 mg total) by mouth daily., Disp: 45 tablet, Rfl: 3 .  clopidogrel (PLAVIX) 75 MG tablet, Take 1 tablet (75 mg total) by mouth daily., Disp: 90 tablet, Rfl: 1 .  Multiple Vitamin (MULTIVITAMIN WITH MINERALS) TABS tablet, Take 1 tablet by mouth daily., Disp: , Rfl:  .  rosuvastatin (CRESTOR) 40 MG tablet, Take 1 tablet (40 mg total) by mouth daily., Disp: 90 tablet, Rfl: 1 .  fluticasone (FLONASE) 50 MCG/ACT nasal spray, Place 2 sprays into both nostrils daily. (Patient not taking: Reported on 09/03/2018), Disp: 16 g, Rfl: 6  EXAM:  VITALS per patient if applicable: None.  GENERAL: alert, oriented, appears well and in no acute distress  HEENT: atraumatic, conjunttiva clear, no obvious abnormalities on inspection of external nose and ears  NECK: normal movements of the head and neck  LUNGS: on inspection no signs of respiratory distress, breathing rate appears normal, no obvious gross SOB, gasping or wheezing  CV: no obvious cyanosis  MS: moves all visible extremities without noticeable abnormality  PSYCH/NEURO: pleasant and cooperative, no obvious depression or anxiety, speech and thought processing grossly intact  ASSESSMENT AND PLAN:  Discussed the following assessment and plan:  Essential hypertension She reports this is adequately controlled.  Amlodipine dose recently decreased due to pedal edema and patient was started on chlorthalidone.  She will continue with these medications.  She will have a BMP through cardiology in 2 weeks.  I  discussed that she would [facility] the [facility] and have this completed in 2 weeks.  Edema is likely related to amlodipine though if not improving would need to consider further evaluation.  She does have an echo scheduled through cardiology.  Subclinical hyperthyroidism Status post ablation.  She will continue to follow with endocrinology.  Light headedness She notes this resolved.  Hyperlipidemia Refill Crestor.  Has been at goal.  Anemia Undetermined cause.  Iron studies, B12, and folate normal.  We will recheck a CBC which she has for lab work done in 2 weeks.  If she remains anemic I discussed I would refer to hematology for evaluation.    I discussed the assessment and treatment plan with the patient. The patient was provided an opportunity to ask questions and all were answered. The patient agreed with the plan and demonstrated an understanding of the instructions.   The patient was advised to call back or seek an in-person evaluation if the symptoms  worsen or if the condition fails to improve as anticipated.    Tommi Rumps, MD

## 2018-09-21 NOTE — Progress Notes (Signed)
Patient said that she is no longer experiencing lightheadness.

## 2018-09-24 ENCOUNTER — Telehealth: Payer: Self-pay

## 2018-09-24 NOTE — Telephone Encounter (Signed)

## 2018-09-25 ENCOUNTER — Ambulatory Visit (INDEPENDENT_AMBULATORY_CARE_PROVIDER_SITE_OTHER): Payer: 59

## 2018-09-25 ENCOUNTER — Other Ambulatory Visit: Payer: Self-pay

## 2018-09-25 ENCOUNTER — Other Ambulatory Visit: Payer: 59 | Admitting: *Deleted

## 2018-09-25 DIAGNOSIS — E785 Hyperlipidemia, unspecified: Secondary | ICD-10-CM

## 2018-09-25 DIAGNOSIS — I340 Nonrheumatic mitral (valve) insufficiency: Secondary | ICD-10-CM

## 2018-09-25 DIAGNOSIS — D649 Anemia, unspecified: Secondary | ICD-10-CM

## 2018-09-28 ENCOUNTER — Telehealth: Payer: Self-pay

## 2018-09-28 LAB — CBC
Hematocrit: 34.1 % (ref 34.0–46.6)
Hemoglobin: 10.9 g/dL — ABNORMAL LOW (ref 11.1–15.9)
MCH: 27.7 pg (ref 26.6–33.0)
MCHC: 32 g/dL (ref 31.5–35.7)
MCV: 87 fL (ref 79–97)
Platelets: 276 10*3/uL (ref 150–450)
RBC: 3.93 x10E6/uL (ref 3.77–5.28)
RDW: 13.9 % (ref 11.7–15.4)
WBC: 5.3 10*3/uL (ref 3.4–10.8)

## 2018-09-28 LAB — BASIC METABOLIC PANEL

## 2018-09-28 NOTE — Telephone Encounter (Addendum)
LMOVM for patient to call reference need to redraw BMP Specimen lost in transit per Tasha at [place]. Need to schedule a lab appointment at the patient's convenience.

## 2018-10-04 ENCOUNTER — Telehealth: Payer: Self-pay | Admitting: Family Medicine

## 2018-10-04 NOTE — Telephone Encounter (Signed)
Lmtcb.  Alyssa Matthews,cma

## 2018-10-04 NOTE — Telephone Encounter (Signed)
She could be tested tomorrow as that should be long enough from the time of exposure. If she would like to be tested I can place an order and we can advise her where to go for testing. She needs to quarantine as advised in the prior notes and if she is tested she will need to quarantine at least until the result comes back.

## 2018-10-04 NOTE — Telephone Encounter (Signed)
Called and spoke to pt.  Pt said that her brother had symptoms of fatigue and headaches and was tested for COVID.  Pt said she was around her brother on Sunday.  Pt said that she has been feeling tired and drained but attributes those symptoms to the heat/weather.  Pt denied having any other symptoms.  Pt advised to self-quarantine until her brother gets the results back.  Pt said that she had already called out of work today.  Pt wants to know if she should be tested since she is not having any symptoms right now herself.  Please also advise pt regarding returning to work.

## 2018-10-04 NOTE — Telephone Encounter (Signed)
alled and spoke to pt.  Pt said that her brother had symptoms of fatigue and headaches and was tested for COVID.  Pt said she was around her brother on Sunday.  Pt said that she has been feeling tired and drained but attributes those symptoms to the heat/weather.  Pt denied having any other symptoms.  Pt advised to self-quarantine until her brother gets the results back.  Pt said that she had already called out of work today.  Pt wants to know if she should be tested since she is not having any symptoms right now herself.  Please also advise pt regarding returning to work.

## 2018-10-04 NOTE — Telephone Encounter (Signed)
Copied from [place] [phone]. Topic: General - Other >> Oct 04, 2018 10:27 AM Nils Flack wrote: Reason for CRM: pt would like an order for covid test.  Her brother was sent to get a test.  Pt states she is drained and tired the past couple of days.   Call back is [phone] when orders are placed. Please call back to let  pt know if she should go to work or not.

## 2018-10-05 ENCOUNTER — Telehealth: Payer: Self-pay | Admitting: *Deleted

## 2018-10-05 DIAGNOSIS — Q211 Atrial septal defect: Secondary | ICD-10-CM

## 2018-10-05 DIAGNOSIS — I059 Rheumatic mitral valve disease, unspecified: Secondary | ICD-10-CM

## 2018-10-05 DIAGNOSIS — I10 Essential (primary) hypertension: Secondary | ICD-10-CM

## 2018-10-05 DIAGNOSIS — Z79899 Other long term (current) drug therapy: Secondary | ICD-10-CM

## 2018-10-05 DIAGNOSIS — Q2112 Patent foramen ovale: Secondary | ICD-10-CM

## 2018-10-05 NOTE — Telephone Encounter (Signed)
Spoke with patient.  She notes her brother's test came back negative.  She has not had any symptoms.  She notes that he had headache and some tiredness and they thought it was because he had worked out in the yard.  She was last exposed to him on Sunday.  I advised that typically anybody that is being tested for COVID-19 should be under quarantine even if they do not have symptoms.  I advised that I would like for her to stay home from work and remain under strict quarantine at home until her test result returns.  She wanted to check with her work as they have allowed her to continue to work through their protocol.  I advised her to quarantine through the weekend and to call her work on Monday and then let us know if she needs a work note.  I advised that my recommendation was for her to quarantine at home until her COVID-19 test returned.

## 2018-10-05 NOTE — Telephone Encounter (Signed)
Noted. I recommend that anyone who has been tested for COVID19 be under quarantine at least until their results return even if they have no symptoms. If she needs a work note I am happy to provide one for her.

## 2018-10-05 NOTE — Telephone Encounter (Signed)
Patient stated she went to work because her job told her she could because she does not and has not had symptoms, her brother was tested and his result will not be back until Monday, patient stated [facility] and got her cov[facility] testing yesterday and she is waiting for results. Gokul Waybright,cma

## 2018-10-05 NOTE — Telephone Encounter (Signed)
Results called to pt. Pt verbalized understanding. Patient is also aware to go to [facility] for repeat lab work since her BMET did not result.  She will try to go tomorrow or go on Monday.  Echo and lab orders entered.

## 2018-10-05 NOTE — Telephone Encounter (Signed)
-----   Message from Rise Mu, PA-C sent at 09/26/2018  7:17 AM EDT ----- Echo showed normal pump function, slightly stiffened heart, normal wall motion, tricuspid aortic valve with mild thickening without evidence of narrowing, normal in size and structure aortic root and ascending aorta. Interatrial shunt/PFO was again noted, stable trivially leaky mitral valve.    No further workup of PFO is needed at this time, as recommended by the structural heart team.  We can follow up with her leaky mitral valve with an echo in ~ 12 months.

## 2018-10-06 ENCOUNTER — Other Ambulatory Visit: Payer: Self-pay | Admitting: Family Medicine

## 2018-10-08 ENCOUNTER — Other Ambulatory Visit
Admission: RE | Admit: 2018-10-08 | Discharge: 2018-10-08 | Disposition: A | Discharge status: Home / Self Care | Payer: 59 | Source: Ambulatory Visit | Attending: Physician Assistant | Admitting: Physician Assistant

## 2018-10-08 ENCOUNTER — Telehealth: Payer: Self-pay | Admitting: *Deleted

## 2018-10-08 DIAGNOSIS — Z79899 Other long term (current) drug therapy: Secondary | ICD-10-CM

## 2018-10-08 DIAGNOSIS — I10 Essential (primary) hypertension: Secondary | ICD-10-CM

## 2018-10-08 LAB — BASIC METABOLIC PANEL
Anion gap: 10 (ref 5–15)
BUN: 14 mg/dL (ref 6–20)
CO2: 26 mmol/L (ref 22–32)
Calcium: 9.2 mg/dL (ref 8.9–10.3)
Chloride: 104 mmol/L (ref 98–111)
Creatinine, Ser: 0.61 mg/dL (ref 0.44–1.00)
GFR calc Af Amer: >60 mL/min (ref >60)
GFR calc non Af Amer: >60 mL/min (ref >60)
Glucose, Bld: 119 mg/dL — ABNORMAL HIGH (ref 70–99)
Potassium: 3.5 mmol/L (ref 3.5–5.1)
Sodium: 140 mmol/L (ref 135–145)

## 2018-10-08 MED ORDER — POTASSIUM CHLORIDE ER 10 MEQ PO TBCR: 10.0000 meq | EXTENDED_RELEASE_TABLET | Freq: Every day | ORAL | 3 refills | Status: DC

## 2018-10-08 NOTE — Telephone Encounter (Signed)
Results called to pt. Pt verbalized understanding of results, to start KCL 10 mEq once a day, and to [facility] for repeat lab work. Rx sent to pharmacy and lab order entered.

## 2018-10-08 NOTE — Telephone Encounter (Signed)
-----   Message from Rise Mu, PA-C sent at 10/08/2018  9:37 AM EDT ----- Labs showed normal renal function and stable, though slightly low potassium.  Please have patient start KCl 10 mEq daily.  Continue current dose of chlorthalidone.  Follow-up BMP in 2 weeks to assess for stable potassium.

## 2018-11-16 ENCOUNTER — Telehealth: Payer: Self-pay | Admitting: Family Medicine

## 2018-11-16 MED ORDER — CHLORTHALIDONE 25 MG PO TABS: 12.5000 mg | ORAL_TABLET | Freq: Every day | ORAL | 3 refills | Status: DC

## 2018-11-16 NOTE — Telephone Encounter (Signed)
RX REFILL Chlorthalidone (HYGRTON) 25 MG PHARMACY [id number] - [place], [place] [phone] (Phone) [phone] (Fax

## 2018-11-16 NOTE — Telephone Encounter (Signed)
This Rx was prescribed by a historical provider.   

## 2018-11-16 NOTE — Telephone Encounter (Signed)
Refill sent to pharmacy.   

## 2018-11-18 ENCOUNTER — Other Ambulatory Visit: Payer: Self-pay | Admitting: Family Medicine

## 2018-11-18 DIAGNOSIS — E785 Hyperlipidemia, unspecified: Secondary | ICD-10-CM

## 2018-11-18 DIAGNOSIS — G459 Transient cerebral ischemic attack, unspecified: Secondary | ICD-10-CM

## 2018-12-25 ENCOUNTER — Other Ambulatory Visit: Payer: Self-pay | Admitting: Family Medicine

## 2018-12-25 DIAGNOSIS — G459 Transient cerebral ischemic attack, unspecified: Secondary | ICD-10-CM

## 2018-12-25 DIAGNOSIS — E785 Hyperlipidemia, unspecified: Secondary | ICD-10-CM

## 2019-01-04 ENCOUNTER — Other Ambulatory Visit: Payer: Self-pay

## 2019-01-08 ENCOUNTER — Encounter: Payer: Self-pay | Admitting: Family Medicine

## 2019-01-08 ENCOUNTER — Other Ambulatory Visit: Payer: Self-pay

## 2019-01-08 ENCOUNTER — Ambulatory Visit (INDEPENDENT_AMBULATORY_CARE_PROVIDER_SITE_OTHER): Payer: 59 | Admitting: Family Medicine

## 2019-01-08 VITALS — BP 130/80 | HR 63 | Temp 97.1°F | Ht 65.0 in | Wt 176.2 lb

## 2019-01-08 DIAGNOSIS — Z23 Encounter for immunization: Secondary | ICD-10-CM

## 2019-01-08 DIAGNOSIS — R252 Cramp and spasm: Secondary | ICD-10-CM

## 2019-01-08 DIAGNOSIS — I10 Essential (primary) hypertension: Secondary | ICD-10-CM | POA: Diagnosis not present

## 2019-01-08 DIAGNOSIS — G459 Transient cerebral ischemic attack, unspecified: Secondary | ICD-10-CM

## 2019-01-08 DIAGNOSIS — M79671 Pain in right foot: Secondary | ICD-10-CM | POA: Diagnosis not present

## 2019-01-08 DIAGNOSIS — E663 Overweight: Secondary | ICD-10-CM

## 2019-01-08 DIAGNOSIS — E7800 Pure hypercholesterolemia, unspecified: Secondary | ICD-10-CM | POA: Diagnosis not present

## 2019-01-08 LAB — LIPID PANEL
Cholesterol: 141 mg/dL (ref 0–200)
HDL: 64.9 mg/dL (ref >39.00)
LDL Cholesterol: 58 mg/dL (ref 0–99)
NonHDL: 75.74
Total CHOL/HDL Ratio: 2
Triglycerides: 91 mg/dL (ref 0.0–149.0)
VLDL: 18.2 mg/dL (ref 0.0–40.0)

## 2019-01-08 LAB — COMPREHENSIVE METABOLIC PANEL
ALT: 15 U/L (ref 0–35)
AST: 16 U/L (ref 0–37)
Albumin: 4.3 g/dL (ref 3.5–5.2)
Alkaline Phosphatase: 81 U/L (ref 39–117)
BUN: 14 mg/dL (ref 6–23)
CO2: 31 mEq/L (ref 19–32)
Calcium: 9.4 mg/dL (ref 8.4–10.5)
Chloride: 102 mEq/L (ref 96–112)
Creatinine, Ser: 0.55 mg/dL (ref 0.40–1.20)
GFR: 138.21 mL/min (ref >60.00)
Glucose, Bld: 98 mg/dL (ref 70–99)
Potassium: 3.4 mEq/L — ABNORMAL LOW (ref 3.5–5.1)
Sodium: 141 mEq/L (ref 135–145)
Total Bilirubin: 0.4 mg/dL (ref 0.2–1.2)
Total Protein: 6.8 g/dL (ref 6.0–8.3)

## 2019-01-08 LAB — MAGNESIUM: Magnesium: 2 mg/dL (ref 1.5–2.5)

## 2019-01-08 MED ORDER — CHLORTHALIDONE 25 MG PO TABS: 12.5000 mg | ORAL_TABLET | Freq: Every day | ORAL | 3 refills | Status: DC

## 2019-01-08 NOTE — Patient Instructions (Signed)
Nice to see you. We will get lab work and contact you with the results. If you want to see a podiatrist please let us know. Please work on exercise as we discussed.

## 2019-01-08 NOTE — Assessment & Plan Note (Signed)
Adequately controlled.  Continue current regimen.  Check lab work. 

## 2019-01-08 NOTE — Assessment & Plan Note (Signed)
Check lipid panel.  Continue Crestor.

## 2019-01-08 NOTE — Assessment & Plan Note (Signed)
Could be related to cramping.  We will check lab work for that.  If negative for any cause consider podiatry referral.  Discussed seeing podiatry for the onychomycosis as well.

## 2019-01-08 NOTE — Assessment & Plan Note (Signed)
Discussed adding in exercise 3 days a week for 30 minutes at a time.  Discussed continuing with cutting back on fried fatty foods and sodas.

## 2019-01-08 NOTE — Progress Notes (Signed)
Tommi Rumps, MD Phone: [phone]  Alyssa Matthews is a 58 y.o. female who presents today for f/u.  HYPERTENSION  Disease Monitoring  Home BP Monitoring 127/70 on last check Chest pain- no    Dyspnea- no Medications  Compliance-  Taking amlodipine, chlorthalidone.   Edema- no  HYPERLIPIDEMIA Symptoms Chest pain on exertion:  no   Medications: Compliance- taking crestor Right upper quadrant pain- no  Muscle aches- no  Right foot pain: Patient notes this has been going on for a number of months now.  Hurts on the top of her foot.  No specific movements bring it on.  It feels like a cramp.  No injuries.  Overweight: Patient notes she has gained some weight recently.  She has not been exercising.  She was eating a lot of [place] fries.  She has cut down on that and cut down on soda and sweet tea.  She is mostly drinking water.  Eating chicken and vegetables.  Try and eat more salads.  History of TIA: No numbness or weakness.  She continues on plan.      Social History   Tobacco Use  Smoking Status Former Smoker  . Packs/day: 0.75  . Years: 30.00  . Pack years: 22.50  . Types: Cigarettes  . Quit date: 08/2017  . Years since quitting: 1.4  Smokeless Tobacco Never Used     ROS see history of present illness  Objective  Physical Exam Vitals:   01/08/19 1039  BP: 130/80  Pulse: 63  Temp: (!) 97.1 F (36.2 C)  SpO2: 99%    BP Readings from Last 3 Encounters:  01/08/19 130/80  08/31/18 127/70  05/04/18 120/70   Wt Readings from Last 3 Encounters:  01/08/19 176 lb 3.2 oz (79.9 kg)  08/31/18 175 lb (79.4 kg)  05/04/18 171 lb 9.6 oz (77.8 kg)    Physical Exam Constitutional:      General: She is not in acute distress.    Appearance: She is not diaphoretic.  Cardiovascular:     Rate and Rhythm: Normal rate and regular rhythm.     Heart sounds: Normal heart sounds.  Pulmonary:     Effort: Pulmonary effort is normal.     Breath sounds: Normal breath  sounds.  Musculoskeletal:     Right lower leg: No edema.     Left lower leg: No edema.     Comments: Right foot with no tenderness over the dorsum, good range of motion right great toe, she does have onychomycosis on the right great toe, no swelling or erythema of the right foot, 2+ DP pulse of the right foot  Skin:    General: Skin is warm and dry.  Neurological:     Mental Status: She is alert.      Assessment/Plan: Please see individual problem list.  Essential hypertension Adequately controlled.  Continue current regimen.  Check lab work.  TIA (transient ischemic attack) No recurrence of symptoms.  Continue Plavix.  Right foot pain Could be related to cramping.  We will check lab work for that.  If negative for any cause consider podiatry referral.  Discussed seeing podiatry for the onychomycosis as well.  Elevated LDL cholesterol level Check lipid panel.  Continue Crestor.  Overweight Discussed adding in exercise 3 days a week for 30 minutes at a time.  Discussed continuing with cutting back on fried fatty foods and sodas.   Orders Placed This Encounter  Procedures  . Flu Vaccine QUAD 36+ mos  IM  . Comp Met (CMET)  . Magnesium  . Lipid panel    Meds ordered this encounter  Medications  . chlorthalidone (HYGROTON) 25 MG tablet    Sig: Take 0.5 tablets (12.5 mg total) by mouth daily.    Dispense:  45 tablet    Refill:  [place], MD [place]

## 2019-01-08 NOTE — Assessment & Plan Note (Signed)
No recurrence of symptoms.  Continue Plavix.

## 2019-01-12 ENCOUNTER — Other Ambulatory Visit: Payer: Self-pay | Admitting: Family Medicine

## 2019-01-12 DIAGNOSIS — M79671 Pain in right foot: Secondary | ICD-10-CM

## 2019-01-22 ENCOUNTER — Other Ambulatory Visit: Payer: Self-pay | Admitting: Family Medicine

## 2019-01-22 DIAGNOSIS — G459 Transient cerebral ischemic attack, unspecified: Secondary | ICD-10-CM

## 2019-01-22 DIAGNOSIS — E785 Hyperlipidemia, unspecified: Secondary | ICD-10-CM

## 2019-01-27 ENCOUNTER — Other Ambulatory Visit: Payer: Self-pay

## 2019-01-27 ENCOUNTER — Emergency Department: Payer: 59

## 2019-01-27 ENCOUNTER — Emergency Department
Admission: EM | Admit: 2019-01-27 | Discharge: 2019-01-28 | Disposition: A | Discharge status: Home / Self Care | Payer: 59 | Attending: Emergency Medicine | Admitting: Emergency Medicine

## 2019-01-27 DIAGNOSIS — R42 Dizziness and giddiness: Secondary | ICD-10-CM | POA: Diagnosis present

## 2019-01-27 DIAGNOSIS — Z79899 Other long term (current) drug therapy: Secondary | ICD-10-CM | POA: Diagnosis not present

## 2019-01-27 DIAGNOSIS — Z87891 Personal history of nicotine dependence: Secondary | ICD-10-CM | POA: Insufficient documentation

## 2019-01-27 DIAGNOSIS — Z7982 Long term (current) use of aspirin: Secondary | ICD-10-CM | POA: Diagnosis not present

## 2019-01-27 DIAGNOSIS — I10 Essential (primary) hypertension: Secondary | ICD-10-CM | POA: Diagnosis not present

## 2019-01-27 DIAGNOSIS — H538 Other visual disturbances: Secondary | ICD-10-CM | POA: Diagnosis not present

## 2019-01-27 DIAGNOSIS — Z8673 Personal history of transient ischemic attack (TIA), and cerebral infarction without residual deficits: Secondary | ICD-10-CM | POA: Diagnosis not present

## 2019-01-27 LAB — BASIC METABOLIC PANEL
Anion gap: 8 (ref 5–15)
BUN: 14 mg/dL (ref 6–20)
CO2: 24 mmol/L (ref 22–32)
Calcium: 9 mg/dL (ref 8.9–10.3)
Chloride: 111 mmol/L (ref 98–111)
Creatinine, Ser: 0.83 mg/dL (ref 0.44–1.00)
GFR calc Af Amer: >60 mL/min (ref >60)
GFR calc non Af Amer: >60 mL/min (ref >60)
Glucose, Bld: 128 mg/dL — ABNORMAL HIGH (ref 70–99)
Potassium: 3.5 mmol/L (ref 3.5–5.1)
Sodium: 143 mmol/L (ref 135–145)

## 2019-01-27 LAB — CBC
HCT: 33.6 % — ABNORMAL LOW (ref 36.0–46.0)
Hemoglobin: 10.8 g/dL — ABNORMAL LOW (ref 12.0–15.0)
MCH: 27.5 pg (ref 26.0–34.0)
MCHC: 32.1 g/dL (ref 30.0–36.0)
MCV: 85.5 fL (ref 80.0–100.0)
Platelets: 286 10*3/uL (ref 150–400)
RBC: 3.93 MIL/uL (ref 3.87–5.11)
RDW: 15.2 % (ref 11.5–15.5)
WBC: 5.9 10*3/uL (ref 4.0–10.5)
nRBC: 0 % (ref 0.0–0.2)

## 2019-01-27 LAB — URINALYSIS, COMPLETE (UACMP) WITH MICROSCOPIC
Bacteria, UA: NONE SEEN
Bilirubin Urine: NEGATIVE
Glucose, UA: 150 mg/dL — AB
Hgb urine dipstick: NEGATIVE
Ketones, ur: NEGATIVE mg/dL
Leukocytes,Ua: NEGATIVE
Nitrite: NEGATIVE
Protein, ur: NEGATIVE mg/dL
Specific Gravity, Urine: 1.021 (ref 1.005–1.030)
pH: 5 (ref 5.0–8.0)

## 2019-01-27 NOTE — ED Triage Notes (Signed)
Pt states today she has been lightheaded. States became worse about 20 minutes ago. Has eaten today. States laying down doesn't help. Blurred vision earlier. No numbness/tingling anywhere.   A&O, in wheelchair, speaking in complete sentences, moving all extremities on own.

## 2019-01-27 NOTE — ED Notes (Signed)
Pt waiting patiently for treatment room

## 2019-01-27 NOTE — ED Provider Notes (Signed)
[facility] Emergency Department Provider Note   ____________________________________________   First MD Initiated Contact with Patient 01/27/19 2257     (approximate)  I have reviewed the triage vital signs and the nursing notes.   HISTORY  Chief Complaint Dizziness    HPI Alyssa Matthews is a 58 y.o. female who presents to the ED from home with a chief complaint of dizziness. Reports dizziness x 3 days. Ran out of Coreg 6 days ago. Reports intermittent episodes which she describes as vertigo and sometimes lightheadedness associated with blurry vision.  Denies slurred speech, confusion, extremity weakness, numbness or tingling.  Denies fever, cough, chest pain, shortness of breath, abdominal pain, nausea, vomiting, diarrhea.  Recent travel or trauma.       Past Medical History:  Diagnosis Date   Abdominal pain, left lower quadrant 06/29/2015   Benign neoplasm of sigmoid colon    Bilateral numbness of feet 06/29/2015   Chronic left shoulder pain 05/17/2017   Decreased sex drive [id number]   Dyspnea on exertion 04/18/2016   Elevated glucose 05/18/2015   Elevated LDL cholesterol level 06/29/2015   Hand pain 01/01/2015   History of blood transfusion    Hypercholesteremia    Hypertension    Hyperthyroidism    a. s/p RAI   Lacunar infarction ([place])    a. MRI brain 6/19: remote lacunar infarcts involving the pons   Left ear pain 05/18/2015   Left hip pain 01/01/2018   Leg pain 04/16/2015   Lipoma 04/16/2015   PFO (patent foramen ovale)    a. TTE 6/19: EF 60-65%, no RWMA, Gr1DD, mild MR, RVSF normal, patent foramen ovale was noted with a positive saline contrast study, PASP normal   Prediabetes 05/18/2015   Rib contusion, left, initial encounter 06/28/2016   Sebaceous cyst of labia 12/05/2016   Shoulder pain, bilateral 01/15/2015   Special screening for malignant neoplasms, colon    Statin intolerance 05/18/2015   Subclinical hyperthyroidism  06/28/2016   TIA (transient ischemic attack) 08/2017   Tobacco abuse 05/18/2015   URI (upper respiratory infection) 05/17/2017   Vulvar lesion 10/31/2016   Wears dentures    partial upper   Weight loss 10/31/2016    Patient Active Problem List   Diagnosis Date Noted   Right foot pain 01/08/2019   Hyperlipidemia 09/03/2018   Anemia 09/03/2018   Light headedness 07/27/2018   Overweight 05/04/2018   Left hip pain 01/01/2018   PFO (patent foramen ovale) 09/15/2017   TIA (transient ischemic attack) 08/30/2017   Chronic left shoulder pain 05/17/2017   Sebaceous cyst of labia 12/05/2016   Vulvar lesion 10/31/2016   Subclinical hyperthyroidism 06/28/2016   Dyspnea on exertion 04/18/2016   Elevated LDL cholesterol level 06/29/2015   Bilateral numbness of feet 06/29/2015   Tobacco abuse 05/18/2015   Prediabetes 05/18/2015   Lipoma 04/16/2015   Benign neoplasm of sigmoid colon    Shoulder pain, bilateral 01/15/2015   Essential hypertension 01/01/2015    Past Surgical History:  Procedure Laterality Date   BREAST CYST ASPIRATION  2015   COLONOSCOPY WITH PROPOFOL N/A 03/30/2015   Procedure: COLONOSCOPY WITH PROPOFOL;  Surgeon: Lucilla Lame, MD;  Location: [place];  Service: Endoscopy;  Laterality: N/A;   DILATION AND CURETTAGE OF UTERUS     POLYPECTOMY  03/30/2015   Procedure: POLYPECTOMY;  Surgeon: Lucilla Lame, MD;  Location: [place];  Service: Endoscopy;;    Prior to Admission medications   Medication Sig Start Date End  Date Taking? Authorizing Provider  amLODipine (NORVASC) 10 MG tablet TAKE 1 TABLET BY MOUTH EVERY DAY 10/08/18   Leone Haven, MD  amLODipine (NORVASC) 5 MG tablet Take 1 tablet (5 mg total) by mouth daily. 08/31/18   Rise Mu, PA-C  aspirin EC 81 MG tablet Take 81 mg by mouth daily.    [provider]  chlorthalidone (HYGROTON) 25 MG tablet Take 0.5 tablets (12.5 mg total) by mouth daily. 01/08/19  04/08/19  Leone Haven, MD  clopidogrel (PLAVIX) 75 MG tablet TAKE 1 TABLET BY MOUTH EVERY DAY 11/21/18   Leone Haven, MD  fluticasone [facility]) 50 MCG/ACT nasal spray Place 2 sprays into both nostrils daily. 05/04/18   Leone Haven, MD  Multiple Vitamin (MULTIVITAMIN WITH MINERALS) TABS tablet Take 1 tablet by mouth daily.    [provider]  potassium chloride (K-DUR) 10 MEQ tablet Take 1 tablet (10 mEq total) by mouth daily. 10/08/18 01/06/19  Rise Mu, PA-C  rosuvastatin (CRESTOR) 40 MG tablet TAKE 1 TABLET BY MOUTH EVERY DAY 12/25/18   Leone Haven, MD    Allergies Patient has no known allergies.  Family History  Problem Relation Age of Onset   Hypertension Mother    Kidney disease Brother    Cancer Father        In neck   Drug abuse Paternal Grandmother    Hypertension Brother    Breast cancer Neg Hx    Thyroid disease Neg Hx     Social History Social History   Tobacco Use   Smoking status: Former Smoker    Packs/day: 0.75    Years: 30.00    Pack years: 22.50    Types: Cigarettes    Quit date: 08/2017    Years since quitting: 1.4   Smokeless tobacco: Never Used  Substance Use Topics   Alcohol use: Not Currently    Alcohol/week: 0.0 standard drinks    Frequency: Never   Drug use: No    Review of Systems  Constitutional: No fever/chills Eyes: No visual changes. ENT: No sore throat. Cardiovascular: Denies chest pain. Respiratory: Denies shortness of breath. Gastrointestinal: No abdominal pain.  No nausea, no vomiting.  No diarrhea.  No constipation. Genitourinary: Negative for dysuria. Musculoskeletal: Negative for back pain. Skin: Negative for rash. Neurological: Positive for dizziness.  Negative for headaches, focal weakness or numbness.   ____________________________________________   PHYSICAL EXAM:  VITAL SIGNS: ED Triage Vitals  Enc Vitals Group     BP 01/27/19 1814 130/69     Pulse Rate 01/27/19 1814  76     Resp --      Temp 01/27/19 1814 98.3 F (36.8 C)     Temp Source 01/27/19 1814 Oral     SpO2 01/27/19 1814 100 %     Weight 01/27/19 1815 176 lb (79.8 kg)     Height 01/27/19 1815 5' 5.5" (1.664 m)     Head Circumference --      Peak Flow --      Pain Score 01/27/19 1826 0     Pain Loc --      Pain Edu? --      Excl. in [place]? --     Constitutional: Alert and oriented. Well appearing and in no acute distress. Eyes: Conjunctivae are normal. PERRL. EOMI. Head: Atraumatic. Ears: Fluid behind left TM, otherwise unremarkable. Nose: No congestion/rhinnorhea. Mouth/Throat: Mucous membranes are moist.  Oropharynx non-erythematous. Neck: No stridor.  No carotid bruits.  Supple neck without meningismus. Cardiovascular: Normal rate, regular rhythm. Grossly normal heart sounds.  Good peripheral circulation. Respiratory: Normal respiratory effort.  No retractions. Lungs CTAB. Gastrointestinal: Soft and nontender. No distention. No abdominal bruits. No CVA tenderness. Musculoskeletal: No lower extremity tenderness nor edema.  No joint effusions. Neurologic: Alert and oriented x3.  CN II-XII grossly intact.  Normal speech and language. No gross focal neurologic deficits are appreciated. No gait instability. Skin:  Skin is warm, dry and intact. No rash noted. Psychiatric: Mood and affect are normal. Speech and behavior are normal.  ____________________________________________   LABS (all labs ordered are listed, but only abnormal results are displayed)  Labs Reviewed  BASIC METABOLIC PANEL - Abnormal; Notable for the following components:      Result Value   Glucose, Bld 128 (*)    All other components within normal limits  CBC - Abnormal; Notable for the following components:   Hemoglobin 10.8 (*)    HCT 33.6 (*)    All other components within normal limits  URINALYSIS, COMPLETE (UACMP) WITH MICROSCOPIC - Abnormal; Notable for the following components:   Color, Urine YELLOW (*)     APPearance CLEAR (*)    Glucose, UA 150 (*)    All other components within normal limits  CBG MONITORING, ED  TROPONIN I (HIGH SENSITIVITY)   ____________________________________________  EKG  ED ECG REPORT I, Ryden Wainer J, the attending physician, personally viewed and interpreted this ECG.   Date: 01/27/2019  EKG Time: 1859  Rate: 89  Rhythm: normal EKG, normal sinus rhythm  Axis: Normal  Intervals:none  ST&T Change: Nonspecific  ____________________________________________  RADIOLOGY  ED MD interpretation: Unremarkable CT head  Official radiology report(s): Ct Head Wo Contrast  Result Date: 01/27/2019 CLINICAL DATA:  Dizziness EXAM: CT HEAD WITHOUT CONTRAST TECHNIQUE: Contiguous axial images were obtained from the base of the skull through the vertex without intravenous contrast. COMPARISON:  12/22/2017 FINDINGS: Brain: No acute intracranial abnormality. Specifically, no hemorrhage, hydrocephalus, mass lesion, acute infarction, or significant intracranial injury. Vascular: No hyperdense vessel or unexpected calcification. Skull: No acute calvarial abnormality. Sinuses/Orbits: Visualized paranasal sinuses and mastoids clear. Orbital soft tissues unremarkable. Other: None IMPRESSION: Normal study. Electronically Signed   By: Rolm Baptise M.D.   On: 01/27/2019 23:25    ____________________________________________   PROCEDURES  Procedure(s) performed (including Critical Care):  Procedures   ____________________________________________   INITIAL IMPRESSION / ASSESSMENT AND PLAN / ED COURSE  As part of my medical decision making, I reviewed the following data within the [place] History obtained from family, Nursing notes reviewed and incorporated, Labs reviewed, EKG interpreted, Old chart reviewed, Radiograph reviewed and Notes from prior ED visits     BLAKELIE UEHARA was evaluated in Emergency Department on 01/28/2019 for the symptoms described in  the history of present illness. She was evaluated in the context of the global COVID-19 pandemic, which necessitated consideration that the patient might be at risk for infection with the SARS-CoV-2 virus that causes COVID-19. Institutional protocols and algorithms that pertain to the evaluation of patients at risk for COVID-19 are in a state of rapid change based on information released by regulatory bodies including the CDC and federal and state organizations. These policies and algorithms were followed during the patient's care in the ED.    58 year old female who presents with a several day history of dizziness.  Differential diagnosis includes but is not limited to CVA, TIA, ACS, or any other medication, infectious, metabolic etiologies, etc.  Orthostatics unremarkable.  Lab work and urine largely unremarkable.  Will check troponin, CT head.  Currently patient feels back to baseline.  Getting her Coreg refilled tomorrow.  Clinical Course as of Jan 28 24  Mon Jan 28, 2019  [id number] Troponin unremarkable.  Do not feel repeat troponin necessary as symptoms have been ongoing for several days.  Will discharge home with a Coreg tablet until patient can pick up her prescription in the morning.  Strict return precautions given.  Patient and spouse verbalized understanding and agree with plan of care.   [JS]    Clinical Course User Index [JS] Paulette Blanch, MD     ____________________________________________   FINAL CLINICAL IMPRESSION(S) / ED DIAGNOSES  Final diagnoses:  Dizziness     ED Discharge Orders    None       Note:  This document was prepared using Dragon voice recognition software and may include unintentional dictation errors.   Paulette Blanch, MD 01/28/19 [phone]

## 2019-01-27 NOTE — ED Notes (Signed)
Pt ambulatory to toilet to collect urine sample.  

## 2019-01-28 LAB — TROPONIN I (HIGH SENSITIVITY): Troponin I (High Sensitivity): 2 ng/L (ref <18)

## 2019-01-28 MED ORDER — CARVEDILOL 6.25 MG PO TABS
12.5000 mg | ORAL_TABLET | Freq: Once | ORAL | Status: AC
  Administered 2019-01-28: 12.5 mg via ORAL
  Filled 2019-01-28: qty 2

## 2019-01-28 NOTE — Discharge Instructions (Addendum)
Please pick up your Coreg prescription and restart it as directed by your doctor. Return to the ER for worsening symptoms, persistent vomiting, difficulty breathing or other concerns.

## 2019-01-29 ENCOUNTER — Encounter: Payer: Self-pay | Admitting: Podiatry

## 2019-01-29 ENCOUNTER — Ambulatory Visit (INDEPENDENT_AMBULATORY_CARE_PROVIDER_SITE_OTHER): Payer: 59 | Admitting: Podiatry

## 2019-01-29 ENCOUNTER — Ambulatory Visit: Payer: 59

## 2019-01-29 ENCOUNTER — Ambulatory Visit (INDEPENDENT_AMBULATORY_CARE_PROVIDER_SITE_OTHER): Payer: 59

## 2019-01-29 ENCOUNTER — Other Ambulatory Visit: Payer: Self-pay

## 2019-01-29 ENCOUNTER — Other Ambulatory Visit: Payer: Self-pay | Admitting: Podiatry

## 2019-01-29 DIAGNOSIS — R52 Pain, unspecified: Secondary | ICD-10-CM

## 2019-01-29 DIAGNOSIS — M25774 Osteophyte, right foot: Secondary | ICD-10-CM

## 2019-01-29 DIAGNOSIS — M19071 Primary osteoarthritis, right ankle and foot: Secondary | ICD-10-CM

## 2019-01-29 DIAGNOSIS — M79671 Pain in right foot: Secondary | ICD-10-CM

## 2019-01-29 NOTE — Progress Notes (Signed)
Subjective:  Patient ID: Alyssa Matthews, female    DOB: 06-12-1960,  MRN: [id number]  No chief complaint on file.   58 y.o. female presents with the above complaint.  Patient presents with acute onset of right first MPJ.  Patient states that she has not been able to walk because of the pain.  She states that she is normally in her still toe shoes given her work.  However only [employer] still toes actually help her with the pain.  She states her regular sneakers does not help control the pain.  She has tried taking Tylenol but has not helped.  She has not seen anyone else for this.  She denies any other acute complaints.  She ambulates in regular shoes today.   Review of Systems: Negative except as noted in the HPI. Denies N/V/F/Ch.  Past Medical History:  Diagnosis Date  . Abdominal pain, left lower quadrant 06/29/2015  . Benign neoplasm of sigmoid colon   . Bilateral numbness of feet 06/29/2015  . Chronic left shoulder pain 05/17/2017  . Decreased sex drive [id number]  . Dyspnea on exertion 04/18/2016  . Elevated glucose 05/18/2015  . Elevated LDL cholesterol level 06/29/2015  . Hand pain 01/01/2015  . History of blood transfusion   . Hypercholesteremia   . Hypertension   . Hyperthyroidism    a. s/p RAI  . Lacunar infarction ([place])    a. MRI brain 6/19: remote lacunar infarcts involving the pons  . Left ear pain 05/18/2015  . Left hip pain 01/01/2018  . Leg pain 04/16/2015  . Lipoma 04/16/2015  . PFO (patent foramen ovale)    a. TTE 6/19: EF 60-65%, no RWMA, Gr1DD, mild MR, RVSF normal, patent foramen ovale was noted with a positive saline contrast study, PASP normal  . Prediabetes 05/18/2015  . Rib contusion, left, initial encounter 06/28/2016  . Sebaceous cyst of labia 12/05/2016  . Shoulder pain, bilateral 01/15/2015  . Special screening for malignant neoplasms, colon   . Statin intolerance 05/18/2015  . Subclinical hyperthyroidism 06/28/2016  . TIA (transient ischemic attack) 08/2017  . Tobacco  abuse 05/18/2015  . URI (upper respiratory infection) 05/17/2017  . Vulvar lesion 10/31/2016  . Wears dentures    partial upper  . Weight loss 10/31/2016    Current Outpatient Medications:  .  amLODipine (NORVASC) 10 MG tablet, TAKE 1 TABLET BY MOUTH EVERY DAY, Disp: 90 tablet, Rfl: 1 .  amLODipine (NORVASC) 5 MG tablet, Take 1 tablet (5 mg total) by mouth daily., Disp: 90 tablet, Rfl: 3 .  aspirin EC 81 MG tablet, Take 81 mg by mouth daily., Disp: , Rfl:  .  chlorthalidone (HYGROTON) 25 MG tablet, Take 0.5 tablets (12.5 mg total) by mouth daily., Disp: 45 tablet, Rfl: 3 .  clopidogrel (PLAVIX) 75 MG tablet, TAKE 1 TABLET BY MOUTH EVERY DAY, Disp: 90 tablet, Rfl: 1 .  fluticasone (FLONASE) 50 MCG/ACT nasal spray, Place 2 sprays into both nostrils daily. (Patient not taking: Reported on 01/29/2019), Disp: 16 g, Rfl: 6 .  Multiple Vitamin (MULTIVITAMIN WITH MINERALS) TABS tablet, Take 1 tablet by mouth daily., Disp: , Rfl:  .  potassium chloride (K-DUR) 10 MEQ tablet, Take 1 tablet (10 mEq total) by mouth daily., Disp: 90 tablet, Rfl: 3 .  rosuvastatin (CRESTOR) 40 MG tablet, TAKE 1 TABLET BY MOUTH EVERY DAY, Disp: 90 tablet, Rfl: 2  Social History   Tobacco Use  Smoking Status Former Smoker  . Packs/day: 0.75  . Years: 30.00  .  Pack years: 22.50  . Types: Cigarettes  . Quit date: 08/2017  . Years since quitting: 1.4  Smokeless Tobacco Never Used    No Known Allergies Objective:  There were no vitals filed for this visit. There is no height or weight on file to calculate BMI. Constitutional Well developed. Well nourished.  Vascular Dorsalis pedis pulses palpable bilaterally. Posterior tibial pulses palpable bilaterally. Capillary refill normal to all digits.  No cyanosis or clubbing noted. Pedal hair growth normal.  Neurologic Normal speech. Oriented to person, place, and time. Epicritic sensation to light touch grossly present bilaterally.  Dermatologic Nails well groomed and  normal in appearance. No open wounds. No skin lesions.  Orthopedic:  Pain on palpation to the right first dorsal exostosis of the first MPJ.  Pain with end range of motion of the first MPJ.  Pain with intra-articular motion of the right first MPJ.  Pain with active and passive range of motion.   Radiographs: 3 views of skeletally mature adult: There is an increase in soft tissue density and volume on the first MPJ.  Multiple osteophytes noted within the joint.  There is subchondral sclerosis present at the base of the proximal phalanx.  There is dorsal exostosis with laxity noted at the first MPJ. Assessment:   1. Arthritis of first metatarsophalangeal (MTP) joint of left foot   2. Left foot pain   3. Osteophyte of right foot    Plan:  Patient was evaluated and treated and all questions answered.  Right first MPJ arthritis with osteophytes -I explained to the patient the the etiology and various treatment options available for arthritis of the first MPJ of the right foot.  I explained to the patient that certain shoe gears including's are still toe can definitely help as a limits the amount of range of motion of the first MPJ joint.  However I believe that without limiting the motion of the joints she will always have pain. -I believe patient will also benefit from a steroid injection to help calm down the acute inflammation to the joint. A steroid injection was performed at right first MTPJ joint using 1% plain Lidocaine and 10 mg of Kenalog. This was well tolerated.   -I believe she will certainly benefit with custom-made orthotics with Morton's extension.  Given the amount of arthritis, eventually patient may be a surgical candidate for metatarsophalangeal joint.  However at this time patient elected to not undergo surgery and will attempt to get the orthotics made. -Patient is scheduled to see [facility] for orthotics.  No follow-ups on file.

## 2019-01-30 ENCOUNTER — Encounter: Payer: Self-pay | Admitting: Family Medicine

## 2019-01-30 ENCOUNTER — Ambulatory Visit (INDEPENDENT_AMBULATORY_CARE_PROVIDER_SITE_OTHER): Payer: 59 | Admitting: Family Medicine

## 2019-01-30 ENCOUNTER — Other Ambulatory Visit: Payer: Self-pay

## 2019-01-30 VITALS — BP 118/66 | HR 68 | Temp 97.7°F | Wt 177.8 lb

## 2019-01-30 DIAGNOSIS — I10 Essential (primary) hypertension: Secondary | ICD-10-CM

## 2019-01-30 DIAGNOSIS — R42 Dizziness and giddiness: Secondary | ICD-10-CM

## 2019-01-30 MED ORDER — CHLORTHALIDONE 25 MG PO TABS: 12.5000 mg | ORAL_TABLET | Freq: Every day | ORAL | 3 refills | Status: DC

## 2019-01-30 NOTE — Progress Notes (Signed)
Subjective:    Patient ID: Alyssa Matthews, female    DOB: 1960/08/12, 58 y.o.   MRN: [id number]  HPI  Patient presents to clinic for ER follow-up.  Went to ER for dizziness.  Work-up done in ER to rule out stroke and cardiac issues.  Lab work and imaging of brain are unremarkable.  I was able to review imaging and labs as well as ER notes in epic.  Patient states she is feeling much better today.  Wondering if maybe the dizziness came on due to running out of her blood pressure medicine as well as some seasonal allergies/fullness in ears.  Feeling back to her baseline.  No chest pain, shortness of breath, palpitations or wheezing.  No lower extremity swelling.  No headaches.  No visual changes/slurred speech or any other neurological symptoms.  CBC Latest Ref Rng & Units 01/27/2019 09/25/2018 08/30/2018  WBC 4.0 - 10.5 K/uL 5.9 5.3 5.0  Hemoglobin 12.0 - 15.0 g/dL 10.8(L) 10.9(L) 11.3(L)  Hematocrit 36.0 - 46.0 % 33.6(L) 34.1 34.5(L)  Platelets 150 - 400 K/uL 286 276 274.0   CMP Latest Ref Rng & Units 01/27/2019 01/08/2019 10/08/2018  Glucose 70 - 99 mg/dL 128(H) 98 119(H)  BUN 6 - 20 mg/dL 14 14 14   Creatinine 0.44 - 1.00 mg/dL 0.83 0.55 0.61  Sodium 135 - 145 mmol/L 143 141 140  Potassium 3.5 - 5.1 mmol/L 3.5 3.4(L) 3.5  Chloride 98 - 111 mmol/L 111 102 104  CO2 22 - 32 mmol/L 24 31 26   Calcium 8.9 - 10.3 mg/dL 9.0 9.4 9.2  Total Protein 6.0 - 8.3 g/dL - 6.8 -  Total Bilirubin 0.2 - 1.2 mg/dL - 0.4 -  Alkaline Phos 39 - 117 U/L - 81 -  AST 0 - 37 U/L - 16 -  ALT 0 - 35 U/L - 15 -    Ct Head Wo Contrast  Result Date: 01/27/2019 CLINICAL DATA:  Dizziness EXAM: CT HEAD WITHOUT CONTRAST TECHNIQUE: Contiguous axial images were obtained from the base of the skull through the vertex without intravenous contrast. COMPARISON:  12/22/2017 FINDINGS: Brain: No acute intracranial abnormality. Specifically, no hemorrhage, hydrocephalus, mass lesion, acute infarction, or significant  intracranial injury. Vascular: No hyperdense vessel or unexpected calcification. Skull: No acute calvarial abnormality. Sinuses/Orbits: Visualized paranasal sinuses and mastoids clear. Orbital soft tissues unremarkable. Other: None IMPRESSION: Normal study. Electronically Signed   By: Rolm Baptise M.D.   On: 01/27/2019 23:25    Patient Active Problem List   Diagnosis Date Noted  . Right foot pain 01/08/2019  . Hyperlipidemia 09/03/2018  . Anemia 09/03/2018  . Light headedness 07/27/2018  . Overweight 05/04/2018  . Left hip pain 01/01/2018  . PFO (patent foramen ovale) 09/15/2017  . TIA (transient ischemic attack) 08/30/2017  . Chronic left shoulder pain 05/17/2017  . Sebaceous cyst of labia 12/05/2016  . Vulvar lesion 10/31/2016  . Subclinical hyperthyroidism 06/28/2016  . Dyspnea on exertion 04/18/2016  . Elevated LDL cholesterol level 06/29/2015  . Bilateral numbness of feet 06/29/2015  . Tobacco abuse 05/18/2015  . Prediabetes 05/18/2015  . Lipoma 04/16/2015  . Benign neoplasm of sigmoid colon   . Shoulder pain, bilateral 01/15/2015  . Essential hypertension 01/01/2015   Social History   Tobacco Use  . Smoking status: Former Smoker    Packs/day: 0.75    Years: 30.00    Pack years: 22.50    Types: Cigarettes    Quit date: 08/2017    Years since quitting:  1.4  . Smokeless tobacco: Never Used  Substance Use Topics  . Alcohol use: Not Currently    Alcohol/week: 0.0 standard drinks    Frequency: Never    Review of Systems  Constitutional: Negative for chills, fatigue and fever.  HENT: Negative for congestion, ear pain, sinus pain and sore throat.   Eyes: Negative.   Respiratory: Negative for cough, shortness of breath and wheezing.   Cardiovascular: Negative for chest pain, palpitations and leg swelling.  Gastrointestinal: Negative for abdominal pain, diarrhea, nausea and vomiting.  Genitourinary: Negative for dysuria, frequency and urgency.  Musculoskeletal:  Negative for arthralgias and myalgias.  Skin: Negative for color change, pallor and rash.  Neurological: Negative for syncope, light-headedness and headaches.  Psychiatric/Behavioral: The patient is not nervous/anxious.       Objective:   Physical Exam Vitals signs and nursing note reviewed.  Constitutional:      General: She is not in acute distress.    Appearance: She is not toxic-appearing.  HENT:     Head: Normocephalic and atraumatic.     Right Ear: Tympanic membrane, ear canal and external ear normal.     Left Ear: Tympanic membrane, ear canal and external ear normal.  Eyes:     General: No scleral icterus.    Extraocular Movements: Extraocular movements intact.     Conjunctiva/sclera: Conjunctivae normal.     Pupils: Pupils are equal, round, and reactive to light.  Neck:     Musculoskeletal: Normal range of motion and neck supple. No neck rigidity.  Cardiovascular:     Rate and Rhythm: Normal rate and regular rhythm.     Heart sounds: Normal heart sounds.  Musculoskeletal:     Right lower leg: No edema.     Left lower leg: No edema.  Skin:    General: Skin is warm and dry.     Coloration: Skin is not jaundiced or pale.  Neurological:     Mental Status: She is alert and oriented to person, place, and time.  Psychiatric:        Mood and Affect: Mood normal.        Behavior: Behavior normal.    Today's Vitals   01/30/19 0902  BP: 118/66  Pulse: 68  Temp: 97.7 F (36.5 C)  TempSrc: Temporal  SpO2: 99%  Weight: 177 lb 12.8 oz (80.6 kg)   Body mass index is 29.14 kg/m.     Assessment & Plan:    Essential hypertension  Light headedness/dizziness  - improved as of 01/30/19  Patient's blood pressure looks beautiful today.  All of her medications have been refilled and she is on all of her correct blood pressure medications.  Lightheadedness/dizziness is improved.  She is feeling back to her baseline.  She will keep close eye on herself and monitor for any  changes in symptoms.  She will watch her blood pressure.  She will eat a balanced diet and keep up good fluid intake.  She will call office and let us know if any symptoms change or new things develop.  She will otherwise keep all follow-ups with PCP as planned and return to clinic sooner if any issues arise

## 2019-02-11 ENCOUNTER — Telehealth: Payer: Self-pay

## 2019-02-11 NOTE — Telephone Encounter (Signed)
Copied from [place] [id number]. Topic: General - Inquiry >> Feb 11, 2019  8:23 AM Virl Axe D wrote: Reason for CRM: Pt stated she was out of work 01/28/19 and 01/29/19 and saw Philis Nettle on 01/30/19. She would like to know if Dr. Caryl Bis can fill out FMLA papers she is faxing over with that information. Please advise. She would like a call when they are received.

## 2019-02-11 NOTE — Telephone Encounter (Signed)
Pt called back stating to please add Claim # [id number] & her name

## 2019-02-11 NOTE — Telephone Encounter (Signed)
I called and let the patent know that I received her FMLA paperwork, I added her claim # and some information  and I put it in the to be signed basket. The patient stated this is only for 11/16 and 11/17 she was out of work those 2 days only and this is not a continuous thing this is just to cover those 2 days.  Merrie Epler,cma

## 2019-02-12 NOTE — Telephone Encounter (Signed)
Completed and placed in the signed folder.

## 2019-02-18 NOTE — Telephone Encounter (Signed)
Alyssa Matthews will refax & when this was faxed this morning we have fax confirmation that this went through.

## 2019-02-18 NOTE — Telephone Encounter (Signed)
Pt called back in, she said that her job is stating that paperwork wasn't received. They asked that it be re-faxed to:  Fax: [phone] with claim # [id number] on the form -- for 11/16 and 01/29/19

## 2019-02-18 NOTE — Telephone Encounter (Signed)
Faxed today.  Xavius Spadafore,cma  

## 2019-02-18 NOTE — Telephone Encounter (Signed)
Pt has called back and states that this fax was not received and it needs to be refax, asked for the fax# and pt states on the form

## 2019-02-22 ENCOUNTER — Other Ambulatory Visit: Payer: Self-pay | Admitting: Family Medicine

## 2019-02-22 DIAGNOSIS — G459 Transient cerebral ischemic attack, unspecified: Secondary | ICD-10-CM

## 2019-02-22 DIAGNOSIS — E785 Hyperlipidemia, unspecified: Secondary | ICD-10-CM

## 2019-02-22 MED ORDER — CLOPIDOGREL BISULFATE 75 MG PO TABS: 75.0000 mg | ORAL_TABLET | Freq: Every day | ORAL | 1 refills | Status: DC

## 2019-02-22 NOTE — Telephone Encounter (Signed)
Copied from [place] [id number]. Topic: Quick Communication - Rx Refill/Question >> Feb 22, 2019 10:04 AM Leward Quan A wrote: Medication: clopidogrel (PLAVIX) 75 MG tablet   Has the patient contacted their pharmacy? Yes.   (Agent: If no, request that the patient contact the pharmacy for the refill.) (Agent: If yes, when and what did the pharmacy advise?)  Preferred Pharmacy (with phone number or street name): [id number] - [place], [place]  Phone:  [phone] Fax:  [phone]     Agent: Please be advised that RX refills may take up to 3 business days. We ask that you follow-up with your pharmacy.

## 2019-02-27 NOTE — Telephone Encounter (Signed)
I called and spoke with the patient and she explained what was needed on the paperwork, I informed her that we have not received the paperwork yet but as soon as we do I will take care of it.  Alyssa Matthews,cma

## 2019-02-27 NOTE — Telephone Encounter (Signed)
Pt called back stating the FMLA paperwork needs to say intermittent leave and the claim number is [id number] name Alyssa Matthews. Paper work will be faxed back in to [place] from [employer].  Call pt @ [phone]

## 2019-03-11 ENCOUNTER — Telehealth: Payer: Self-pay | Admitting: Family Medicine

## 2019-03-11 NOTE — Telephone Encounter (Signed)
Pt called back stating the FMLA paperwork needs to have the claim number on each sheet and the claim number is [id number] name Alyssa Matthews. Fax number is [phone].  Call pt @ [phone]

## 2019-03-12 ENCOUNTER — Ambulatory Visit: Payer: 59 | Admitting: Podiatry

## 2019-03-12 NOTE — Telephone Encounter (Signed)
There is a new claim number and an error on the fax that has already been sent, the new form is in the sign basket and ready for your signature.  Tericka Devincenzi,cma

## 2019-03-13 NOTE — Telephone Encounter (Signed)
Signed and given to Nina.  

## 2019-03-13 NOTE — Telephone Encounter (Signed)
I faxed the new FMLA form for the patient and left a voicemail informing her.  Dayle Mcnerney,cma

## 2019-03-21 ENCOUNTER — Ambulatory Visit: Payer: 59 | Admitting: Podiatry

## 2019-03-28 ENCOUNTER — Other Ambulatory Visit: Payer: Self-pay

## 2019-03-28 ENCOUNTER — Ambulatory Visit (INDEPENDENT_AMBULATORY_CARE_PROVIDER_SITE_OTHER): Payer: BC Managed Care – PPO | Admitting: Podiatry

## 2019-03-28 ENCOUNTER — Encounter: Payer: Self-pay | Admitting: Podiatry

## 2019-03-28 DIAGNOSIS — M25774 Osteophyte, right foot: Secondary | ICD-10-CM

## 2019-03-28 DIAGNOSIS — M19071 Primary osteoarthritis, right ankle and foot: Secondary | ICD-10-CM

## 2019-03-28 DIAGNOSIS — M79671 Pain in right foot: Secondary | ICD-10-CM | POA: Diagnosis not present

## 2019-03-29 ENCOUNTER — Encounter: Payer: Self-pay | Admitting: Podiatry

## 2019-03-29 NOTE — Progress Notes (Signed)
Subjective:  Patient ID: Alyssa Matthews, female    DOB: [birth year],  MRN: [id number]  Chief Complaint  Patient presents with  . Foot Pain    follow up right foot   "its doing alot better"    59 y.o. female presents with the above complaint.  Patient presents with a follow-up of right first metatarsophalangeal joint arthritic pain.  Patient states that she is doing a lot better.  She does not have any more pain on palpation.  Patient has been able to walk regularly without any pain.  She states that she did not get orthotics because of insurance changes.  However if her pain returns she may consider getting orthotics.  She denies any other acute complaints today.  Review of Systems: Negative except as noted in the HPI. Denies N/V/F/Ch.  Past Medical History:  Diagnosis Date  . Abdominal pain, left lower quadrant 06/29/2015  . Benign neoplasm of sigmoid colon   . Bilateral numbness of feet 06/29/2015  . Chronic left shoulder pain 05/17/2017  . Decreased sex drive [id number]  . Dyspnea on exertion 04/18/2016  . Elevated glucose 05/18/2015  . Elevated LDL cholesterol level 06/29/2015  . Hand pain 01/01/2015  . History of blood transfusion   . Hypercholesteremia   . Hypertension   . Hyperthyroidism    a. s/p RAI  . Lacunar infarction ([place])    a. MRI brain 6/19: remote lacunar infarcts involving the pons  . Left ear pain 05/18/2015  . Left hip pain 01/01/2018  . Leg pain 04/16/2015  . Lipoma 04/16/2015  . PFO (patent foramen ovale)    a. TTE 6/19: EF 60-65%, no RWMA, Gr1DD, mild MR, RVSF normal, patent foramen ovale was noted with a positive saline contrast study, PASP normal  . Prediabetes 05/18/2015  . Rib contusion, left, initial encounter 06/28/2016  . Sebaceous cyst of labia 12/05/2016  . Shoulder pain, bilateral 01/15/2015  . Special screening for malignant neoplasms, colon   . Statin intolerance 05/18/2015  . Subclinical hyperthyroidism 06/28/2016  . TIA (transient ischemic attack) 08/2017  .  Tobacco abuse 05/18/2015  . URI (upper respiratory infection) 05/17/2017  . Vulvar lesion 10/31/2016  . Wears dentures    partial upper  . Weight loss 10/31/2016    Current Outpatient Medications:  .  amLODipine (NORVASC) 5 MG tablet, Take 1 tablet (5 mg total) by mouth daily., Disp: 90 tablet, Rfl: 3 .  aspirin EC 81 MG tablet, Take 81 mg by mouth daily., Disp: , Rfl:  .  chlorthalidone (HYGROTON) 25 MG tablet, Take 0.5 tablets (12.5 mg total) by mouth daily., Disp: 45 tablet, Rfl: 3 .  clopidogrel (PLAVIX) 75 MG tablet, Take 1 tablet (75 mg total) by mouth daily., Disp: 90 tablet, Rfl: 1 .  fluticasone (FLONASE) 50 MCG/ACT nasal spray, Place 2 sprays into both nostrils daily., Disp: 16 g, Rfl: 6 .  Multiple Vitamin (MULTIVITAMIN WITH MINERALS) TABS tablet, Take 1 tablet by mouth daily., Disp: , Rfl:  .  potassium chloride (K-DUR) 10 MEQ tablet, Take 1 tablet (10 mEq total) by mouth daily., Disp: 90 tablet, Rfl: 3 .  rosuvastatin (CRESTOR) 40 MG tablet, TAKE 1 TABLET BY MOUTH EVERY DAY, Disp: 90 tablet, Rfl: 2  Social History   Tobacco Use  Smoking Status Former Smoker  . Packs/day: 0.75  . Years: 30.00  . Pack years: 22.50  . Types: Cigarettes  . Quit date: 08/2017  . Years since quitting: 1.6  Smokeless Tobacco Never Used  No Known Allergies Objective:  There were no vitals filed for this visit. There is no height or weight on file to calculate BMI. Constitutional Well developed. Well nourished.  Vascular Dorsalis pedis pulses palpable bilaterally. Posterior tibial pulses palpable bilaterally. Capillary refill normal to all digits.  No cyanosis or clubbing noted. Pedal hair growth normal.  Neurologic Normal speech. Oriented to person, place, and time. Epicritic sensation to light touch grossly present bilaterally.  Dermatologic Nails well groomed and normal in appearance. No open wounds. No skin lesions.  Orthopedic:  No pain on palpation to the right first dorsal  exostosis of the first MPJ.  No pain with end range of motion of the first MPJ.  No pain with intra-articular motion of the right first MPJ.  No pain with active and passive range of motion.   Radiographs: None Assessment:   1. Arthritis of first metatarsophalangeal (MTP) joint of right foot   2. Right foot pain   3. Osteophyte of right foot    Plan:  Patient was evaluated and treated and all questions answered.  Right first MPJ arthritis with osteophytes -Resolved with steroid injection. -I believe she will certainly benefit with custom-made orthotics with Morton's extension.  Given the amount of arthritis, eventually patient may be a surgical candidate for metatarsophalangeal joint.  I explained to the patient that she will benefit from orthotics with Morton's extension.  Patient will hold off on obtaining more orthotics at this time given her insurance changes. -However if her pain returns and is not controlled with steroid injection patient will return and will obtain the orthotics.  No follow-ups on file.

## 2019-05-24 ENCOUNTER — Ambulatory Visit: Payer: Self-pay | Attending: Internal Medicine

## 2019-05-24 DIAGNOSIS — Z23 Encounter for immunization: Secondary | ICD-10-CM

## 2019-05-24 NOTE — Progress Notes (Signed)
   Covid-19 Vaccination Clinic  Name:  SOLEIL STOCKFORD    MRN: [id number] DOB: 22-Aug-1960  05/24/2019  Ms. Straughter was observed post Covid-19 immunization for 15 minutes without incident. She was provided with Vaccine Information Sheet and instruction to access the V-Safe system.   Ms. Stidd was instructed to call 911 with any severe reactions post vaccine: Marland Kitchen Difficulty breathing  . Swelling of face and throat  . A fast heartbeat  . A bad rash all over body  . Dizziness and weakness   Immunizations Administered    Name Date Dose VIS Date Route   Pfizer COVID-19 Vaccine 05/24/2019  9:41 AM 0.3 mL 02/22/2019 Intramuscular   Manufacturer: [place]   Lot: [id number]   [place]: [id number]

## 2019-06-11 ENCOUNTER — Other Ambulatory Visit: Payer: Self-pay

## 2019-06-11 ENCOUNTER — Ambulatory Visit (INDEPENDENT_AMBULATORY_CARE_PROVIDER_SITE_OTHER): Payer: BC Managed Care – PPO | Admitting: Podiatry

## 2019-06-11 ENCOUNTER — Encounter: Payer: Self-pay | Admitting: Podiatry

## 2019-06-11 DIAGNOSIS — M19071 Primary osteoarthritis, right ankle and foot: Secondary | ICD-10-CM

## 2019-06-11 DIAGNOSIS — M7751 Other enthesopathy of right foot: Secondary | ICD-10-CM

## 2019-06-11 DIAGNOSIS — M79671 Pain in right foot: Secondary | ICD-10-CM | POA: Diagnosis not present

## 2019-06-11 NOTE — Progress Notes (Signed)
Subjective:  Patient ID: Alyssa Matthews, female    DOB: Oct 17, 1960,  MRN: [id number]  Chief Complaint  Patient presents with  . Foot Pain    pt is here for an arthritis of right foot f/u, pt states that her foot pain has recently flaired up, and is looking to get it looked at.    59 y.o. female presents with the above complaint.  Patient presents with a follow-up of right first metatarsophalangeal joint arthritis after undergoing an aggravating factor that led to recurrence of the pain.  Patient states that she had pain relief for past 2 months however she went to a funeral that caused her to wear different type of shoes that to aggravation of the first metatarsophalangeal joint on the right.  She would like to know if she can get another injection to help decrease the acute inflammatory component to it.  I also discussed with her other treatment options that were available.  She denies any other acute complaints. Review of Systems: Negative except as noted in the HPI. Denies N/V/F/Ch.  Past Medical History:  Diagnosis Date  . Abdominal pain, left lower quadrant 06/29/2015  . Benign neoplasm of sigmoid colon   . Bilateral numbness of feet 06/29/2015  . Chronic left shoulder pain 05/17/2017  . Decreased sex drive [id number]  . Dyspnea on exertion 04/18/2016  . Elevated glucose 05/18/2015  . Elevated LDL cholesterol level 06/29/2015  . Hand pain 01/01/2015  . History of blood transfusion   . Hypercholesteremia   . Hypertension   . Hyperthyroidism    a. s/p RAI  . Lacunar infarction ([place])    a. MRI brain 6/19: remote lacunar infarcts involving the pons  . Left ear pain 05/18/2015  . Left hip pain 01/01/2018  . Leg pain 04/16/2015  . Lipoma 04/16/2015  . PFO (patent foramen ovale)    a. TTE 6/19: EF 60-65%, no RWMA, Gr1DD, mild MR, RVSF normal, patent foramen ovale was noted with a positive saline contrast study, PASP normal  . Prediabetes 05/18/2015  . Rib contusion, left, initial encounter  06/28/2016  . Sebaceous cyst of labia 12/05/2016  . Shoulder pain, bilateral 01/15/2015  . Special screening for malignant neoplasms, colon   . Statin intolerance 05/18/2015  . Subclinical hyperthyroidism 06/28/2016  . TIA (transient ischemic attack) 08/2017  . Tobacco abuse 05/18/2015  . URI (upper respiratory infection) 05/17/2017  . Vulvar lesion 10/31/2016  . Wears dentures    partial upper  . Weight loss 10/31/2016    Current Outpatient Medications:  .  amLODipine (NORVASC) 5 MG tablet, Take 1 tablet (5 mg total) by mouth daily., Disp: 90 tablet, Rfl: 3 .  aspirin EC 81 MG tablet, Take 81 mg by mouth daily., Disp: , Rfl:  .  clopidogrel (PLAVIX) 75 MG tablet, Take 1 tablet (75 mg total) by mouth daily., Disp: 90 tablet, Rfl: 1 .  fluticasone (FLONASE) 50 MCG/ACT nasal spray, Place 2 sprays into both nostrils daily., Disp: 16 g, Rfl: 6 .  Multiple Vitamin (MULTIVITAMIN WITH MINERALS) TABS tablet, Take 1 tablet by mouth daily., Disp: , Rfl:  .  rosuvastatin (CRESTOR) 40 MG tablet, TAKE 1 TABLET BY MOUTH EVERY DAY, Disp: 90 tablet, Rfl: 2 .  chlorthalidone (HYGROTON) 25 MG tablet, Take 0.5 tablets (12.5 mg total) by mouth daily., Disp: 45 tablet, Rfl: 3 .  potassium chloride (K-DUR) 10 MEQ tablet, Take 1 tablet (10 mEq total) by mouth daily., Disp: 90 tablet, Rfl: 3  Social History  Tobacco Use  Smoking Status Former Smoker  . Packs/day: 0.75  . Years: 30.00  . Pack years: 22.50  . Types: Cigarettes  . Quit date: 08/2017  . Years since quitting: 1.8  Smokeless Tobacco Never Used    No Known Allergies Objective:  There were no vitals filed for this visit. There is no height or weight on file to calculate BMI. Constitutional Well developed. Well nourished.  Vascular Dorsalis pedis pulses palpable bilaterally. Posterior tibial pulses palpable bilaterally. Capillary refill normal to all digits.  No cyanosis or clubbing noted. Pedal hair growth normal.  Neurologic Normal  speech. Oriented to person, place, and time. Epicritic sensation to light touch grossly present bilaterally.  Dermatologic Nails well groomed and normal in appearance. No open wounds. No skin lesions.  Orthopedic:  Moderate pain on palpation to the right first dorsal exostosis of the first MPJ.  Moderate pain with end range of motion of the first MPJ.  Moderate pain with intra-articular motion of the right first MPJ.  Moderate pain with active and passive range of motion.   Radiographs: None Assessment:   1. Osteophyte of right foot   2. Arthritis of first metatarsophalangeal (MTP) joint of right foot   3. Right foot pain    Plan:  Patient was evaluated and treated and all questions answered.  Right first MPJ arthritis with bone spurring -I explained to the patient that she might have had an acute flareup of the arthritis of the first metatarsophalangeal joint likely due to shoe gear modification that she underwent temporarily.  I believe patient will benefit from a steroid injection help decrease the acute inflammatory component associated with injection.  Patient agrees with the plan would like to proceed with another steroid injection to help decrease the pain. -A steroid injection was performed at right first MPJ using 1% plain Lidocaine and 10 mg of Kenalog. This was well tolerated.  -I believe she will certainly benefit with custom-made orthotics with Morton's extension.  Given the amount of arthritis, eventually patient may be a surgical candidate for metatarsophalangeal joint.  I explained to the patient that she will benefit from orthotics with Morton's extension.  Patient will hold off on obtaining more orthotics at this time given her insurance changes. -However if her pain returns and is not controlled with steroid injection patient will return and will obtain the orthotics.  No follow-ups on file.

## 2019-06-18 ENCOUNTER — Ambulatory Visit: Payer: Self-pay | Attending: Internal Medicine

## 2019-06-18 ENCOUNTER — Ambulatory Visit: Payer: Self-pay

## 2019-06-18 DIAGNOSIS — Z23 Encounter for immunization: Secondary | ICD-10-CM

## 2019-06-18 NOTE — Progress Notes (Signed)
   Covid-19 Vaccination Clinic  Name:  Alyssa Matthews    MRN: [id number] DOB: 03/09/1961  06/18/2019  Alyssa Matthews was observed post Covid-19 immunization for 15 minutes without incident. She was provided with Vaccine Information Sheet and instruction to access the V-Safe system.   Alyssa Matthews was instructed to call 911 with any severe reactions post vaccine: Marland Kitchen Difficulty breathing  . Swelling of face and throat  . A fast heartbeat  . A bad rash all over body  . Dizziness and weakness   Immunizations Administered    Name Date Dose VIS Date Route   Pfizer COVID-19 Vaccine 06/18/2019  9:58 AM 0.3 mL 02/22/2019 Intramuscular   Manufacturer: [place]   Lot: [id number]   [place]: [id number]

## 2019-06-27 ENCOUNTER — Telehealth: Payer: Self-pay | Admitting: Family Medicine

## 2019-06-27 ENCOUNTER — Other Ambulatory Visit: Payer: Self-pay

## 2019-06-27 ENCOUNTER — Emergency Department
Admission: EM | Admit: 2019-06-27 | Discharge: 2019-06-27 | Disposition: A | Discharge status: Home / Self Care | Payer: BC Managed Care – PPO | Attending: Emergency Medicine | Admitting: Emergency Medicine

## 2019-06-27 DIAGNOSIS — I10 Essential (primary) hypertension: Secondary | ICD-10-CM | POA: Insufficient documentation

## 2019-06-27 DIAGNOSIS — Z87891 Personal history of nicotine dependence: Secondary | ICD-10-CM | POA: Diagnosis not present

## 2019-06-27 DIAGNOSIS — E039 Hypothyroidism, unspecified: Secondary | ICD-10-CM | POA: Insufficient documentation

## 2019-06-27 DIAGNOSIS — Z8673 Personal history of transient ischemic attack (TIA), and cerebral infarction without residual deficits: Secondary | ICD-10-CM | POA: Diagnosis not present

## 2019-06-27 DIAGNOSIS — R202 Paresthesia of skin: Secondary | ICD-10-CM

## 2019-06-27 DIAGNOSIS — R42 Dizziness and giddiness: Secondary | ICD-10-CM | POA: Diagnosis present

## 2019-06-27 LAB — CBC
HCT: 33.5 % — ABNORMAL LOW (ref 36.0–46.0)
Hemoglobin: 11.2 g/dL — ABNORMAL LOW (ref 12.0–15.0)
MCH: 28 pg (ref 26.0–34.0)
MCHC: 33.4 g/dL (ref 30.0–36.0)
MCV: 83.8 fL (ref 80.0–100.0)
Platelets: 275 10*3/uL (ref 150–400)
RBC: 4 MIL/uL (ref 3.87–5.11)
RDW: 14.6 % (ref 11.5–15.5)
WBC: 5.8 10*3/uL (ref 4.0–10.5)
nRBC: 0 % (ref 0.0–0.2)

## 2019-06-27 LAB — BASIC METABOLIC PANEL
Anion gap: 10 (ref 5–15)
BUN: 14 mg/dL (ref 6–20)
CO2: 27 mmol/L (ref 22–32)
Calcium: 9.2 mg/dL (ref 8.9–10.3)
Chloride: 101 mmol/L (ref 98–111)
Creatinine, Ser: 0.58 mg/dL (ref 0.44–1.00)
GFR calc Af Amer: >60 mL/min (ref >60)
GFR calc non Af Amer: >60 mL/min (ref >60)
Glucose, Bld: 114 mg/dL — ABNORMAL HIGH (ref 70–99)
Potassium: 2.9 mmol/L — ABNORMAL LOW (ref 3.5–5.1)
Sodium: 138 mmol/L (ref 135–145)

## 2019-06-27 MED ORDER — SODIUM CHLORIDE 0.9% FLUSH: 3.0000 mL | Freq: Once | INTRAVENOUS | Status: DC

## 2019-06-27 MED ORDER — POTASSIUM CHLORIDE CRYS ER 20 MEQ PO TBCR
40.0000 meq | EXTENDED_RELEASE_TABLET | Freq: Once | ORAL | Status: AC
  Administered 2019-06-27: 40 meq via ORAL
  Filled 2019-06-27: qty 2

## 2019-06-27 NOTE — ED Triage Notes (Signed)
Pt comes via POV from home with c/o left arm tingling and dizziness. Pt states this started 2 days ago. Pt states the tingling has subsided some.  Pt denies any SOB or CP today.pt denies any headache and N/V/D.  Pt states little blurred vision earlier today but is now gone.

## 2019-06-27 NOTE — Telephone Encounter (Signed)
Spoken with patient, she is lightheaded and also has numbness in left arm. No jaw pain, blurry vision, chest px, neck px and face drooping. Patient did have SOB yesterday. Instructed patient to go to ED due to her history of TIA. Patient stated she will comply.

## 2019-06-27 NOTE — Telephone Encounter (Signed)
Transferred to [employer] called she is having tingling in her left arm and is lightheaded. Pt had SOB yesterday

## 2019-06-27 NOTE — ED Provider Notes (Signed)
[facility] Emergency Department Provider Note       Time seen: ----------------------------------------- 5:55 PM on 06/27/2019 -----------------------------------------   I have reviewed the triage vital signs and the nursing notes.  HISTORY   Chief Complaint Dizziness    HPI Alyssa Matthews is a 59 y.o. female with a history of hyperlipidemia, hypertension, hypothyroidism, CVA, TIA who presents to the ED for left arm tingling and dizziness.  Patient states that started 2 days ago, states the tingling has subsided some.  Denies any shortness of breath or chest pain today, denies headache, nausea, vomiting or diarrhea.  Had some blurry vision earlier today but this is now gone.  Past Medical History:  Diagnosis Date  . Abdominal pain, left lower quadrant 06/29/2015  . Benign neoplasm of sigmoid colon   . Bilateral numbness of feet 06/29/2015  . Chronic left shoulder pain 05/17/2017  . Decreased sex drive [id number]  . Dyspnea on exertion 04/18/2016  . Elevated glucose 05/18/2015  . Elevated LDL cholesterol level 06/29/2015  . Hand pain 01/01/2015  . History of blood transfusion   . Hypercholesteremia   . Hypertension   . Hyperthyroidism    a. s/p RAI  . Lacunar infarction ([place])    a. MRI brain 6/19: remote lacunar infarcts involving the pons  . Left ear pain 05/18/2015  . Left hip pain 01/01/2018  . Leg pain 04/16/2015  . Lipoma 04/16/2015  . PFO (patent foramen ovale)    a. TTE 6/19: EF 60-65%, no RWMA, Gr1DD, mild MR, RVSF normal, patent foramen ovale was noted with a positive saline contrast study, PASP normal  . Prediabetes 05/18/2015  . Rib contusion, left, initial encounter 06/28/2016  . Sebaceous cyst of labia 12/05/2016  . Shoulder pain, bilateral 01/15/2015  . Special screening for malignant neoplasms, colon   . Statin intolerance 05/18/2015  . Subclinical hyperthyroidism 06/28/2016  . TIA (transient ischemic attack) 08/2017  . Tobacco abuse 05/18/2015  .  URI (upper respiratory infection) 05/17/2017  . Vulvar lesion 10/31/2016  . Wears dentures    partial upper  . Weight loss 10/31/2016    Patient Active Problem List   Diagnosis Date Noted  . Right foot pain 01/08/2019  . Hyperlipidemia 09/03/2018  . Anemia 09/03/2018  . Light headedness 07/27/2018  . Overweight 05/04/2018  . Left hip pain 01/01/2018  . PFO (patent foramen ovale) 09/15/2017  . TIA (transient ischemic attack) 08/30/2017  . Chronic left shoulder pain 05/17/2017  . Sebaceous cyst of labia 12/05/2016  . Vulvar lesion 10/31/2016  . Subclinical hyperthyroidism 06/28/2016  . Dyspnea on exertion 04/18/2016  . Elevated LDL cholesterol level 06/29/2015  . Bilateral numbness of feet 06/29/2015  . Tobacco abuse 05/18/2015  . Prediabetes 05/18/2015  . Lipoma 04/16/2015  . Benign neoplasm of sigmoid colon   . Shoulder pain, bilateral 01/15/2015  . Essential hypertension 01/01/2015    Past Surgical History:  Procedure Laterality Date  . BREAST CYST ASPIRATION  2015  . COLONOSCOPY WITH PROPOFOL N/A 03/30/2015   Procedure: COLONOSCOPY WITH PROPOFOL;  Surgeon: Lucilla Lame, MD;  Location: [place];  Service: Endoscopy;  Laterality: N/A;  . DILATION AND CURETTAGE OF UTERUS    . POLYPECTOMY  03/30/2015   Procedure: POLYPECTOMY;  Surgeon: Lucilla Lame, MD;  Location: [place];  Service: Endoscopy;;    Allergies Patient has no known allergies.  Social History Social History   Tobacco Use  . Smoking status: Former Smoker    Packs/day: 0.75  Years: 30.00    Pack years: 22.50    Types: Cigarettes    Quit date: 08/2017    Years since quitting: 1.8  . Smokeless tobacco: Never Used  Substance Use Topics  . Alcohol use: Not Currently    Alcohol/week: 0.0 standard drinks  . Drug use: No    Review of Systems Constitutional: Negative for fever. Cardiovascular: Negative for chest pain. Respiratory: Negative for shortness of breath. Gastrointestinal:  Negative for abdominal pain, vomiting and diarrhea. Musculoskeletal: Negative for back pain. Skin: Negative for rash. Neurological: Positive for paresthesias and dizziness  All systems negative/normal/unremarkable except as stated in the HPI  ____________________________________________   PHYSICAL EXAM:  VITAL SIGNS: ED Triage Vitals  Enc Vitals Group     BP 06/27/19 1636 119/72     Pulse Rate 06/27/19 1636 69     Resp 06/27/19 1636 19     Temp 06/27/19 1636 98.3 F (36.8 C)     Temp src --      SpO2 06/27/19 1636 98 %     Weight 06/27/19 1633 180 lb (81.6 kg)     Height 06/27/19 1633 5\' 5"  (1.651 m)     Head Circumference --      Peak Flow --      Pain Score 06/27/19 1633 0     Pain Loc --      Pain Edu? --      Excl. in [place]? --     Constitutional: Alert and oriented. Well appearing and in no distress. Eyes: Conjunctivae are normal. Normal extraocular movements. ENT      Head: Normocephalic and atraumatic.      Nose: No congestion/rhinnorhea.      Mouth/Throat: Mucous membranes are moist.      Neck: No stridor. Cardiovascular: Normal rate, regular rhythm. No murmurs, rubs, or gallops. Respiratory: Normal respiratory effort without tachypnea nor retractions. Breath sounds are clear and equal bilaterally. No wheezes/rales/rhonchi. Gastrointestinal: Soft and nontender. Normal bowel sounds Musculoskeletal: Nontender with normal range of motion in extremities. No lower extremity tenderness nor edema. Neurologic:  Normal speech and language. No gross focal neurologic deficits are appreciated.  Skin:  Skin is warm, dry and intact. No rash noted. Psychiatric: Mood and affect are normal. Speech and behavior are normal.  ____________________________________________  EKG: Interpreted by me.  Sinus rhythm with a rate of 67 bpm, ST and T wave abnormality, normal axis, normal QT  ____________________________________________  ED COURSE:  As part of my medical decision making, I  reviewed the following data within the [place] History obtained from family if available, nursing notes, old chart and ekg, as well as notes from prior ED visits. Patient presented for tingling and dizziness, we will assess with labs and imaging as indicated at this time.   Procedures  BERNARDETTE TRUJEQUE was evaluated in Emergency Department on 06/27/2019 for the symptoms described in the history of present illness. She was evaluated in the context of the global COVID-19 pandemic, which necessitated consideration that the patient might be at risk for infection with the SARS-CoV-2 virus that causes COVID-19. Institutional protocols and algorithms that pertain to the evaluation of patients at risk for COVID-19 are in a state of rapid change based on information released by regulatory bodies including the CDC and federal and state organizations. These policies and algorithms were followed during the patient's care in the ED.  ____________________________________________   LABS (pertinent positives/negatives)  Labs Reviewed  BASIC METABOLIC PANEL - Abnormal; Notable  for the following components:      Result Value   Potassium 2.9 (*)    Glucose, Bld 114 (*)    All other components within normal limits  CBC - Abnormal; Notable for the following components:   Hemoglobin 11.2 (*)    HCT 33.5 (*)    All other components within normal limits  URINALYSIS, COMPLETE (UACMP) WITH MICROSCOPIC  CBG MONITORING, ED   ____________________________________________   DIFFERENTIAL DIAGNOSIS   CVA, TIA, peripheral neuropathy, paresthesia, anxiety, electrolyte abnormality  FINAL ASSESSMENT AND PLAN  Paresthesia, dizziness, mild hypokalemia   Plan: The patient had presented for paresthesias and dizziness. Patient's labs did reveal mild hypokalemia but otherwise no acute process.  This likely explains some of her symptoms.  She is encouraged to have close outpatient follow-up with her  doctor.   Laurence Aly, MD    Note: This note was generated in part or whole with voice recognition software. Voice recognition is usually quite accurate but there are transcription errors that can and very often do occur. I apologize for any typographical errors that were not detected and corrected.     Earleen Newport, MD 06/27/19 [phone]

## 2019-06-27 NOTE — Telephone Encounter (Signed)
Agree with advice to get evaluated.

## 2019-07-02 ENCOUNTER — Encounter: Payer: Self-pay | Admitting: Family Medicine

## 2019-07-02 ENCOUNTER — Other Ambulatory Visit: Payer: Self-pay

## 2019-07-02 ENCOUNTER — Ambulatory Visit (INDEPENDENT_AMBULATORY_CARE_PROVIDER_SITE_OTHER): Payer: BC Managed Care – PPO | Admitting: Family Medicine

## 2019-07-02 DIAGNOSIS — R42 Dizziness and giddiness: Secondary | ICD-10-CM | POA: Diagnosis not present

## 2019-07-02 DIAGNOSIS — I10 Essential (primary) hypertension: Secondary | ICD-10-CM

## 2019-07-02 DIAGNOSIS — E876 Hypokalemia: Secondary | ICD-10-CM | POA: Insufficient documentation

## 2019-07-02 LAB — BASIC METABOLIC PANEL
BUN: 15 mg/dL (ref 6–23)
CO2: 31 mEq/L (ref 19–32)
Calcium: 9.3 mg/dL (ref 8.4–10.5)
Chloride: 102 mEq/L (ref 96–112)
Creatinine, Ser: 0.63 mg/dL (ref 0.40–1.20)
GFR: 117.96 mL/min (ref >60.00)
Glucose, Bld: 105 mg/dL — ABNORMAL HIGH (ref 70–99)
Potassium: 3.2 mEq/L — ABNORMAL LOW (ref 3.5–5.1)
Sodium: 140 mEq/L (ref 135–145)

## 2019-07-02 NOTE — Assessment & Plan Note (Signed)
Suspect dehydration played a role in this.  She notes improvement with increased fluid intake.  She will monitor.  Orthostatics today.

## 2019-07-02 NOTE — Assessment & Plan Note (Signed)
This likely was the cause of her symptoms of possible PVCs and tingling.  We will recheck her labs.

## 2019-07-02 NOTE — Progress Notes (Signed)
  Tommi Rumps, MD Phone: [phone]  Alyssa Matthews is a 59 y.o. female who presents today for f/u.  Hypokalemia: Patient went to the ED with left shoulder tingling and lightheadedness.  Labs revealed hypokalemia.  She also noted what seemed to be PVCs with a sudden thump in her chest.  She was repleted and notes she has not had any recurrent symptoms.  No weakness, numbness, tingling, or vision changes otherwise.  Hypertension: She is taking amlodipine and [place].  BPs are similar to today at home.  No chest pain or shortness of breath.  She does note lightheadedness the day she went to the ED.  This improved with fluid intake.  Social History   Tobacco Use  Smoking Status Former Smoker  . Packs/day: 0.75  . Years: 30.00  . Pack years: 22.50  . Types: Cigarettes  . Quit date: 08/2017  . Years since quitting: 1.8  Smokeless Tobacco Never Used     ROS see history of present illness  Objective  Physical Exam Vitals:   07/02/19 1144  BP: 120/80  Pulse: 72  Temp: (!) 97.1 F (36.2 C)  SpO2: 98%   Lying blood pressure 110/80 pulse 64 Sitting blood pressure 110/80 pulse 67 Standing blood pressure 110/70 pulse 72   BP Readings from Last 3 Encounters:  07/02/19 120/80  06/27/19 129/84  01/30/19 118/66   Wt Readings from Last 3 Encounters:  07/02/19 182 lb 9.6 oz (82.8 kg)  06/27/19 180 lb (81.6 kg)  01/30/19 177 lb 12.8 oz (80.6 kg)    Physical Exam Constitutional:      General: She is not in acute distress.    Appearance: She is not diaphoretic.  Cardiovascular:     Rate and Rhythm: Normal rate and regular rhythm.     Heart sounds: Normal heart sounds.  Pulmonary:     Effort: Pulmonary effort is normal.     Breath sounds: Normal breath sounds.  Musculoskeletal:     Right lower leg: No edema.     Left lower leg: No edema.  Skin:    General: Skin is warm and dry.  Neurological:     Mental Status: She is alert.     Comments: 5/5 strength in  bilateral biceps, triceps, grip, quads, hamstrings, plantar and dorsiflexion, sensation to light touch intact in bilateral UE and LE, normal gait      Assessment/Plan: Please see individual problem list.  Essential hypertension Well-controlled.  We are going to check labs and then consider changing her chlorthalidone to something else that will not cause hypokalemia.  She will continue her current medications for now.  Hypokalemia This likely was the cause of her symptoms of possible PVCs and tingling.  We will recheck her labs.  Light headedness Suspect dehydration played a role in this.  She notes improvement with increased fluid intake.  She will monitor.  Orthostatics today.    Orders Placed This Encounter  Procedures  . Basic Metabolic Panel (BMET)    No orders of the defined types were placed in this encounter.   This visit occurred during the SARS-CoV-2 public health emergency.  Safety protocols were in place, including screening questions prior to the visit, additional usage of staff PPE, and extensive cleaning of exam room while observing appropriate contact time as indicated for disinfecting solutions.    Tommi Rumps, MD [place]

## 2019-07-02 NOTE — Patient Instructions (Signed)
Nice to see you. We will check labs and then contact you to change your BP meds.

## 2019-07-02 NOTE — Assessment & Plan Note (Signed)
Well-controlled.  We are going to check labs and then consider changing her chlorthalidone to something else that will not cause hypokalemia.  She will continue her current medications for now.

## 2019-07-04 ENCOUNTER — Telehealth: Payer: Self-pay

## 2019-07-04 DIAGNOSIS — I10 Essential (primary) hypertension: Secondary | ICD-10-CM

## 2019-07-04 MED ORDER — LOSARTAN POTASSIUM 50 MG PO TABS: 50.0000 mg | ORAL_TABLET | Freq: Every day | ORAL | 1 refills | Status: DC

## 2019-07-04 NOTE — Addendum Note (Signed)
Addended by: Fulton Mole D on: 07/04/2019 09:55 AM   Modules accepted: Orders

## 2019-07-04 NOTE — Telephone Encounter (Signed)
Patient called and I informed her of her lab results and I informed her that the provider was sending in a new BP medication for her to try, I also scheduled a lab appt to check her BMP after starting on the new medication and I ordered lab and the medication per Dr. Caryl Bis.  Alyssa Matthews,cma

## 2019-07-04 NOTE — Telephone Encounter (Signed)
-----   Message from Leone Haven, MD sent at 07/02/2019  4:28 PM EDT ----- Please let the patient know her potassium is still mildly low.  I would like for her to discontinue the chlorthalidone.  I would like to start her on losartan.  I suspect her low potassium is related to the chlorthalidone.  She will need a BMP for a diagnosis of hypertension 1 week after starting on losartan 50 mg once daily by mouth.  This can be sent into the pharmacy with 90 tablets and 1 refill.

## 2019-07-12 ENCOUNTER — Other Ambulatory Visit: Payer: Self-pay

## 2019-07-12 ENCOUNTER — Other Ambulatory Visit (INDEPENDENT_AMBULATORY_CARE_PROVIDER_SITE_OTHER): Payer: BC Managed Care – PPO

## 2019-07-12 DIAGNOSIS — I10 Essential (primary) hypertension: Secondary | ICD-10-CM | POA: Diagnosis not present

## 2019-07-12 LAB — BASIC METABOLIC PANEL
BUN: 15 mg/dL (ref 6–23)
CO2: 27 mEq/L (ref 19–32)
Calcium: 9.1 mg/dL (ref 8.4–10.5)
Chloride: 109 mEq/L (ref 96–112)
Creatinine, Ser: 0.68 mg/dL (ref 0.40–1.20)
GFR: 108 mL/min (ref >60.00)
Glucose, Bld: 119 mg/dL — ABNORMAL HIGH (ref 70–99)
Potassium: 3.6 mEq/L (ref 3.5–5.1)
Sodium: 142 mEq/L (ref 135–145)

## 2019-07-22 ENCOUNTER — Ambulatory Visit (INDEPENDENT_AMBULATORY_CARE_PROVIDER_SITE_OTHER): Payer: BC Managed Care – PPO | Admitting: Family Medicine

## 2019-07-22 ENCOUNTER — Other Ambulatory Visit (HOSPITAL_COMMUNITY)
Admission: RE | Admit: 2019-07-22 | Discharge: 2019-07-22 | Disposition: A | Discharge status: Home / Self Care | Payer: BC Managed Care – PPO | Source: Ambulatory Visit | Attending: Family Medicine | Admitting: Family Medicine

## 2019-07-22 ENCOUNTER — Other Ambulatory Visit: Payer: Self-pay

## 2019-07-22 ENCOUNTER — Encounter: Payer: Self-pay | Admitting: Family Medicine

## 2019-07-22 VITALS — BP 110/70 | HR 67 | Temp 97.2°F | Ht 63.0 in | Wt 183.2 lb

## 2019-07-22 DIAGNOSIS — Z124 Encounter for screening for malignant neoplasm of cervix: Secondary | ICD-10-CM | POA: Diagnosis present

## 2019-07-22 DIAGNOSIS — R7309 Other abnormal glucose: Secondary | ICD-10-CM | POA: Diagnosis not present

## 2019-07-22 DIAGNOSIS — Z0001 Encounter for general adult medical examination with abnormal findings: Secondary | ICD-10-CM

## 2019-07-22 DIAGNOSIS — Z1231 Encounter for screening mammogram for malignant neoplasm of breast: Secondary | ICD-10-CM | POA: Diagnosis not present

## 2019-07-22 DIAGNOSIS — M791 Myalgia, unspecified site: Secondary | ICD-10-CM | POA: Insufficient documentation

## 2019-07-22 LAB — HEMOGLOBIN A1C: Hgb A1c MFr Bld: 6.3 % (ref 4.6–6.5)

## 2019-07-22 LAB — CK: Total CK: 116 U/L (ref 7–177)

## 2019-07-22 LAB — COMPREHENSIVE METABOLIC PANEL
ALT: 17 U/L (ref 0–35)
AST: 17 U/L (ref 0–37)
Albumin: 4.1 g/dL (ref 3.5–5.2)
Alkaline Phosphatase: 79 U/L (ref 39–117)
BUN: 14 mg/dL (ref 6–23)
CO2: 28 mEq/L (ref 19–32)
Calcium: 9.1 mg/dL (ref 8.4–10.5)
Chloride: 107 mEq/L (ref 96–112)
Creatinine, Ser: 0.6 mg/dL (ref 0.40–1.20)
GFR: 124.77 mL/min (ref >60.00)
Glucose, Bld: 98 mg/dL (ref 70–99)
Potassium: 3.7 mEq/L (ref 3.5–5.1)
Sodium: 141 mEq/L (ref 135–145)
Total Bilirubin: 0.4 mg/dL (ref 0.2–1.2)
Total Protein: 6.7 g/dL (ref 6.0–8.3)

## 2019-07-22 LAB — TSH: TSH: 0.36 u[IU]/mL (ref 0.35–4.50)

## 2019-07-22 NOTE — Assessment & Plan Note (Signed)
Possibly related to weight versus statin therapy.  We will check labs as outlined below.

## 2019-07-22 NOTE — Patient Instructions (Signed)
Nice to see you. Please continue to work on diet and exercise. We will contact you with your lab results and Pap smear results. Please check with your insurance regarding the Shingrix vaccine and see if they cover this.

## 2019-07-22 NOTE — Assessment & Plan Note (Signed)
Check A1c. 

## 2019-07-22 NOTE — Assessment & Plan Note (Signed)
Physical exam completed.  Encourage diet and exercise.  I congratulated her on her dietary changes and starting to exercise.  She will keep that up.  Pap smear completed.  She will call to schedule her mammogram once its been 6 weeks after her Covid vaccine.  She declines tetanus vaccine today.  She will contact her insurance regarding Shingrix to see if they cover this.  I encouraged her to see a dentist and an ophthalmologist.  Lab work as outlined below.

## 2019-07-22 NOTE — Progress Notes (Signed)
Tommi Rumps, MD Phone: [phone]  Alyssa Matthews is a 59 y.o. female who presents today for CPE.  Diet: Not good.  She started making changes recently.  She is cut back on soda significantly and sweet tea significantly.  She is cut down on [place] fries.  Also was eating lots of ice cream and cookies. Exercise: She started exercising several days a week with walking. Pap smear: Due for Pap smear. Colonoscopy: 01/28/2016 with hyperplastic polyps and 10-year recall. Mammogram: 08/17/2018 negative, she is going to call to schedule her next mammogram. Family history-  Colon cancer: No  Breast cancer:   Ovarian cancer: No Menses: postmenopausal, no bleeding Vaccines-   Flu: Out of season  Tetanus: Defers  Shingles: Once this done though she will check with insurance  COVID19: Up-to-date HIV screening: Up-to-date Hep C Screening: Up-to-date Tobacco use: Former smoker, quit 2 years ago Alcohol use: Occasional wine Illicit Drug use: No Dentist: Overdue Ophthalmology: Overdue Does note occasional muscle aching that she thinks is related to her weight.  She is on a statin.   Active Ambulatory Problems    Diagnosis Date Noted  . Essential hypertension 01/01/2015  . Shoulder pain, bilateral 01/15/2015  . Benign neoplasm of sigmoid colon   . Lipoma 04/16/2015  . Elevated glucose 05/18/2015  . Tobacco abuse 05/18/2015  . Prediabetes 05/18/2015  . Elevated LDL cholesterol level 06/29/2015  . Bilateral numbness of feet 06/29/2015  . Dyspnea on exertion 04/18/2016  . Subclinical hyperthyroidism 06/28/2016  . Vulvar lesion 10/31/2016  . Sebaceous cyst of labia 12/05/2016  . Chronic left shoulder pain 05/17/2017  . TIA (transient ischemic attack) 08/30/2017  . PFO (patent foramen ovale) 09/15/2017  . Left hip pain 01/01/2018  . Overweight 05/04/2018  . Light headedness 07/27/2018  . Hyperlipidemia 09/03/2018  . Anemia 09/03/2018  . Right foot pain 01/08/2019  . Hypokalemia  07/02/2019  . Encounter for general adult medical examination with abnormal findings 07/22/2019  . Myalgia 07/22/2019   Resolved Ambulatory Problems    Diagnosis Date Noted  . Hand pain 01/01/2015  . Annual physical exam 01/15/2015  . Special screening for malignant neoplasms, colon   . Leg pain 04/16/2015  . Decreased sex drive 78/67/6720  . Statin intolerance 05/18/2015  . Left ear pain 05/18/2015  . Abdominal pain, left lower quadrant 06/29/2015  . Rib contusion, left, initial encounter 06/28/2016  . Weight loss 10/31/2016  . URI (upper respiratory infection) 05/17/2017  . ERRONEOUS ENCOUNTER--DISREGARD 06/21/2018   Past Medical History:  Diagnosis Date  . History of blood transfusion   . Hypercholesteremia   . Hypertension   . Hyperthyroidism   . Lacunar infarction ([place])   . Wears dentures     Family History  Problem Relation Age of Onset  . Hypertension Mother   . Kidney disease Brother   . Cancer Father        In neck  . Drug abuse Paternal Grandmother   . Hypertension Brother   . Breast cancer Neg Hx   . Thyroid disease Neg Hx     Social History   Socioeconomic History  . Marital status: Married    Spouse name: Not on file  . Number of children: Not on file  . Years of education: Not on file  . Highest education level: Not on file  Occupational History  . Not on file  Tobacco Use  . Smoking status: Former Smoker    Packs/day: 0.75    Years: 30.00  Pack years: 22.50    Types: Cigarettes    Quit date: 08/2017    Years since quitting: 1.9  . Smokeless tobacco: Never Used  Substance and Sexual Activity  . Alcohol use: Not Currently    Alcohol/week: 0.0 standard drinks  . Drug use: No  . Sexual activity: Yes  Other Topics Concern  . Not on file  Social History Narrative   Patient works on an Designer, television/film set.   Social Determinants of Health   Financial Resource Strain:   . Difficulty of Paying Living Expenses:   Food Insecurity:   . Worried  About Charity fundraiser in the Last Year:   . Arboriculturist in the Last Year:   Transportation Needs:   . Film/video editor (Medical):   Marland Kitchen Lack of Transportation (Non-Medical):   Physical Activity:   . Days of Exercise per Week:   . Minutes of Exercise per Session:   Stress:   . Feeling of Stress :   Social Connections:   . Frequency of Communication with Friends and Family:   . Frequency of Social Gatherings with Friends and Family:   . Attends Religious Services:   . Active Member of Clubs or Organizations:   . Attends Archivist Meetings:   Marland Kitchen Marital Status:   Intimate Partner Violence:   . Fear of Current or Ex-Partner:   . Emotionally Abused:   Marland Kitchen Physically Abused:   . Sexually Abused:     ROS  General:  Negative for nexplained weight loss, fever Skin: Negative for new or changing mole, sore that won't heal HEENT: Negative for trouble hearing, trouble seeing, ringing in ears, mouth sores, hoarseness, change in voice, dysphagia. CV:  Negative for chest pain, dyspnea, edema, palpitations Resp: Negative for cough, dyspnea, hemoptysis GI: Negative for nausea, vomiting, diarrhea, constipation, abdominal pain, melena, hematochezia. GU: Negative for dysuria, incontinence, urinary hesitance, hematuria, vaginal or penile discharge, polyuria, sexual difficulty, lumps in testicle or breasts MSK: Negative for muscle cramps or aches, joint pain or swelling Neuro: Negative for headaches, weakness, numbness, dizziness, passing out/fainting Psych: Negative for depression, anxiety, memory problems  Objective  Physical Exam Vitals:   07/22/19 0935  BP: 110/70  Pulse: 67  Temp: (!) 97.2 F (36.2 C)    BP Readings from Last 3 Encounters:  07/22/19 110/70  07/02/19 120/80  06/27/19 129/84   Wt Readings from Last 3 Encounters:  07/22/19 183 lb 3.2 oz (83.1 kg)  07/02/19 182 lb 9.6 oz (82.8 kg)  06/27/19 180 lb (81.6 kg)    Physical Exam Constitutional:        General: She is not in acute distress.    Appearance: She is not diaphoretic.  HENT:     Head: Normocephalic and atraumatic.  Eyes:     Conjunctiva/sclera: Conjunctivae normal.     Pupils: Pupils are equal, round, and reactive to light.  Cardiovascular:     Rate and Rhythm: Normal rate and regular rhythm.     Heart sounds: Normal heart sounds.  Pulmonary:     Effort: Pulmonary effort is normal.     Breath sounds: Normal breath sounds.  Abdominal:     General: Bowel sounds are normal. There is no distension.     Palpations: Abdomen is soft.     Tenderness: There is no abdominal tenderness. There is no guarding or rebound.  Genitourinary:    Comments: Fulton Mole, CMA served as chaperone, normal labia, normal vaginal mucosa, normal-appearing cervix,  no cervical motion tenderness, no adnexal masses or tenderness, there are skin tags at her rectum, bilateral breast with no skin changes, nipple inversion, masses, or tenderness, no axillary masses bilaterally Musculoskeletal:     Right lower leg: No edema.     Left lower leg: No edema.  Lymphadenopathy:     Cervical: No cervical adenopathy.  Skin:    General: Skin is warm and dry.  Neurological:     Mental Status: She is alert.  Psychiatric:        Mood and Affect: Mood normal.      Assessment/Plan:   Encounter for general adult medical examination with abnormal findings Physical exam completed.  Encourage diet and exercise.  I congratulated her on her dietary changes and starting to exercise.  She will keep that up.  Pap smear completed.  She will call to schedule her mammogram once its been 6 weeks after her Covid vaccine.  She declines tetanus vaccine today.  She will contact her insurance regarding Shingrix to see if they cover this.  I encouraged her to see a dentist and an ophthalmologist.  Lab work as outlined below.  Elevated glucose Check A1c.  Myalgia Possibly related to weight versus statin therapy.  We will  check labs as outlined below.   Orders Placed This Encounter  Procedures  . MM 3D SCREEN BREAST BILATERAL    Standing Status:   Future    Standing Expiration Date:   09/20/2020    Order Specific Question:   Reason for Exam (SYMPTOM  OR DIAGNOSIS REQUIRED)    Answer:   breast cancer screening    Order Specific Question:   Is the patient pregnant?    Answer:   No    Order Specific Question:   Preferred imaging location?    Answer:   [facility]  . HgB A1c  . CK (Creatine Kinase)  . TSH  . Comp Met (CMET)    No orders of the defined types were placed in this encounter.   This visit occurred during the SARS-CoV-2 public health emergency.  Safety protocols were in place, including screening questions prior to the visit, additional usage of staff PPE, and extensive cleaning of exam room while observing appropriate contact time as indicated for disinfecting solutions.    Tommi Rumps, MD [place]

## 2019-07-23 LAB — CYTOLOGY - PAP
Comment: NEGATIVE
Diagnosis: NEGATIVE
High risk HPV: NEGATIVE

## 2019-07-25 ENCOUNTER — Other Ambulatory Visit: Payer: Self-pay | Admitting: Family Medicine

## 2019-07-25 ENCOUNTER — Telehealth: Payer: Self-pay

## 2019-07-25 DIAGNOSIS — G459 Transient cerebral ischemic attack, unspecified: Secondary | ICD-10-CM

## 2019-07-25 DIAGNOSIS — E785 Hyperlipidemia, unspecified: Secondary | ICD-10-CM

## 2019-07-25 MED ORDER — ROSUVASTATIN CALCIUM 40 MG PO TABS: 20.0000 mg | ORAL_TABLET | Freq: Every day | ORAL | 2 refills | Status: DC

## 2019-07-25 NOTE — Telephone Encounter (Signed)
LMTCB to let patient know that she can cut 40mg  Crestor in half until she runs out then to let us know so we can send the 20mg . Patient needs to be scheduled for lab in 1 month.

## 2019-08-21 ENCOUNTER — Other Ambulatory Visit: Payer: Self-pay | Admitting: Physician Assistant

## 2019-08-21 NOTE — Telephone Encounter (Signed)
Please schedule overdue 6 month F/U for refills. Thank you!

## 2019-08-21 NOTE — Telephone Encounter (Signed)
Scheduled for tomorrow.

## 2019-08-22 ENCOUNTER — Ambulatory Visit (INDEPENDENT_AMBULATORY_CARE_PROVIDER_SITE_OTHER): Payer: BC Managed Care – PPO | Admitting: Nurse Practitioner

## 2019-08-22 ENCOUNTER — Other Ambulatory Visit: Payer: Self-pay

## 2019-08-22 ENCOUNTER — Encounter: Payer: Self-pay | Admitting: Nurse Practitioner

## 2019-08-22 VITALS — BP 123/78 | HR 65 | Ht 65.0 in | Wt 182.5 lb

## 2019-08-22 DIAGNOSIS — E782 Mixed hyperlipidemia: Secondary | ICD-10-CM | POA: Diagnosis not present

## 2019-08-22 DIAGNOSIS — Q211 Atrial septal defect: Secondary | ICD-10-CM | POA: Diagnosis not present

## 2019-08-22 DIAGNOSIS — I10 Essential (primary) hypertension: Secondary | ICD-10-CM

## 2019-08-22 DIAGNOSIS — Q2112 Patent foramen ovale: Secondary | ICD-10-CM

## 2019-08-22 MED ORDER — AMLODIPINE BESYLATE 5 MG PO TABS: 5.0000 mg | ORAL_TABLET | Freq: Every day | ORAL | 3 refills | Status: DC

## 2019-08-22 NOTE — Patient Instructions (Signed)
Medication Instructions:  - Your physician recommends that you continue on your current medications as directed. Please refer to the Current Medication list given to you today.  *If you need a refill on your cardiac medications before your next appointment, please call your pharmacy*   Lab Work: - none ordered  If you have labs (blood work) drawn today and your tests are completely normal, you will receive your results only by: Marland Kitchen MyChart Message (if you have MyChart) OR . A paper copy in the mail If you have any lab test that is abnormal or we need to change your treatment, we will call you to review the results.   Testing/Procedures: - none ordered   Follow-Up: At [facility], you and your health needs are our priority.  As part of our continuing mission to provide you with exceptional heart care, we have created designated Provider Care Teams.  These Care Teams include your primary Cardiologist (physician) and Advanced Practice Providers (APPs -  Physician Assistants and Nurse Practitioners) who all work together to provide you with the care you need, when you need it.  We recommend signing up for the patient portal called "MyChart".  Sign up information is provided on this After Visit Summary.  MyChart is used to connect with patients for Virtual Visits (Telemedicine).  Patients are able to view lab/test results, encounter notes, upcoming appointments, etc.  Non-urgent messages can be sent to your provider as well.   To learn more about what you can do with MyChart, go to [email or web address].    Your next appointment:   1 year(s)  The format for your next appointment:   In Person  Provider:    You may see Ida Rogue, MD or one of the following Advanced Practice Providers on your designated Care Team:    Murray Hodgkins, NP  Christell Faith, PA-C  Marrianne Mood, PA-C    Other Instructions n/a

## 2019-08-22 NOTE — Progress Notes (Signed)
Office Visit    Patient Name: Alyssa Matthews Date of Encounter: 08/22/2019  Primary Care Provider:  Leone Haven, MD Primary Cardiologist:  Ida Rogue, MD  Chief Complaint    59 year old female with a history of TIA/CVA, PFO, hypertension, hyperlipidemia, hyperthyroidism status post radioactive iodine, anemia, and prior tobacco abuse quitting in 2009, who presents for follow-up related to hypertension and PFO.  Past Medical History    Past Medical History:  Diagnosis Date  . Abdominal pain, left lower quadrant 06/29/2015  . Benign neoplasm of sigmoid colon   . Bilateral numbness of feet 06/29/2015  . Chronic left shoulder pain 05/17/2017  . Decreased sex drive 05/19/8586  . Dyspnea on exertion 04/18/2016  . Elevated glucose 05/18/2015  . Elevated LDL cholesterol level 06/29/2015  . Hand pain 01/01/2015  . History of blood transfusion   . History of tobacco abuse 05/18/2015  . Hypercholesteremia   . Hypertension   . Hyperthyroidism    a. s/p RAI  . Lacunar infarction ([place])    a. MRI brain 6/19: remote lacunar infarcts involving the pons  . Left ear pain 05/18/2015  . Left hip pain 01/01/2018  . Leg pain 04/16/2015  . Lipoma 04/16/2015  . PFO (patent foramen ovale)    a. TTE 6/19: EF 60-65%, no RWMA, Gr1DD, mild MR, RVSF normal, patent foramen ovale was noted with a positive saline contrast study, PASP normal; b. 09/2018 Echo: EF 55-60%, impaired relaxation. No rwma. Nl RV fxn. PFO. Triv MR.  . Prediabetes 05/18/2015  . Rib contusion, left, initial encounter 06/28/2016  . Sebaceous cyst of labia 12/05/2016  . Shoulder pain, bilateral 01/15/2015  . Special screening for malignant neoplasms, colon   . Statin intolerance 05/18/2015  . Subclinical hyperthyroidism 06/28/2016  . TIA (transient ischemic attack) 08/2017  . URI (upper respiratory infection) 05/17/2017  . Vulvar lesion 10/31/2016  . Wears dentures    partial upper  . Weight loss 10/31/2016   Past Surgical History:    Procedure Laterality Date  . BREAST CYST ASPIRATION  2015  . COLONOSCOPY WITH PROPOFOL N/A 03/30/2015   Procedure: COLONOSCOPY WITH PROPOFOL;  Surgeon: Lucilla Lame, MD;  Location: [facility];  Service: Endoscopy;  Laterality: N/A;  . DILATION AND CURETTAGE OF UTERUS    . POLYPECTOMY  03/30/2015   Procedure: POLYPECTOMY;  Surgeon: Lucilla Lame, MD;  Location: [facility];  Service: Endoscopy;;    Allergies  No Known Allergies  History of Present Illness    59 year old female with the above past medical history including TIA in June 2019 with patient reported TIA/CVA approximately 4 years prior to that with MRI in 2020 showing prior lacunar infarcts.  She also has a history of PFO, hypertension, hyperlipidemia, hyperthyroidism status post radioactive iodine, anemia, mild much regurgitation, and tobacco abuse- quitting in 2019.  In June 2019, she was admitted with left-sided facial numbness and left lower extremity numbness that subsequently resolved.  CT of the head was negative.  MRI of the brain showed remote lacunar infarct involving the pons.  Carotid ultrasound did not show any significant stenoses.  Echocardiogram showed an EF of 60-65% without regional wall motion abnormalities, grade 1 diastolic dysfunction, mild MR, and a PFO with positive saline contrast study.  PASP was normal.  She was seen by neurology and placed on aspirin this was subsequently switched to Plavix as an outpatient.  PFO was reviewed by structural heart team later in 2019 and felt that the PFO was  small but that given multiple uncontrolled risk factors for stroke, conservative therapy was recommended.  [name] was last seen via telemedicine visit in June 2020, and was doing well from a cardiac perspective.  She did report some lower extremity swelling in the setting of amlodipine therapy and this was reduced to 5 mg daily.  Chlorthalidone 12.5 mg daily was added.  Follow-up echocardiography showed  normal LV function with small PFO and trivial MR. Over the past year, she has done reasonably well.  She works full-time and was walking pretty regularly until her sister got a job and as result, she is no longer walking.  She gained about 30 pounds in the past 2 years, which she is not happy about.  She recognizes that she needs to start exercising more regularly.  Of note, she was seen in the emergency department in April in the setting of dizziness and palpitations - ? PVCs, and hypokalemia with a potassium of 2.9.  Chlorthalidone was subsequently discontinued and losartan initiated.  She has felt well since then and denies any recurrent dizziness, palpitations, chest pain, dyspnea, PND, orthopnea, syncope, edema, or early satiety.  Home Medications    Prior to Admission medications   Medication Sig Start Date End Date Taking? Authorizing Provider  amLODipine (NORVASC) 5 MG tablet TAKE 1 TABLET BY MOUTH EVERY DAY 08/21/19  Yes Theora Gianotti, NP  aspirin EC 81 MG tablet Take 81 mg by mouth daily.   Yes [provider]  clopidogrel (PLAVIX) 75 MG tablet Take 1 tablet (75 mg total) by mouth daily. 02/22/19  Yes Leone Haven, MD  fluticasone (FLONASE) 50 MCG/ACT nasal spray Place 2 sprays into both nostrils daily. 05/04/18  Yes Leone Haven, MD  losartan (COZAAR) 50 MG tablet Take 1 tablet (50 mg total) by mouth daily. 07/04/19  Yes Leone Haven, MD  Multiple Vitamin (MULTIVITAMIN WITH MINERALS) TABS tablet Take 1 tablet by mouth daily.   Yes [provider]  potassium chloride (K-DUR) 10 MEQ tablet Take 1 tablet (10 mEq total) by mouth daily. 10/08/18 08/21/28 Yes Dunn, Areta Haber, PA-C  rosuvastatin (CRESTOR) 40 MG tablet Take 0.5 tablets (20 mg total) by mouth daily. 07/25/19  Yes Leone Haven, MD  chlorthalidone (HYGROTON) 25 MG tablet Take 0.5 tablets (12.5 mg total) by mouth daily. 01/30/19 04/30/19  Leone Haven, MD    Review of Systems     Episode of dizziness and palpitations in the setting of hypokalemia in April.  Since then, she has felt well denies chest pain, dyspnea, palpitations, PND, orthopnea, dizziness, syncope, edema, or early satiety.  All other systems reviewed and are otherwise negative except as noted above.  Physical Exam    VS:  BP 123/78 (BP Location: Left Arm, Patient Position: Sitting, Cuff Size: Normal)   Pulse 65   Ht 5\' 5"  (1.651 m)   Wt 182 lb 8 oz (82.8 kg)   SpO2 98%   BMI 30.37 kg/m  , BMI Body mass index is 30.37 kg/m. GEN: Well nourished, well developed, in no acute distress. HEENT: normal. Neck: Supple, no JVD, carotid bruits, or masses. Cardiac: RRR, no murmurs, rubs, or gallops. No clubbing, cyanosis, edema.  Radials/PT 2+ and equal bilaterally.  Respiratory:  Respirations regular and unlabored, clear to auscultation bilaterally. GI: Soft, nontender, nondistended, BS + x 4. MS: no deformity or atrophy. Skin: warm and dry, no rash. Neuro:  Strength and sensation are intact. Psych: Normal affect.  Accessory Clinical  Findings    ECG personally reviewed by me today -regular sinus rhythm, 65, anterior T wave inversion- no acute changes.  Lab Results  Component Value Date   WBC 5.8 06/27/2019   HGB 11.2 (L) 06/27/2019   HCT 33.5 (L) 06/27/2019   MCV 83.8 06/27/2019   PLT 275 06/27/2019   Lab Results  Component Value Date   CREATININE 0.60 07/22/2019   BUN 14 07/22/2019   NA 141 07/22/2019   K 3.7 07/22/2019   CL 107 07/22/2019   CO2 28 07/22/2019   Lab Results  Component Value Date   ALT 17 07/22/2019   AST 17 07/22/2019   ALKPHOS 79 07/22/2019   BILITOT 0.4 07/22/2019   Lab Results  Component Value Date   CHOL 141 01/08/2019   HDL 64.90 01/08/2019   LDLCALC 58 01/08/2019   LDLDIRECT 54.0 02/01/2018   TRIG 91.0 01/08/2019   CHOLHDL 2 01/08/2019    Lab Results  Component Value Date   HGBA1C 6.3 07/22/2019    Assessment & Plan    1.  Patent foramen ovale:  Noted on echocardiography 2019 following TIA.  She was seen in our structural heart team in late 2019 and all imaging reviewed.  PFO felt to be small and given risk factors for stroke and findings of lacunar infarcts on MRI, it was felt that PFO was unlikely to have contributed.  Conservative therapy was recommended.  Follow-up echo last year again showed Doppler evidence of PFO with normal LV function.  She has done well over the past year without any neurologic symptoms.  She remains on aspirin and Plavix therapy.  2.  History of TIA/lacunar infarcts: Managed by neurology with aspirin and Plavix.  3.  Essential hypertension: Stable on amlodipine and losartan therapy.  Chlorthalidone previously discontinued in the setting of hypokalemia.  4.  Hyperlipidemia: LDL of 54 last fall with normal LFTs earlier this year.  She remains on statin therapy.  5.  Disposition: Follow-up in 1 year.  Can consider repeat echo at that time.   Murray Hodgkins, NP 08/22/2019, 10:50 AM

## 2019-08-23 ENCOUNTER — Other Ambulatory Visit (INDEPENDENT_AMBULATORY_CARE_PROVIDER_SITE_OTHER): Payer: BC Managed Care – PPO

## 2019-08-23 DIAGNOSIS — E785 Hyperlipidemia, unspecified: Secondary | ICD-10-CM | POA: Diagnosis not present

## 2019-08-23 LAB — LDL CHOLESTEROL, DIRECT: Direct LDL: 52 mg/dL

## 2019-09-02 ENCOUNTER — Ambulatory Visit
Admission: RE | Admit: 2019-09-02 | Discharge: 2019-09-02 | Disposition: A | Discharge status: Home / Self Care | Payer: BC Managed Care – PPO | Source: Ambulatory Visit | Attending: Family Medicine | Admitting: Family Medicine

## 2019-09-02 DIAGNOSIS — Z1231 Encounter for screening mammogram for malignant neoplasm of breast: Secondary | ICD-10-CM | POA: Diagnosis present

## 2019-09-03 ENCOUNTER — Other Ambulatory Visit: Payer: Self-pay | Admitting: Physician Assistant

## 2019-09-03 ENCOUNTER — Other Ambulatory Visit: Payer: Self-pay | Admitting: Family Medicine

## 2019-09-03 DIAGNOSIS — G459 Transient cerebral ischemic attack, unspecified: Secondary | ICD-10-CM

## 2019-09-03 DIAGNOSIS — E785 Hyperlipidemia, unspecified: Secondary | ICD-10-CM

## 2019-10-01 ENCOUNTER — Other Ambulatory Visit: Payer: Self-pay | Admitting: Family Medicine

## 2019-10-01 DIAGNOSIS — I10 Essential (primary) hypertension: Secondary | ICD-10-CM

## 2019-10-09 IMAGING — MR MR HEAD W/O CM
9 of 10 series · 42 of 48 positions shown · non-contrast
Comparison: CTA head neck 12/22/2017

CLINICAL DATA: Slurred speech and left lower extremity numbness.

EXAM:
MRI HEAD WITHOUT CONTRAST
TECHNIQUE: Multiplanar, multiecho pulse sequences of the brain and surrounding
structures were obtained without intravenous contrast.

[Series 2: ax dwi_tracew · axial · 3.0mm · 0.83mm/px · z∈[-106,+40]mm · 8 of 50 slices shown]
[im 1/50]
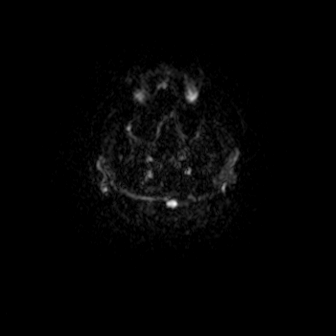
[im 8/50]
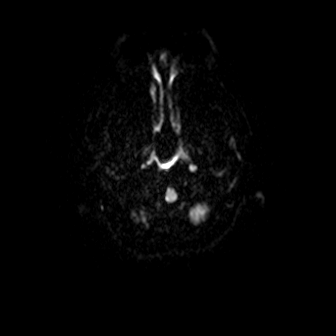
[im 15/50]
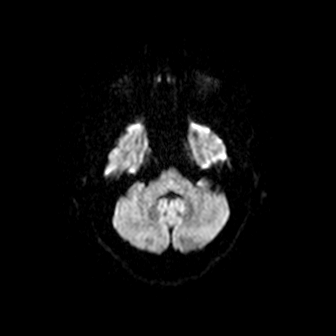
[im 22/50]
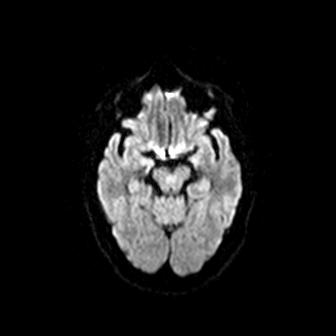
[im 29/50]
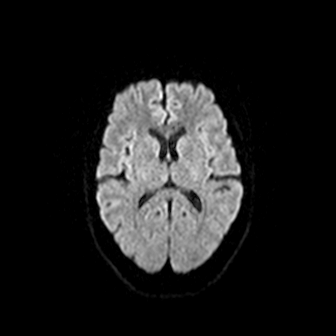
[im 36/50]
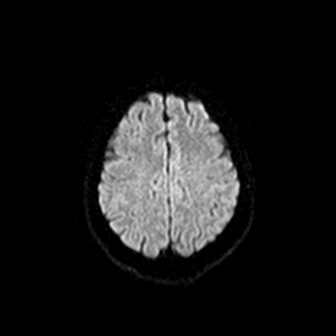
[im 43/50]
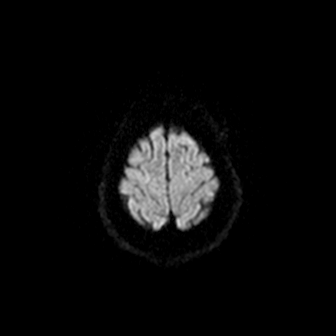
[im 50/50]
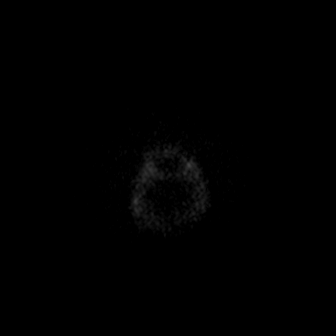

[Series 3: ax dwi_adc · axial · 3.0mm · 0.83mm/px · z∈[-106,+40]mm · 7 of 50 slices shown]
[im 1/50]
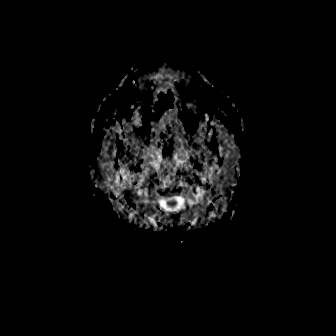
[im 9/50]
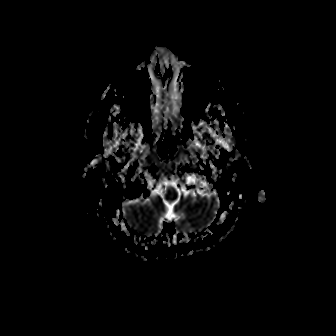
[im 17/50]
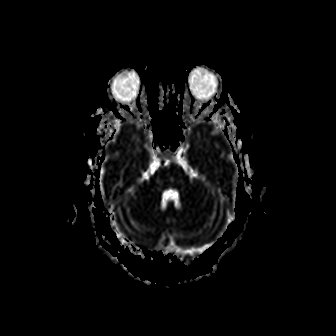
[im 25/50]
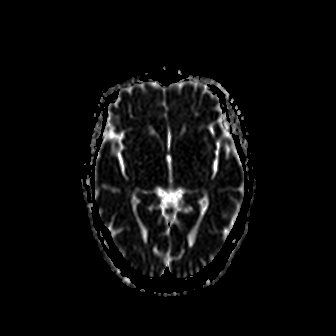
[im 33/50]
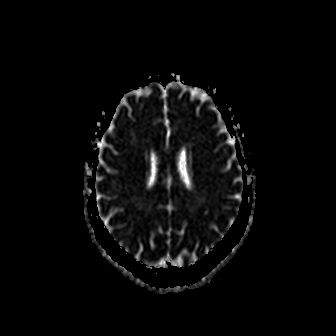
[im 41/50]
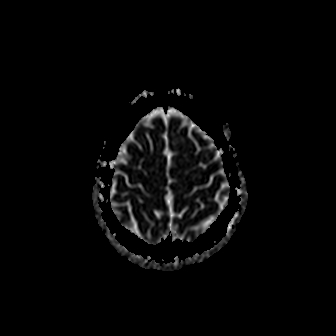
[im 50/50]
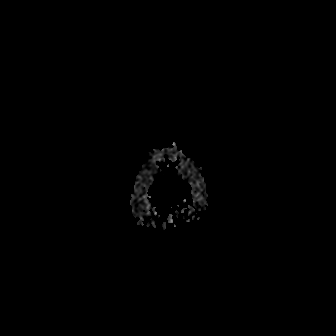

[Series 4: cor dwi_tracew · coronal · 5.0mm · 0.68mm/px · 5 of 36 slices shown]
[im 1/36]
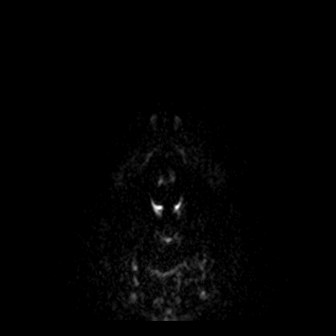
[im 9/36]
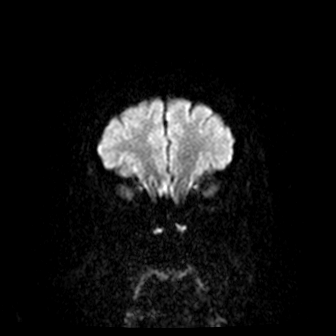
[im 18/36]
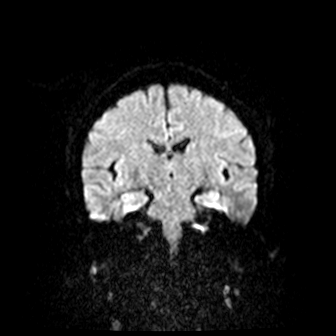
[im 27/36]
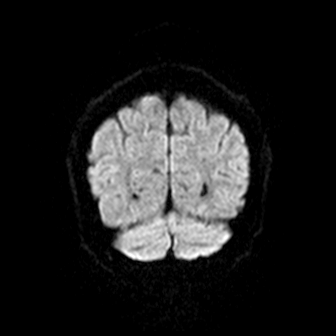
[im 36/36]
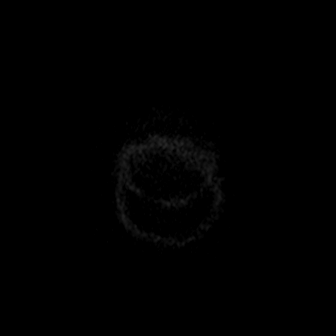

[Series 5: cor dwi_adc · coronal · 5.0mm · 0.68mm/px · 3 of 36 slices shown]
[im 1/36]
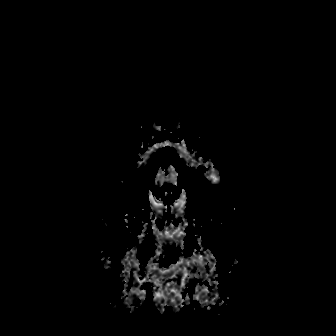
[im 9/36]
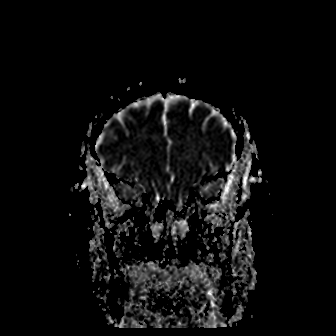
[im 18/36]
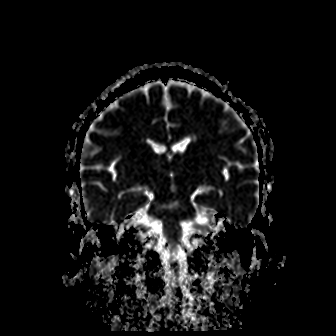

[Series 6: T1 · sagittal · 5.0mm · 0.94mm/px · 3 of 21 slices shown (1 of 2)]
[im 1/21]
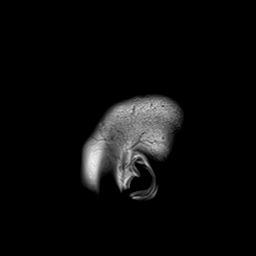
[im 11/21]
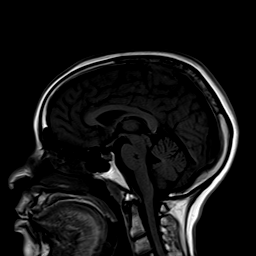
[im 21/21]
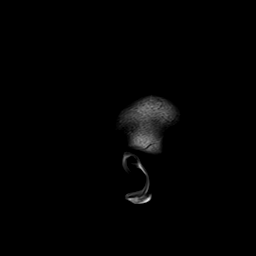

[Series 7: FLAIR · axial · 5.0mm · 1.20mm/px · z∈[-101,+42]mm · 4 of 25 slices shown]
[im 1/25]
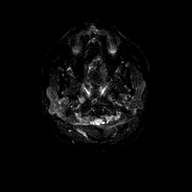
[im 9/25]
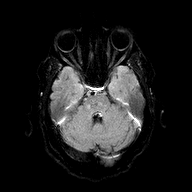
[im 17/25]
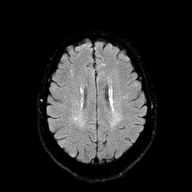
[im 25/25]
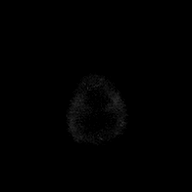

[Series 9: T2 · axial · 5.0mm · 0.45mm/px · z∈[-100,+43]mm · 4 of 25 slices shown (1 of 2)]
[im 1/25]
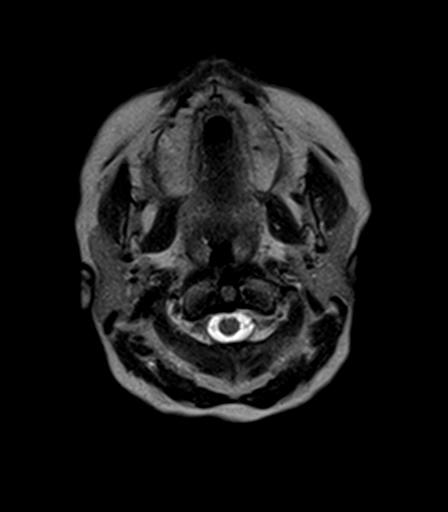
[im 9/25]
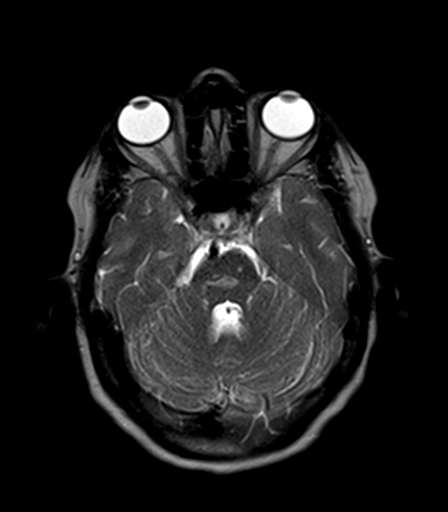
[im 17/25]
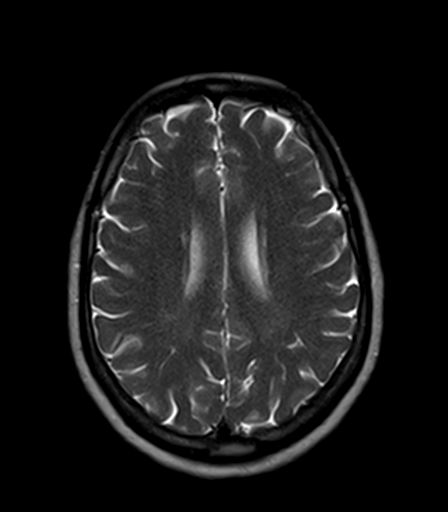
[im 25/25]
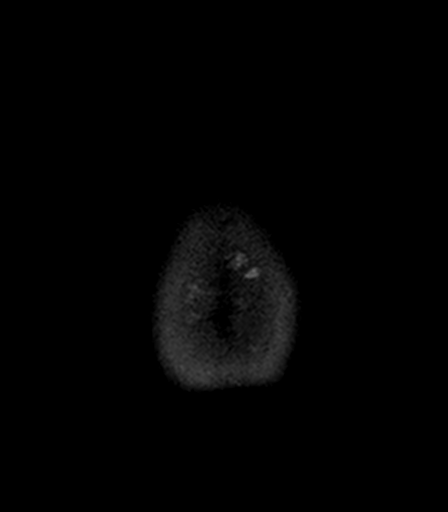

[Series 10: T1 · axial · 5.0mm · 0.90mm/px · z∈[-100,+43]mm · 4 of 25 slices shown (2 of 2)]
[im 1/25]
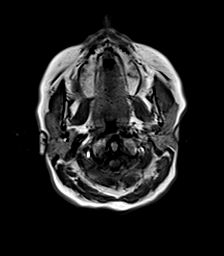
[im 9/25]
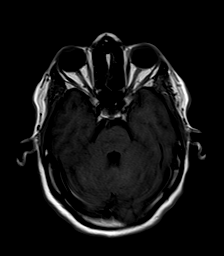
[im 17/25]
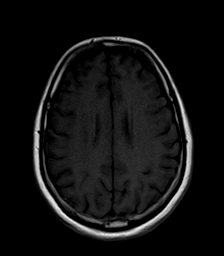
[im 25/25]
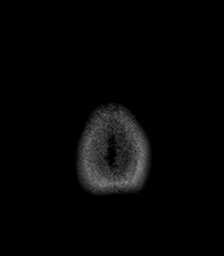

[Series 11: T2 · coronal · 5.0mm · 0.45mm/px · 4 of 29 slices shown (2 of 2)]
[im 1/29]
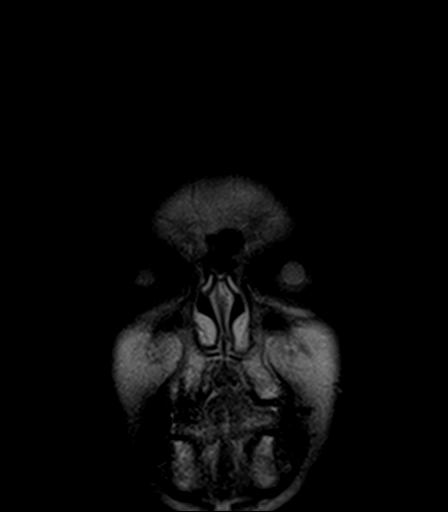
[im 10/29]
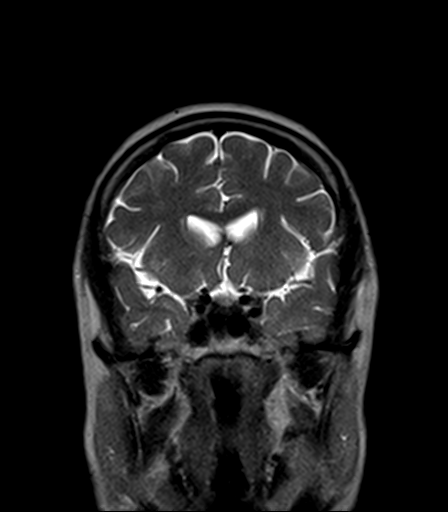
[im 19/29]
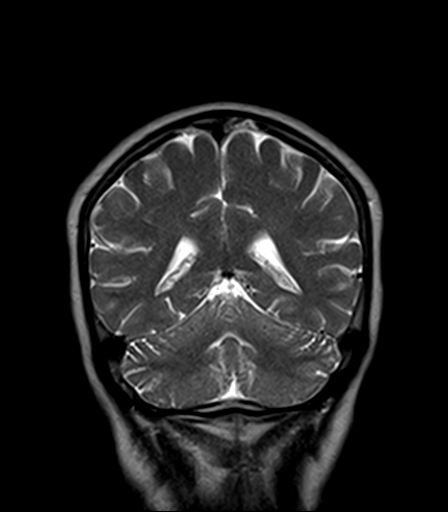
[im 29/29]
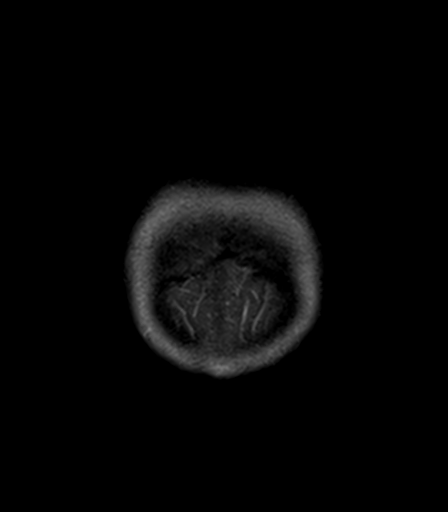

[42 of 48 positions shown; findings below may reference images not displayed]

FINDINGS: BRAIN: There is no acute infarct, acute hemorrhage, hydrocephalus or
extra-axial collection. The midline structures are normal. No
midline shift or other mass effect. There are no old infarcts.
Multifocal white matter hyperintensity, most commonly due to chronic
ischemic microangiopathy. The cerebral and cerebellar volume are
age-appropriate. Susceptibility-sensitive sequences show no chronic
microhemorrhage or superficial siderosis.

VASCULAR: Major intracranial arterial and venous sinus flow voids
are normal.

SKULL AND UPPER CERVICAL SPINE: Calvarial bone marrow signal is
normal. There is no skull base mass. Visualized upper cervical spine
and soft tissues are normal.

SINUSES/ORBITS: No fluid levels or advanced mucosal thickening. No
mastoid or middle ear effusion. The orbits are normal.
IMPRESSION: Mild chronic small vessel ischemia without acute intracranial
abnormality.

## 2019-10-21 ENCOUNTER — Other Ambulatory Visit: Payer: BC Managed Care – PPO

## 2019-10-22 ENCOUNTER — Ambulatory Visit (INDEPENDENT_AMBULATORY_CARE_PROVIDER_SITE_OTHER): Payer: BC Managed Care – PPO | Admitting: Family Medicine

## 2019-10-22 ENCOUNTER — Other Ambulatory Visit: Payer: Self-pay

## 2019-10-22 ENCOUNTER — Encounter: Payer: Self-pay | Admitting: Family Medicine

## 2019-10-22 DIAGNOSIS — R42 Dizziness and giddiness: Secondary | ICD-10-CM | POA: Diagnosis not present

## 2019-10-22 DIAGNOSIS — I10 Essential (primary) hypertension: Secondary | ICD-10-CM

## 2019-10-22 DIAGNOSIS — E785 Hyperlipidemia, unspecified: Secondary | ICD-10-CM

## 2019-10-22 DIAGNOSIS — Z8673 Personal history of transient ischemic attack (TIA), and cerebral infarction without residual deficits: Secondary | ICD-10-CM | POA: Diagnosis not present

## 2019-10-22 NOTE — Assessment & Plan Note (Signed)
Continue Crestor.  Adequately controlled.

## 2019-10-22 NOTE — Assessment & Plan Note (Addendum)
No recurrent symptoms.  She will continue Plavix and Crestor.  Advised to seek medical attention if she develops any recurrent symptoms.

## 2019-10-22 NOTE — Progress Notes (Signed)
  Tommi Rumps, MD Phone: [phone]  Alyssa Matthews is a 59 y.o. female who presents today for f/u.  HYPERTENSION  Disease Monitoring  Home BP Monitoring not checking Chest pain- no    Dyspnea- no Medications  Compliance-  Taking amlodipine, losartan.   Edema- no  History of TIA/HLD: Taking Plavix and Crestor.  No numbness, weakness, vision changes, right upper quadrant pain, or myalgias.  Lightheadedness: Patient notes this typically occurs if she has not had an upper drink or she does not have enough on her stomach.  She will sit down and it will improve.  No change in his symptoms.  She notes she only drinks about 40 ounces of water daily.    Social History   Tobacco Use  Smoking Status Former Smoker  . Packs/day: 0.75  . Years: 30.00  . Pack years: 22.50  . Types: Cigarettes  . Quit date: 08/2017  . Years since quitting: 2.1  Smokeless Tobacco Never Used     ROS see history of present illness  Objective  Physical Exam Vitals:   10/22/19 1036  BP: 118/70  Pulse: 63  Temp: 98.3 F (36.8 C)  SpO2: 99%   Laying blood pressure 134/82 pulse 56 Sitting blood pressure 133/82 pulse 56 Standing blood pressure 132/81 pulse 59  BP Readings from Last 3 Encounters:  10/22/19 118/70  08/22/19 123/78  07/22/19 110/70   Wt Readings from Last 3 Encounters:  10/22/19 179 lb 9.6 oz (81.5 kg)  08/22/19 182 lb 8 oz (82.8 kg)  07/22/19 183 lb 3.2 oz (83.1 kg)    Physical Exam Constitutional:      General: She is not in acute distress.    Appearance: She is not diaphoretic.  Cardiovascular:     Rate and Rhythm: Normal rate and regular rhythm.     Heart sounds: Normal heart sounds.  Pulmonary:     Effort: Pulmonary effort is normal.     Breath sounds: Normal breath sounds.  Musculoskeletal:     Right lower leg: No edema.     Left lower leg: No edema.  Skin:    General: Skin is warm and dry.  Neurological:     Mental Status: She is alert.       Assessment/Plan: Please see individual problem list.  Essential hypertension Well-controlled.  Continue current regimen.  Lab work up-to-date.  History of TIA (transient ischemic attack) No recurrent symptoms.  She will continue Plavix and Crestor.  Advised to seek medical attention if she develops any recurrent symptoms.  Hyperlipidemia Continue Crestor.  Adequately controlled.  Light headedness Suspect dehydration is playing a role in this.  She notes it has improved though occasionally does still have some symptoms.  Discussed increasing fluid to 60 to 80 ounces daily.  Orthostatics today.   No orders of the defined types were placed in this encounter.   No orders of the defined types were placed in this encounter.   This visit occurred during the SARS-CoV-2 public health emergency.  Safety protocols were in place, including screening questions prior to the visit, additional usage of staff PPE, and extensive cleaning of exam room while observing appropriate contact time as indicated for disinfecting solutions.    Tommi Rumps, MD [place]

## 2019-10-22 NOTE — Assessment & Plan Note (Signed)
Well-controlled.  Continue current regimen.  Lab work up-to-date.

## 2019-10-22 NOTE — Patient Instructions (Signed)
Nice to see you. Please try to increase your fluid intake to 60 to 80 ounces of water daily.  If your lightheadedness does not improve please let us know.

## 2019-10-22 NOTE — Assessment & Plan Note (Signed)
Suspect dehydration is playing a role in this.  She notes it has improved though occasionally does still have some symptoms.  Discussed increasing fluid to 60 to 80 ounces daily.  Orthostatics today.

## 2019-11-13 ENCOUNTER — Ambulatory Visit (INDEPENDENT_AMBULATORY_CARE_PROVIDER_SITE_OTHER): Payer: BC Managed Care – PPO

## 2019-11-13 ENCOUNTER — Other Ambulatory Visit: Payer: Self-pay

## 2019-11-13 DIAGNOSIS — Q211 Atrial septal defect: Secondary | ICD-10-CM | POA: Diagnosis not present

## 2019-11-13 DIAGNOSIS — Q2112 Patent foramen ovale: Secondary | ICD-10-CM

## 2019-11-13 DIAGNOSIS — I059 Rheumatic mitral valve disease, unspecified: Secondary | ICD-10-CM

## 2019-11-13 LAB — ECHOCARDIOGRAM COMPLETE
AR max vel: 3.77 cm2
AV Area VTI: 3.65 cm2
AV Area mean vel: 3.71 cm2
AV Mean grad: 3 mmHg
AV Peak grad: 6.7 mmHg
Ao pk vel: 1.29 m/s
Area-P 1/2: 3.54 cm2
Calc EF: 53.7 %
S' Lateral: 3.1 cm
Single Plane A2C EF: 55.4 %
Single Plane A4C EF: 56.2 %

## 2019-11-14 ENCOUNTER — Telehealth: Payer: Self-pay

## 2019-11-14 NOTE — Telephone Encounter (Signed)
-----   Message from Rise Mu, PA-C sent at 11/13/2019  2:15 PM EDT ----- Echo showed normal pump function, normal wall motion, and normal relaxation of the heart with no significant valvular abnormalities. Reassuring study.

## 2019-11-14 NOTE — Telephone Encounter (Signed)
Call to patient to review echo results.    Pt verbalized understanding and has no further questions at this time.    Advised pt to call for any further questions or concerns.  No further orders.   

## 2020-01-20 ENCOUNTER — Other Ambulatory Visit: Payer: Self-pay

## 2020-01-22 ENCOUNTER — Ambulatory Visit (INDEPENDENT_AMBULATORY_CARE_PROVIDER_SITE_OTHER): Payer: BC Managed Care – PPO

## 2020-01-22 ENCOUNTER — Encounter: Payer: Self-pay | Admitting: Nurse Practitioner

## 2020-01-22 ENCOUNTER — Other Ambulatory Visit: Payer: Self-pay

## 2020-01-22 ENCOUNTER — Ambulatory Visit (INDEPENDENT_AMBULATORY_CARE_PROVIDER_SITE_OTHER): Payer: BC Managed Care – PPO | Admitting: Nurse Practitioner

## 2020-01-22 VITALS — BP 104/64 | HR 76 | Temp 98.3°F | Ht 65.0 in | Wt 182.0 lb

## 2020-01-22 DIAGNOSIS — M25532 Pain in left wrist: Secondary | ICD-10-CM | POA: Diagnosis not present

## 2020-01-22 DIAGNOSIS — Z23 Encounter for immunization: Secondary | ICD-10-CM | POA: Diagnosis not present

## 2020-01-22 NOTE — Patient Instructions (Addendum)
Left wrist Xray and will call with results.   Please use the Tylenol as needed.   Wrist Pain, Adult There are many things that can cause wrist pain. Some common causes include:  An injury to the wrist area, such as a sprain, strain, or fracture.  Overuse of the joint.  A condition that causes increased pressure on a nerve in the wrist (carpal tunnel syndrome).  Wear and tear of the joints that occurs with aging (osteoarthritis).  A variety of other types of arthritis. Sometimes, the cause of wrist pain is not known. Often, the pain goes away when you follow instructions from your health care provider for relieving pain at home, such as resting or icing the wrist. If your wrist pain continues, it is important to tell your health care provider. Follow these instructions at home:  Rest the wrist area for at least 48 hours or as long as told by your health care provider.  If a splint or elastic bandage has been applied, use it as told by your health care provider. ? Remove the splint or bandage only as told by your health care provider. ? Loosen the splint or bandage if your fingers tingle, become numb, or turn cold or blue.  If directed, apply ice to the injured area. ? If you have a removable splint or elastic bandage, remove it as told by your health care provider. ? Put ice in a plastic bag. ? Place a towel between your skin and the bag or between your splint or bandage and the bag. ? Leave the ice on for 20 minutes, 2-3 times a day.   Keep your arm raised (elevated) above the level of your heart while you are sitting or lying down.  Take over-the-counter and prescription medicines only as told by your health care provider.  Keep all follow-up visits as told by your health care provider. This is important. Contact a health care provider if:  You have a sudden sharp pain in the wrist, hand, or arm that is different or new.  The swelling or bruising on your wrist or hand gets  worse.  Your skin becomes red, gets a rash, or has open sores.  Your pain does not get better or it gets worse. Get help right away if:  You lose feeling in your fingers or hand.  Your fingers turn white, very red, or cold and blue.  You cannot move your fingers.  You have a fever or chills. This information is not intended to replace advice given to you by your health care provider. Make sure you discuss any questions you have with your health care provider. Document Revised: 02/10/2017 Document Reviewed: 09/17/2015 Elsevier Patient Education  [place].

## 2020-01-22 NOTE — Progress Notes (Signed)
Established Patient Office Visit  Subjective:  Patient ID: Alyssa Matthews, female    DOB: 01/22/1961  Age: 59 y.o. MRN: [id number]  CC:  Chief Complaint  Patient presents with  . Acute Visit    left wrist pain    HPI Alyssa Matthews is a 59 yo with  well controlled HTN and presents for left wrist  pain onset about a month ago without injury or trauma. 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 She has pain along the lateral wrist radial area.  She has normal range of motion in all fingers, and wrist and it just hurts with certain twisting against pressure movements. Hurts to pick up heavy objects.  Normal strength and sensation. No elbow or arm pain.  She has not tried nonsteroidal medication d/t Plavix. She has not tried ice or heat. She has been wearing a wrist splint at nighttime and takes Tylenol as needed.  She does have a history of arthritis in other joints. She wants a flu shot today.   Past Medical History:  Diagnosis Date  . Abdominal pain, left lower quadrant 06/29/2015  . Benign neoplasm of sigmoid colon   . Bilateral numbness of feet 06/29/2015  . Chronic left shoulder pain 05/17/2017  . Decreased sex drive 4/0/3474  . Dyspnea on exertion 04/18/2016  . Elevated glucose 05/18/2015  . Elevated LDL cholesterol level 06/29/2015  . Hand pain 01/01/2015  . History of blood transfusion   . History of tobacco abuse 05/18/2015  . Hypercholesteremia   . Hypertension   . Hyperthyroidism    a. s/p RAI  . Lacunar infarction ([place])    a. MRI brain 6/19: remote lacunar infarcts involving the pons  . Left ear pain 05/18/2015  . Left hip pain 01/01/2018  . Leg pain 04/16/2015  . Lipoma 04/16/2015  . PFO (patent foramen ovale)    a. TTE 6/19: EF 60-65%, no RWMA, Gr1DD, mild MR, RVSF normal, patent foramen ovale was noted with a positive saline contrast study, PASP normal; b. 09/2018 Echo: EF 55-60%, impaired relaxation. No rwma. Nl RV fxn. PFO. Triv MR.    . Prediabetes 05/18/2015  . Rib contusion, left, initial encounter 06/28/2016  . Sebaceous cyst of labia 12/05/2016  . Shoulder pain, bilateral 01/15/2015  . Special screening for malignant neoplasms, colon   . Statin intolerance 05/18/2015  . Subclinical hyperthyroidism 06/28/2016  . TIA (transient ischemic attack) 08/2017  . URI (upper respiratory infection) 05/17/2017  . Vulvar lesion 10/31/2016  . Wears dentures    partial upper  . Weight loss 10/31/2016    Past Surgical History:  Procedure Laterality Date  . BREAST CYST ASPIRATION  2015  . COLONOSCOPY WITH PROPOFOL N/A 03/30/2015   Procedure: COLONOSCOPY WITH PROPOFOL;  Surgeon: Lucilla Lame, MD;  Location: [facility];  Service: Endoscopy;  Laterality: N/A;  . DILATION AND CURETTAGE OF UTERUS    . POLYPECTOMY  03/30/2015   Procedure: POLYPECTOMY;  Surgeon: Lucilla Lame, MD;  Location: [facility];  Service: Endoscopy;;    Family History  Problem Relation Age of Onset  . Hypertension Mother   . Kidney disease Brother   . Cancer Father        In neck  . Drug abuse Paternal Grandmother   . Hypertension Brother   . Breast cancer Neg Hx   . Thyroid disease Neg Hx     Social History   Socioeconomic History  .  Marital status: Married    Spouse name: Not on file  . Number of children: Not on file  . Years of education: Not on file  . Highest education level: Not on file  Occupational History  . Not on file  Tobacco Use  . Smoking status: Former Smoker    Packs/day: 0.75    Years: 30.00    Pack years: 22.50    Types: Cigarettes    Quit date: 08/2017    Years since quitting: 2.4  . Smokeless tobacco: Never Used  Vaping Use  . Vaping Use: Never used  Substance and Sexual Activity  . Alcohol use: Not Currently    Alcohol/week: 0.0 standard drinks  . Drug use: No  . Sexual activity: Yes  Other Topics Concern  . Not on file  Social History Narrative   Patient works on an Designer, television/film set.   Social  Determinants of Health   Financial Resource Strain:   . Difficulty of Paying Living Expenses: Not on file  Food Insecurity:   . Worried About Charity fundraiser in the Last Year: Not on file  . Ran Out of Food in the Last Year: Not on file  Transportation Needs:   . Lack of Transportation (Medical): Not on file  . Lack of Transportation (Non-Medical): Not on file  Physical Activity:   . Days of Exercise per Week: Not on file  . Minutes of Exercise per Session: Not on file  Stress:   . Feeling of Stress : Not on file  Social Connections:   . Frequency of Communication with Friends and Family: Not on file  . Frequency of Social Gatherings with Friends and Family: Not on file  . Attends Religious Services: Not on file  . Active Member of Clubs or Organizations: Not on file  . Attends Archivist Meetings: Not on file  . Marital Status: Not on file  Intimate Partner Violence:   . Fear of Current or Ex-Partner: Not on file  . Emotionally Abused: Not on file  . Physically Abused: Not on file  . Sexually Abused: Not on file    Outpatient Medications Prior to Visit  Medication Sig Dispense Refill  . amLODipine (NORVASC) 5 MG tablet Take 1 tablet (5 mg total) by mouth daily. 90 tablet 3  . aspirin EC 81 MG tablet Take 81 mg by mouth daily.    . clopidogrel (PLAVIX) 75 MG tablet Take 1 tablet (75 mg total) by mouth daily. 90 tablet 1  . losartan (COZAAR) 50 MG tablet TAKE 1 TABLET BY MOUTH EVERY DAY 90 tablet 1  . Multiple Vitamin (MULTIVITAMIN WITH MINERALS) TABS tablet Take 1 tablet by mouth daily.    . potassium chloride (KLOR-CON) 10 MEQ tablet TAKE 1 TABLET BY MOUTH EVERY DAY 90 tablet 3  . rosuvastatin (CRESTOR) 40 MG tablet TAKE 1 TABLET BY MOUTH EVERY DAY 90 tablet 2  . chlorthalidone (HYGROTON) 25 MG tablet Take 12.5 mg by mouth daily.    . fluticasone (FLONASE) 50 MCG/ACT nasal spray Place 2 sprays into both nostrils daily. 16 g 6   No facility-administered  medications prior to visit.    No Known Allergies  Review of Systems  Constitutional: Negative.   Musculoskeletal: Negative for joint swelling.       Left wrist pain  Skin: Negative for color change, rash and wound.      Objective:    Physical Exam Vitals reviewed.  Constitutional:      Appearance:  She is normal weight.  HENT:     Head: Normocephalic and atraumatic.  Musculoskeletal:        General: Swelling and tenderness present. No deformity or signs of injury. Normal range of motion.     Left upper arm: Normal.     Left elbow: Normal.     Left forearm: Normal.     Right wrist: Normal.     Left wrist: Swelling, tenderness and bony tenderness present. No deformity, effusion, snuff box tenderness or crepitus. Normal range of motion.       Arms:     Cervical back: Normal range of motion and neck supple.     Right lower leg: No edema.     Left lower leg: No edema.     Comments: Negative Tinel's.   Skin:    General: Skin is warm and dry.  Neurological:     Mental Status: She is alert.     Motor: Weakness present.     Comments: Left grasp is slightly weaker than right-baseline since past stroke.   Psychiatric:        Mood and Affect: Mood normal.        Behavior: Behavior normal.     BP 104/64 (BP Location: Left Arm, Patient Position: Sitting, Cuff Size: Normal)   Pulse 76   Temp 98.3 F (36.8 C) (Oral)   Ht 5\' 5"  (1.651 m)   Wt 182 lb (82.6 kg)   SpO2 98%   BMI 30.29 kg/m  Wt Readings from Last 3 Encounters:  01/22/20 182 lb (82.6 kg)  10/22/19 179 lb 9.6 oz (81.5 kg)  08/22/19 182 lb 8 oz (82.8 kg)   Pulse Readings from Last 3 Encounters:  01/22/20 76  10/22/19 63  08/22/19 65    BP Readings from Last 3 Encounters:  01/22/20 104/64  10/22/19 118/70  08/22/19 123/78    Lab Results  Component Value Date   CHOL 141 01/08/2019   HDL 64.90 01/08/2019   LDLCALC 58 01/08/2019   LDLDIRECT 52.0 08/23/2019   TRIG 91.0 01/08/2019   CHOLHDL 2 01/08/2019       Health Maintenance Due  Topic Date Due  . TETANUS/TDAP  Never done    There are no preventive care reminders to display for this patient.  Lab Results  Component Value Date   TSH 0.36 07/22/2019   Lab Results  Component Value Date   WBC 5.8 06/27/2019   HGB 11.2 (L) 06/27/2019   HCT 33.5 (L) 06/27/2019   MCV 83.8 06/27/2019   PLT 275 06/27/2019   Lab Results  Component Value Date   NA 141 07/22/2019   K 3.7 07/22/2019   CO2 28 07/22/2019   GLUCOSE 98 07/22/2019   BUN 14 07/22/2019   CREATININE 0.60 07/22/2019   BILITOT 0.4 07/22/2019   ALKPHOS 79 07/22/2019   AST 17 07/22/2019   ALT 17 07/22/2019   PROT 6.7 07/22/2019   ALBUMIN 4.1 07/22/2019   CALCIUM 9.1 07/22/2019   ANIONGAP 10 06/27/2019   GFR 124.77 07/22/2019      Assessment & Plan:   Problem List Items Addressed This Visit      Other   Left wrist pain - Primary   Relevant Orders   DG Wrist Complete Left (Completed)    Other Visit Diagnoses    Need for immunization against influenza       Relevant Orders   Flu Vaccine QUAD 36+ mos IM (Completed)      Left  wrist Xray and will call with results. Suspect tendonitis.   Please use the Tylenol as needed. May wear cock up splint that she has at home as she is doing. Apply ice as recommended in AVS instructions. Declines ortho referral for joint injection.Reports she is advised agst taking NSAIDs with Plavix. HTN is well controlled. Normal renal function in May.    Follow-up: Return if symptoms worsen or fail to improve.  This visit occurred during the SARS-CoV-2 public health emergency.  Safety protocols were in place, including screening questions prior to the visit, additional usage of staff PPE, and extensive cleaning of exam room while observing appropriate contact time as indicated for disinfecting solutions.    Denice Paradise, NP

## 2020-02-04 ENCOUNTER — Other Ambulatory Visit: Payer: Self-pay | Admitting: Family Medicine

## 2020-02-25 ENCOUNTER — Telehealth (INDEPENDENT_AMBULATORY_CARE_PROVIDER_SITE_OTHER): Payer: BC Managed Care – PPO | Admitting: Family Medicine

## 2020-02-25 ENCOUNTER — Other Ambulatory Visit: Payer: Self-pay

## 2020-02-25 ENCOUNTER — Encounter: Payer: Self-pay | Admitting: Family Medicine

## 2020-02-25 DIAGNOSIS — I10 Essential (primary) hypertension: Secondary | ICD-10-CM | POA: Diagnosis not present

## 2020-02-25 DIAGNOSIS — E785 Hyperlipidemia, unspecified: Secondary | ICD-10-CM | POA: Diagnosis not present

## 2020-02-25 DIAGNOSIS — M25561 Pain in right knee: Secondary | ICD-10-CM | POA: Diagnosis not present

## 2020-02-25 DIAGNOSIS — M25532 Pain in left wrist: Secondary | ICD-10-CM | POA: Diagnosis not present

## 2020-02-25 NOTE — Assessment & Plan Note (Signed)
Possible deQuervain tenosynovitis. Discussed icing with an ice cube for up to 10 minutes 3 times per day. If not continuing to improve over the next week she will let us know and we can refer to orthopedics.

## 2020-02-25 NOTE — Progress Notes (Signed)
Virtual Visit via telephone Note  This visit type was conducted due to national recommendations for restrictions regarding the COVID-19 pandemic (e.g. social distancing).  This format is felt to be most appropriate for this patient at this time.  All issues noted in this document were discussed and addressed.  No physical exam was performed (except for noted visual exam findings with Video Visits).   I connected with Alyssa Matthews today at  9:30 AM EST by telephone and verified that I am speaking with the correct person using two identifiers. Location patient: home Location provider: home office Persons participating in the virtual visit: patient, provider  I discussed the limitations, risks, security and privacy concerns of performing an evaluation and management service by telephone and the availability of in person appointments. I also discussed with the patient that there may be a patient responsible charge related to this service. The patient expressed understanding and agreed to proceed.  Interactive audio and video telecommunications were attempted between this provider and patient, however failed, due to patient having technical difficulties OR patient did not have access to video capability.  We continued and completed visit with audio only.   Reason for visit: f/u  HPI: HYPERTENSION  Disease Monitoring  Home BP Monitoring not checking Chest pain- no    Dyspnea- no  Medications  Compliance-  Taking amlodipine, losartan.  Edema- no  HYPERLIPIDEMIA Symptoms Chest pain on exertion:  no    Medications: Compliance- taking crestor Right upper quadrant pain- no  Muscle aches- no  Left wrist: improved from her visit with kim. She has been using heat and ice though not consistently. Notes it hurts when she moves it a certain direction. Hurts over the radial aspect. Does not hurt while she is at work. Notes the pain has gone from an 8/10 to 3/10.  Right knee pain: one week duration.  Was significant last week and is now 80% better. Had swelling though that has improved. Now hurts when she gets up from seated position. Notes it pops and catches. No locking. She tried bengay with good benefit. voltaren gel was not as helpful. Denies injury.     ROS: See pertinent positives and negatives per HPI.  Past Medical History:  Diagnosis Date  . Abdominal pain, left lower quadrant 06/29/2015  . Benign neoplasm of sigmoid colon   . Bilateral numbness of feet 06/29/2015  . Chronic left shoulder pain 05/17/2017  . Decreased sex drive 10/16/6312  . Dyspnea on exertion 04/18/2016  . Elevated glucose 05/18/2015  . Elevated LDL cholesterol level 06/29/2015  . Hand pain 01/01/2015  . History of blood transfusion   . History of tobacco abuse 05/18/2015  . Hypercholesteremia   . Hypertension   . Hyperthyroidism    a. s/p RAI  . Lacunar infarction ([place])    a. MRI brain 6/19: remote lacunar infarcts involving the pons  . Left ear pain 05/18/2015  . Left hip pain 01/01/2018  . Leg pain 04/16/2015  . Lipoma 04/16/2015  . PFO (patent foramen ovale)    a. TTE 6/19: EF 60-65%, no RWMA, Gr1DD, mild MR, RVSF normal, patent foramen ovale was noted with a positive saline contrast study, PASP normal; b. 09/2018 Echo: EF 55-60%, impaired relaxation. No rwma. Nl RV fxn. PFO. Triv MR.  . Prediabetes 05/18/2015  . Rib contusion, left, initial encounter 06/28/2016  . Sebaceous cyst of labia 12/05/2016  . Shoulder pain, bilateral 01/15/2015  . Special screening for malignant neoplasms, colon   .  Statin intolerance 05/18/2015  . Subclinical hyperthyroidism 06/28/2016  . TIA (transient ischemic attack) 08/2017  . URI (upper respiratory infection) 05/17/2017  . Vulvar lesion 10/31/2016  . Wears dentures    partial upper  . Weight loss 10/31/2016    Past Surgical History:  Procedure Laterality Date  . BREAST CYST ASPIRATION  2015  . COLONOSCOPY WITH PROPOFOL N/A 03/30/2015   Procedure: COLONOSCOPY WITH PROPOFOL;   Surgeon: Lucilla Lame, MD;  Location: [facility];  Service: Endoscopy;  Laterality: N/A;  . DILATION AND CURETTAGE OF UTERUS    . POLYPECTOMY  03/30/2015   Procedure: POLYPECTOMY;  Surgeon: Lucilla Lame, MD;  Location: [facility];  Service: Endoscopy;;    Family History  Problem Relation Age of Onset  . Hypertension Mother   . Kidney disease Brother   . Cancer Father        In neck  . Drug abuse Paternal Grandmother   . Hypertension Brother   . Breast cancer Neg Hx   . Thyroid disease Neg Hx     SOCIAL HX: former smoker   Current Outpatient Medications:  .  amLODipine (NORVASC) 5 MG tablet, Take 1 tablet (5 mg total) by mouth daily., Disp: 90 tablet, Rfl: 3 .  aspirin EC 81 MG tablet, Take 81 mg by mouth daily., Disp: , Rfl:  .  clopidogrel (PLAVIX) 75 MG tablet, Take 1 tablet (75 mg total) by mouth daily., Disp: 90 tablet, Rfl: 1 .  losartan (COZAAR) 50 MG tablet, TAKE 1 TABLET BY MOUTH EVERY DAY, Disp: 90 tablet, Rfl: 1 .  Multiple Vitamin (MULTIVITAMIN WITH MINERALS) TABS tablet, Take 1 tablet by mouth daily., Disp: , Rfl:  .  potassium chloride (KLOR-CON) 10 MEQ tablet, TAKE 1 TABLET BY MOUTH EVERY DAY, Disp: 90 tablet, Rfl: 3 .  rosuvastatin (CRESTOR) 40 MG tablet, TAKE 1 TABLET BY MOUTH EVERY DAY, Disp: 90 tablet, Rfl: 2  EXAM: This was a telephone visit and thus no exam was completed.   ASSESSMENT AND PLAN:  Discussed the following assessment and plan:  Problem List Items Addressed This Visit    Essential hypertension    Undetermined control. She will come in for a nurse BP check and labs. Continue amlodipine and losartan.       Relevant Orders   Basic Metabolic Panel (BMET)   Hyperlipidemia    Continue crestor.       Relevant Orders   Lipid panel   Left wrist pain    Possible deQuervain tenosynovitis. Discussed icing with an ice cube for up to 10 minutes 3 times per day. If not continuing to improve over the next week she will let us know and  we can refer to orthopedics.        Right knee pain    Concern for degenerative meniscal issue. We will obtain an x-ray. She can ice. Consider referral to orthopedics once her x-ray returns.       Relevant Orders   DG Knee Complete 4 Views Right       I discussed the assessment and treatment plan with the patient. The patient was provided an opportunity to ask questions and all were answered. The patient agreed with the plan and demonstrated an understanding of the instructions.   The patient was advised to call back or seek an in-person evaluation if the symptoms worsen or if the condition fails to improve as anticipated.  I provided 14 minutes of non-face-to-face time during this encounter.   Randall Hiss  Caryl Bis, MD

## 2020-02-25 NOTE — Assessment & Plan Note (Signed)
Undetermined control. She will come in for a nurse BP check and labs. Continue amlodipine and losartan.

## 2020-02-25 NOTE — Assessment & Plan Note (Signed)
Continue crestor 

## 2020-02-25 NOTE — Assessment & Plan Note (Signed)
Concern for degenerative meniscal issue. We will obtain an x-ray. She can ice. Consider referral to orthopedics once her x-ray returns.

## 2020-03-03 ENCOUNTER — Other Ambulatory Visit (INDEPENDENT_AMBULATORY_CARE_PROVIDER_SITE_OTHER): Payer: BC Managed Care – PPO

## 2020-03-03 ENCOUNTER — Ambulatory Visit: Payer: BC Managed Care – PPO

## 2020-03-03 ENCOUNTER — Ambulatory Visit (INDEPENDENT_AMBULATORY_CARE_PROVIDER_SITE_OTHER): Payer: BC Managed Care – PPO

## 2020-03-03 ENCOUNTER — Other Ambulatory Visit: Payer: Self-pay

## 2020-03-03 DIAGNOSIS — E785 Hyperlipidemia, unspecified: Secondary | ICD-10-CM | POA: Diagnosis not present

## 2020-03-03 DIAGNOSIS — M25561 Pain in right knee: Secondary | ICD-10-CM | POA: Diagnosis not present

## 2020-03-03 DIAGNOSIS — I10 Essential (primary) hypertension: Secondary | ICD-10-CM | POA: Diagnosis not present

## 2020-03-03 LAB — LIPID PANEL
Cholesterol: 133 mg/dL (ref 0–200)
HDL: 67.4 mg/dL (ref >39.00)
LDL Cholesterol: 51 mg/dL (ref 0–99)
NonHDL: 65.23
Total CHOL/HDL Ratio: 2
Triglycerides: 72 mg/dL (ref 0.0–149.0)
VLDL: 14.4 mg/dL (ref 0.0–40.0)

## 2020-03-03 LAB — BASIC METABOLIC PANEL
BUN: 18 mg/dL (ref 6–23)
CO2: 28 mEq/L (ref 19–32)
Calcium: 9.1 mg/dL (ref 8.4–10.5)
Chloride: 105 mEq/L (ref 96–112)
Creatinine, Ser: 0.65 mg/dL (ref 0.40–1.20)
GFR: 97.74 mL/min (ref >60.00)
Glucose, Bld: 100 mg/dL — ABNORMAL HIGH (ref 70–99)
Potassium: 3.9 mEq/L (ref 3.5–5.1)
Sodium: 139 mEq/L (ref 135–145)

## 2020-03-09 ENCOUNTER — Other Ambulatory Visit: Payer: Self-pay | Admitting: Family Medicine

## 2020-03-09 DIAGNOSIS — M25561 Pain in right knee: Secondary | ICD-10-CM

## 2020-03-10 ENCOUNTER — Ambulatory Visit: Payer: BC Managed Care – PPO

## 2020-06-12 ENCOUNTER — Other Ambulatory Visit: Payer: Self-pay | Admitting: Family Medicine

## 2020-06-12 DIAGNOSIS — I10 Essential (primary) hypertension: Secondary | ICD-10-CM

## 2020-06-18 ENCOUNTER — Emergency Department: Payer: BC Managed Care – PPO

## 2020-06-18 ENCOUNTER — Telehealth: Payer: Self-pay | Admitting: Family Medicine

## 2020-06-18 ENCOUNTER — Emergency Department
Admission: EM | Admit: 2020-06-18 | Discharge: 2020-06-18 | Disposition: A | Discharge status: Home / Self Care | Payer: BC Managed Care – PPO | Attending: Emergency Medicine | Admitting: Emergency Medicine

## 2020-06-18 ENCOUNTER — Other Ambulatory Visit: Payer: Self-pay

## 2020-06-18 DIAGNOSIS — M79661 Pain in right lower leg: Secondary | ICD-10-CM

## 2020-06-18 DIAGNOSIS — Z87891 Personal history of nicotine dependence: Secondary | ICD-10-CM | POA: Diagnosis not present

## 2020-06-18 DIAGNOSIS — Z7902 Long term (current) use of antithrombotics/antiplatelets: Secondary | ICD-10-CM | POA: Insufficient documentation

## 2020-06-18 DIAGNOSIS — M7989 Other specified soft tissue disorders: Secondary | ICD-10-CM | POA: Diagnosis present

## 2020-06-18 DIAGNOSIS — M79605 Pain in left leg: Secondary | ICD-10-CM

## 2020-06-18 DIAGNOSIS — Z79899 Other long term (current) drug therapy: Secondary | ICD-10-CM | POA: Diagnosis not present

## 2020-06-18 DIAGNOSIS — Z85038 Personal history of other malignant neoplasm of large intestine: Secondary | ICD-10-CM | POA: Insufficient documentation

## 2020-06-18 DIAGNOSIS — Z7982 Long term (current) use of aspirin: Secondary | ICD-10-CM | POA: Diagnosis not present

## 2020-06-18 DIAGNOSIS — M79662 Pain in left lower leg: Secondary | ICD-10-CM | POA: Insufficient documentation

## 2020-06-18 DIAGNOSIS — I10 Essential (primary) hypertension: Secondary | ICD-10-CM | POA: Insufficient documentation

## 2020-06-18 DIAGNOSIS — M79609 Pain in unspecified limb: Secondary | ICD-10-CM

## 2020-06-18 MED ORDER — MELOXICAM 7.5 MG PO TABS: 7.5000 mg | ORAL_TABLET | Freq: Every day | ORAL | 2 refills | Status: DC

## 2020-06-18 NOTE — ED Triage Notes (Signed)
Pt states last night both legs felt tight and were swollen in the calves. Pt states she used icy hot and right leg improved but left leg has stayed the same. Pt with mild swelling to left calf, no swelling noted in ankles. Pt was prescribed lasix but has not started taking it yet, pt states she is on blood thinners.

## 2020-06-18 NOTE — ED Provider Notes (Signed)
[facility] Emergency Department Provider Note   ____________________________________________   Event Date/Time   First MD Initiated Contact with Patient 06/18/20 1314     (approximate)  I have reviewed the triage vital signs and the nursing notes.   HISTORY  Chief Complaint Leg Swelling   HPI Alyssa Matthews is a 60 y.o. female presents to the ED with complaint of bilateral leg swelling that began last evening.  Patient states that the left one is still swollen but the right one improved after she used icy hot on her leg.  Patient states that she had a bruise on the posterior portion of her leg without known injury.  She called her PCP today who sent her to the emergency department for evaluation of a DVT.  Patient denies any previous history of DVT.  She has recently started a walking program and went from 1 mile a day to the next week walking 2 miles per day.  She denies any injury but questions whether she may have pulled a muscle causing her legs to hurt.  She rates her pain as 4 out of 10.      Past Medical History:  Diagnosis Date  . Abdominal pain, left lower quadrant 06/29/2015  . Benign neoplasm of sigmoid colon   . Bilateral numbness of feet 06/29/2015  . Chronic left shoulder pain 05/17/2017  . Decreased sex drive 11/19/3530  . Dyspnea on exertion 04/18/2016  . Elevated glucose 05/18/2015  . Elevated LDL cholesterol level 06/29/2015  . Hand pain 01/01/2015  . History of blood transfusion   . History of tobacco abuse 05/18/2015  . Hypercholesteremia   . Hypertension   . Hyperthyroidism    a. s/p RAI  . Lacunar infarction ([place])    a. MRI brain 6/19: remote lacunar infarcts involving the pons  . Left ear pain 05/18/2015  . Left hip pain 01/01/2018  . Leg pain 04/16/2015  . Lipoma 04/16/2015  . PFO (patent foramen ovale)    a. TTE 6/19: EF 60-65%, no RWMA, Gr1DD, mild MR, RVSF normal, patent foramen ovale was noted with a positive saline contrast  study, PASP normal; b. 09/2018 Echo: EF 55-60%, impaired relaxation. No rwma. Nl RV fxn. PFO. Triv MR.  . Prediabetes 05/18/2015  . Rib contusion, left, initial encounter 06/28/2016  . Sebaceous cyst of labia 12/05/2016  . Shoulder pain, bilateral 01/15/2015  . Special screening for malignant neoplasms, colon   . Statin intolerance 05/18/2015  . Subclinical hyperthyroidism 06/28/2016  . TIA (transient ischemic attack) 08/2017  . URI (upper respiratory infection) 05/17/2017  . Vulvar lesion 10/31/2016  . Wears dentures    partial upper  . Weight loss 10/31/2016    Patient Active Problem List   Diagnosis Date Noted  . Right knee pain 02/25/2020  . Left wrist pain 01/22/2020  . Myalgia 07/22/2019  . Hypokalemia 07/02/2019  . Right foot pain 01/08/2019  . Hyperlipidemia 09/03/2018  . Anemia 09/03/2018  . Light headedness 07/27/2018  . Overweight 05/04/2018  . Left hip pain 01/01/2018  . PFO (patent foramen ovale) 09/15/2017  . History of TIA (transient ischemic attack) 08/30/2017  . Chronic left shoulder pain 05/17/2017  . Sebaceous cyst of labia 12/05/2016  . Subclinical hyperthyroidism 06/28/2016  . Dyspnea on exertion 04/18/2016  . Bilateral numbness of feet 06/29/2015  . Elevated glucose 05/18/2015  . Tobacco abuse 05/18/2015  . Prediabetes 05/18/2015  . Lipoma 04/16/2015  . Benign neoplasm of sigmoid colon   .  Shoulder pain, bilateral 01/15/2015  . Essential hypertension 01/01/2015    Past Surgical History:  Procedure Laterality Date  . BREAST CYST ASPIRATION  2015  . COLONOSCOPY WITH PROPOFOL N/A 03/30/2015   Procedure: COLONOSCOPY WITH PROPOFOL;  Surgeon: Lucilla Lame, MD;  Location: [place];  Service: Endoscopy;  Laterality: N/A;  . DILATION AND CURETTAGE OF UTERUS    . POLYPECTOMY  03/30/2015   Procedure: POLYPECTOMY;  Surgeon: Lucilla Lame, MD;  Location: [place];  Service: Endoscopy;;    Prior to Admission medications   Medication Sig Start Date  End Date Taking? Authorizing Provider  meloxicam (MOBIC) 7.5 MG tablet Take 1 tablet (7.5 mg total) by mouth daily. With food 06/18/20 06/18/21 Yes Jahnyla Parrillo L, PA-C  amLODipine (NORVASC) 5 MG tablet Take 1 tablet (5 mg total) by mouth daily. 08/22/19   Theora Gianotti, NP  aspirin EC 81 MG tablet Take 81 mg by mouth daily.    [provider]  clopidogrel (PLAVIX) 75 MG tablet Take 1 tablet (75 mg total) by mouth daily. 02/22/19   Leone Haven, MD  losartan (COZAAR) 50 MG tablet TAKE 1 TABLET BY MOUTH EVERY DAY 06/12/20   Leone Haven, MD  Multiple Vitamin (MULTIVITAMIN WITH MINERALS) TABS tablet Take 1 tablet by mouth daily.    [provider]  potassium chloride (KLOR-CON) 10 MEQ tablet TAKE 1 TABLET BY MOUTH EVERY DAY 09/03/19   Rise Mu, PA-C  rosuvastatin (CRESTOR) 40 MG tablet TAKE 1 TABLET BY MOUTH EVERY DAY 09/03/19   Leone Haven, MD    Allergies Patient has no known allergies.  Family History  Problem Relation Age of Onset  . Hypertension Mother   . Kidney disease Brother   . Cancer Father        In neck  . Drug abuse Paternal Grandmother   . Hypertension Brother   . Breast cancer Neg Hx   . Thyroid disease Neg Hx     Social History Social History   Tobacco Use  . Smoking status: Former Smoker    Packs/day: 0.75    Years: 30.00    Pack years: 22.50    Types: Cigarettes    Quit date: 08/2017    Years since quitting: 2.8  . Smokeless tobacco: Never Used  Vaping Use  . Vaping Use: Never used  Substance Use Topics  . Alcohol use: Not Currently    Alcohol/week: 0.0 standard drinks  . Drug use: No    Review of Systems Constitutional: No fever/chills Eyes: No visual changes. ENT: No sore throat. Cardiovascular: Denies chest pain. Respiratory: Denies shortness of breath. Gastrointestinal: No abdominal pain.  No nausea, no vomiting.  Musculoskeletal: Positive bilateral leg pain. Skin: Negative for  rash. Neurological: Negative for headaches, focal weakness or numbness.  ____________________________________________   PHYSICAL EXAM:  VITAL SIGNS: ED Triage Vitals  Enc Vitals Group     BP 06/18/20 1201 136/74     Pulse Rate 06/18/20 1201 74     Resp 06/18/20 1201 15     Temp 06/18/20 1201 98.4 F (36.9 C)     Temp Source 06/18/20 1201 Oral     SpO2 06/18/20 1201 98 %     Weight 06/18/20 1211 180 lb (81.6 kg)     Height 06/18/20 1211 5\' 4"  (1.626 m)     Head Circumference --      Peak Flow --      Pain Score 06/18/20 1211 4  Pain Loc --      Pain Edu? --      Excl. in [place]? --     Constitutional: Alert and oriented. Well appearing and in no acute distress. Eyes: Conjunctivae are normal.  Head: Atraumatic. Neck: No stridor.   Cardiovascular: Normal rate, regular rhythm. Grossly normal heart sounds.  Good peripheral circulation. Respiratory: Normal respiratory effort.  No retractions. Lungs CTAB. Musculoskeletal: Examination of the lower extremities are no gross deformity.  There is diffuse tenderness on palpation of the left calf.  No erythema or warmth is noted.  Alyssa Matthews is negative.  Good muscle strength bilaterally.  Skin is intact and no discoloration or abrasions are noted.  No bruises are seen.  No effusion noted around patella bilaterally.  No lower extremity edema present.  Good pulses bilaterally both DP and PT.  Motor sensory function intact to digits bilaterally. Neurologic:  Normal speech and language. No gross focal neurologic deficits are appreciated. No gait instability. Skin:  Skin is warm, dry and intact. No rash noted. Psychiatric: Mood and affect are normal. Speech and behavior are normal.  ____________________________________________   LABS (all labs ordered are listed, but only abnormal results are displayed)  Labs Reviewed - No data to display ____________________________________________  RADIOLOGY I, Johnn Hai, personally viewed and  evaluated these images (plain radiographs) as part of my medical decision making, as well as reviewing the written report by the radiologist.   Official radiology report(s): US Venous Img Lower Bilateral (DVT)  Result Date: 06/18/2020 CLINICAL DATA:  60 year old female with bilateral calf pain for 1 day. EXAM: BILATERAL LOWER EXTREMITY VENOUS DOPPLER ULTRASOUND TECHNIQUE: Gray-scale sonography with graded compression, as well as color Doppler and duplex ultrasound were performed to evaluate the lower extremity deep venous systems from the level of the common femoral vein and including the common femoral, femoral, profunda femoral, popliteal and calf veins including the posterior tibial, peroneal and gastrocnemius veins when visible. The superficial great saphenous vein was also interrogated. Spectral Doppler was utilized to evaluate flow at rest and with distal augmentation maneuvers in the common femoral, femoral and popliteal veins. COMPARISON:  None. FINDINGS: RIGHT LOWER EXTREMITY Common Femoral Vein: No evidence of thrombus. Normal compressibility, respiratory phasicity and response to augmentation. Saphenofemoral Junction: No evidence of thrombus. Normal compressibility and flow on color Doppler imaging. Profunda Femoral Vein: No evidence of thrombus. Normal compressibility and flow on color Doppler imaging. Femoral Vein: No evidence of thrombus. Normal compressibility, respiratory phasicity and response to augmentation. Popliteal Vein: No evidence of thrombus. Normal compressibility, respiratory phasicity and response to augmentation. Calf Veins: No evidence of thrombus. Normal compressibility and flow on color Doppler imaging. Other Findings:  None. LEFT LOWER EXTREMITY Common Femoral Vein: No evidence of thrombus. Normal compressibility, respiratory phasicity and response to augmentation. Saphenofemoral Junction: No evidence of thrombus. Normal compressibility and flow on color Doppler imaging.  Profunda Femoral Vein: No evidence of thrombus. Normal compressibility and flow on color Doppler imaging. Femoral Vein: No evidence of thrombus. Normal compressibility, respiratory phasicity and response to augmentation. Popliteal Vein: No evidence of thrombus. Normal compressibility, respiratory phasicity and response to augmentation. Calf Veins: No evidence of thrombus. Normal compressibility and flow on color Doppler imaging. Other Findings:  None. IMPRESSION: No evidence of bilateral deep venous thrombosis. Ruthann Cancer, MD [facility] Electronically Signed   By: Ruthann Cancer MD   On: 06/18/2020 13:54    ____________________________________________   PROCEDURES  Procedure(s) performed (including Critical  Care):  Procedures   ____________________________________________   INITIAL IMPRESSION / ASSESSMENT AND PLAN / ED COURSE  As part of my medical decision making, I reviewed the following data within the electronic MEDICAL RECORD NUMBER Notes from prior ED visits and [place] Controlled Substance Database  60 year old female presents to the ED with complaint of bilateral leg pain that began last night without injury.  Patient states that she used some icy hot to her legs and that the right 1 has improved the left leg continues to feel tight.  Patient was sent by her PCP to the ED for evaluation of DVT.  No previous DVT and patient's history.  Patient is also started a walking program that sounds aggressive with 1 mile for the first week and now she is walking 2 miles a day.  Patient is wearing good supportive shoes when she is doing this.  Bilateral venous ultrasound is negative for DVT.  Patient was given a prescription for meloxicam 7.5 mg 1 daily with food for inflammation and she may continue Tylenol if additional pain medication is needed.  We discussed going back to 1 mile per day and graduating her walking program slowly to prevent muscle  skeletal pain.  Patient will return to the emergency department if any severe worsening of her symptoms.  ____________________________________________   FINAL CLINICAL IMPRESSION(S) / ED DIAGNOSES  Final diagnoses:  Acute pain of left lower extremity  Musculoskeletal pain of extremity     ED Discharge Orders         Ordered    meloxicam (MOBIC) 7.5 MG tablet  Daily        06/18/20 1434          *Please note:  Alyssa Matthews was evaluated in Emergency Department on 06/18/2020 for the symptoms described in the history of present illness. She was evaluated in the context of the global COVID-19 pandemic, which necessitated consideration that the patient might be at risk for infection with the SARS-CoV-2 virus that causes COVID-19. Institutional protocols and algorithms that pertain to the evaluation of patients at risk for COVID-19 are in a state of rapid change based on information released by regulatory bodies including the CDC and federal and state organizations. These policies and algorithms were followed during the patient's care in the ED.  Some ED evaluations and interventions may be delayed as a result of limited staffing during and the pandemic.*   Note:  This document was prepared using Dragon voice recognition software and may include unintentional dictation errors.    Johnn Hai, PA-C 06/18/20 1546    Naaman Plummer, MD 06/18/20 1726

## 2020-06-18 NOTE — Telephone Encounter (Signed)
I called the patient and she stated she has started walking for exercise and her legs are swollen but her left calf is swollen and feels hard to touch and she had a bruise in that spot.  I informed her to go to the Er to have it checked and rule a DVT out and she agreed to do that.  Darin Redmann,cma

## 2020-06-18 NOTE — Telephone Encounter (Signed)
Patient called and would like a call from [place]. Patient stated she had a few questions for Gae Bon and did not want to go into detail.

## 2020-06-18 NOTE — Discharge Instructions (Addendum)
Follow-up with your primary care provider if any continued problems or concerns.  A prescription for meloxicam was sent to your pharmacy.  Make sure you take this medication with food.  It acts as an anti-inflammatory and should help with your muscle pain.  Do not take ibuprofen or Aleve with this medication.  You may take Tylenol if additional pain medication as needed.  You may use heat or ice to your legs as needed for discomfort.  Also gradually increase your walking from 1 mile after several weeks to increase your endurance but do it slowly as this may aggravate your leg pain.  Study shows that you do not have a blood clot.

## 2020-06-18 NOTE — ED Notes (Signed)
EDP updated on pt, [place] orders placed per EDP

## 2020-06-18 NOTE — ED Notes (Signed)
Pt c/o BL LE swelling last night and the right went down but the left is still swollen with some calf tenderness. States she is currently on plavix due to hx of stroke. Denies injury.Marland Kitchen

## 2020-06-29 ENCOUNTER — Ambulatory Visit (INDEPENDENT_AMBULATORY_CARE_PROVIDER_SITE_OTHER): Payer: BC Managed Care – PPO | Admitting: Family Medicine

## 2020-06-29 ENCOUNTER — Other Ambulatory Visit: Payer: Self-pay

## 2020-06-29 DIAGNOSIS — G8929 Other chronic pain: Secondary | ICD-10-CM

## 2020-06-29 DIAGNOSIS — E785 Hyperlipidemia, unspecified: Secondary | ICD-10-CM | POA: Diagnosis not present

## 2020-06-29 DIAGNOSIS — M25561 Pain in right knee: Secondary | ICD-10-CM

## 2020-06-29 DIAGNOSIS — D171 Benign lipomatous neoplasm of skin and subcutaneous tissue of trunk: Secondary | ICD-10-CM

## 2020-06-29 DIAGNOSIS — R7303 Prediabetes: Secondary | ICD-10-CM | POA: Diagnosis not present

## 2020-06-29 DIAGNOSIS — E663 Overweight: Secondary | ICD-10-CM | POA: Diagnosis not present

## 2020-06-29 DIAGNOSIS — I10 Essential (primary) hypertension: Secondary | ICD-10-CM

## 2020-06-29 DIAGNOSIS — M25562 Pain in left knee: Secondary | ICD-10-CM

## 2020-06-29 LAB — POCT GLYCOSYLATED HEMOGLOBIN (HGB A1C): Hemoglobin A1C: 5.7 % — AB (ref 4.0–5.6)

## 2020-06-29 NOTE — Assessment & Plan Note (Signed)
Continue Crestor 40 mg once daily.  Most recent lipid panel adequately controlled.

## 2020-06-29 NOTE — Assessment & Plan Note (Signed)
Stable.  Reports this is unchanged.  I discussed that this is most likely a benign lesion.  Discussed if it changes in any manner she needs to let us know.

## 2020-06-29 NOTE — Assessment & Plan Note (Signed)
Encouraged healthy diet and exercise

## 2020-06-29 NOTE — Assessment & Plan Note (Signed)
Well-controlled today.  Discussed goal of less than 130/80.  She will continue amlodipine 5 mg once daily and losartan 50 mg daily.

## 2020-06-29 NOTE — Progress Notes (Signed)
Tommi Rumps, MD Phone: [phone]  Alyssa Matthews is a 60 y.o. female who presents today for f/u.  HYPERTENSION  Disease Monitoring  Home BP Monitoring yes, though unsure of the numbers Chest pain- no    Dyspnea- no Medications  Compliance-  Taking amlodipine, losartan.   Edema- no  HYPERLIPIDEMIA Symptoms Chest pain on exertion:  no    Medications: Compliance- taking crestor Right upper quadrant pain- no  Muscle aches- no  Prediabetes: No polyuria or polydipsia.  Patient notes she eats a regular diet though has been eating lots of [place] fries.  She has been drinking more sweet tea recently.  She has been walking about a mile a day for exercise.  Left knee pain and swelling: She went to the emergency room for this.  She had a negative ultrasound.  She just started a walking program and increased rapidly from 1 mile per day to 2 miles per day.  She was given Mobic which she took one time as it made her lightheaded.  She notes the discomfort has been improving as she took last week off from walking.  She does have a history of osteoarthritis in her knees and has received a joint injection in the right knee.  Lipoma: This is over her left lateral ribs.  This has been present for years.  It has not changed.  There is no pain.    Social History   Tobacco Use  Smoking Status Former Smoker  . Packs/day: 0.75  . Years: 30.00  . Pack years: 22.50  . Types: Cigarettes  . Quit date: 08/2017  . Years since quitting: 2.8  Smokeless Tobacco Never Used    Current Outpatient Medications on File Prior to Visit  Medication Sig Dispense Refill  . amLODipine (NORVASC) 5 MG tablet Take 1 tablet (5 mg total) by mouth daily. 90 tablet 3  . aspirin EC 81 MG tablet Take 81 mg by mouth daily.    . clopidogrel (PLAVIX) 75 MG tablet Take 1 tablet (75 mg total) by mouth daily. 90 tablet 1  . losartan (COZAAR) 50 MG tablet TAKE 1 TABLET BY MOUTH EVERY DAY 90 tablet 1  . meloxicam (MOBIC) 7.5  MG tablet Take 1 tablet (7.5 mg total) by mouth daily. With food 20 tablet 2  . Multiple Vitamin (MULTIVITAMIN WITH MINERALS) TABS tablet Take 1 tablet by mouth daily.    . potassium chloride (KLOR-CON) 10 MEQ tablet TAKE 1 TABLET BY MOUTH EVERY DAY 90 tablet 3  . rosuvastatin (CRESTOR) 40 MG tablet TAKE 1 TABLET BY MOUTH EVERY DAY 90 tablet 2   No current facility-administered medications on file prior to visit.     ROS see history of present illness  Objective  Physical Exam Vitals:   06/29/20 0932  BP: 114/72  Pulse: 71  Resp: 16  Temp: (!) 96.6 F (35.9 C)  SpO2: 99%    BP Readings from Last 3 Encounters:  06/29/20 114/72  06/18/20 134/74  01/22/20 104/64   Wt Readings from Last 3 Encounters:  06/29/20 186 lb 9.6 oz (84.6 kg)  06/18/20 180 lb (81.6 kg)  02/25/20 182 lb (82.6 kg)    Physical Exam Constitutional:      General: She is not in acute distress.    Appearance: She is not diaphoretic.  Cardiovascular:     Rate and Rhythm: Normal rate and regular rhythm.     Heart sounds: Normal heart sounds.  Pulmonary:     Effort: Pulmonary effort  is normal.     Breath sounds: Normal breath sounds.  Musculoskeletal:     Right lower leg: No edema.     Left lower leg: No edema.     Comments: Bilateral knees with no tenderness or swelling  Skin:    General: Skin is warm and dry.       Neurological:     Mental Status: She is alert.      Assessment/Plan: Please see individual problem list.  Problem List Items Addressed This Visit    Essential hypertension    Well-controlled today.  Discussed goal of less than 130/80.  She will continue amlodipine 5 mg once daily and losartan 50 mg daily.      Lipoma    Stable.  Reports this is unchanged.  I discussed that this is most likely a benign lesion.  Discussed if it changes in any manner she needs to let us know.      Prediabetes    Check A1c.  Encouraged healthy diet and exercise.      Relevant Orders   POCT  HgB A1C (Completed)   Overweight    Encouraged healthy diet and exercise.      Hyperlipidemia    Continue Crestor 40 mg once daily.  Most recent lipid panel adequately controlled.      Bilateral knee pain    Chronic issue.  Likely related osteoarthritis.  She was provided with phone number for her orthopedic office to call if she would like to follow-up with them.  Discussed trying to space out joint injections given risk of progression of degenerative changes.        Health maintenance: Patient defers tetanus vaccine and Shingrix vaccine today.   This visit occurred during the SARS-CoV-2 public health emergency.  Safety protocols were in place, including screening questions prior to the visit, additional usage of staff PPE, and extensive cleaning of exam room while observing appropriate contact time as indicated for disinfecting solutions.    Tommi Rumps, MD [place]

## 2020-06-29 NOTE — Assessment & Plan Note (Signed)
Check A1c.  Encouraged healthy diet and exercise. 

## 2020-06-29 NOTE — Assessment & Plan Note (Signed)
Chronic issue.  Likely related osteoarthritis.  She was provided with phone number for her orthopedic office to call if she would like to follow-up with them.  Discussed trying to space out joint injections given risk of progression of degenerative changes.

## 2020-06-29 NOTE — Patient Instructions (Signed)
Nice to see you. We will let you know what your A1c is. The number for the orthopedic office is [phone].

## 2020-07-23 ENCOUNTER — Other Ambulatory Visit: Payer: Self-pay | Admitting: Family Medicine

## 2020-07-23 DIAGNOSIS — E785 Hyperlipidemia, unspecified: Secondary | ICD-10-CM

## 2020-07-23 DIAGNOSIS — G459 Transient cerebral ischemic attack, unspecified: Secondary | ICD-10-CM

## 2020-08-13 ENCOUNTER — Other Ambulatory Visit: Payer: Self-pay

## 2020-08-13 ENCOUNTER — Encounter: Payer: Self-pay | Admitting: Nurse Practitioner

## 2020-08-13 ENCOUNTER — Ambulatory Visit (INDEPENDENT_AMBULATORY_CARE_PROVIDER_SITE_OTHER): Payer: BC Managed Care – PPO | Admitting: Nurse Practitioner

## 2020-08-13 VITALS — BP 128/78 | HR 67 | Ht 65.0 in | Wt 184.2 lb

## 2020-08-13 DIAGNOSIS — E785 Hyperlipidemia, unspecified: Secondary | ICD-10-CM

## 2020-08-13 DIAGNOSIS — Z8673 Personal history of transient ischemic attack (TIA), and cerebral infarction without residual deficits: Secondary | ICD-10-CM

## 2020-08-13 DIAGNOSIS — I10 Essential (primary) hypertension: Secondary | ICD-10-CM | POA: Diagnosis not present

## 2020-08-13 DIAGNOSIS — Q2112 Patent foramen ovale: Secondary | ICD-10-CM

## 2020-08-13 DIAGNOSIS — Q211 Atrial septal defect: Secondary | ICD-10-CM | POA: Diagnosis not present

## 2020-08-13 NOTE — Patient Instructions (Addendum)
Medication Instructions:   NONE  *If you need a refill on your cardiac medications before your next appointment, please call your pharmacy*   Lab Work:  NONE  If you have labs (blood work) drawn today and your tests are completely normal, you will receive your results only by: Marland Kitchen MyChart Message (if you have MyChart) OR . A paper copy in the mail If you have any lab test that is abnormal or we need to change your treatment, we will call you to review the results.   Testing/Procedures:  NONE   Follow-Up: At [facility], you and your health needs are our priority.  As part of our continuing mission to provide you with exceptional heart care, we have created designated Provider Care Teams.  These Care Teams include your primary Cardiologist (physician) and Advanced Practice Providers (APPs -  Physician Assistants and Nurse Practitioners) who all work together to provide you with the care you need, when you need it.  We recommend signing up for the patient portal called "MyChart".  Sign up information is provided on this After Visit Summary.  MyChart is used to connect with patients for Virtual Visits (Telemedicine).  Patients are able to view lab/test results, encounter notes, upcoming appointments, etc.  Non-urgent messages can be sent to your provider as well.   To learn more about what you can do with MyChart, go to [email or web address].    Your next appointment:   12 months  The format for your next appointment:   In Person  Provider:    F/u with Dr. Rockey Situ   Other Instructions

## 2020-08-13 NOTE — Progress Notes (Signed)
Office Visit    Patient Name: Alyssa Matthews Date of Encounter: 08/13/2020  Primary Care Provider:  Leone Haven, MD Primary Cardiologist:  Ida Rogue, MD  Chief Complaint    60 year old female with a history of TIA/CVA, PFO, hypertension, hyperlipidemia, hyperthyroidism status post radioactive iodine, anemia, and prior tobacco abuse quitting in 2009, presents for follow-up related to hypertension and PFO.  Past Medical History    Past Medical History:  Diagnosis Date  . Abdominal pain, left lower quadrant 06/29/2015  . Benign neoplasm of sigmoid colon   . Bilateral numbness of feet 06/29/2015  . Chronic left shoulder pain 05/17/2017  . Decreased sex drive 07/12/6999  . Dyspnea on exertion 04/18/2016  . Elevated glucose 05/18/2015  . Elevated LDL cholesterol level 06/29/2015  . Hand pain 01/01/2015  . History of blood transfusion   . History of tobacco abuse 05/18/2015  . Hypercholesteremia   . Hypertension   . Hyperthyroidism    a. s/p RAI  . Lacunar infarction ([place])    a. MRI brain 6/19: remote lacunar infarcts involving the pons  . Left ear pain 05/18/2015  . Left hip pain 01/01/2018  . Leg pain 04/16/2015  . Lipoma 04/16/2015  . PFO (patent foramen ovale)    a. TTE 6/19: EF 60-65%, no RWMA, Gr1DD, mild MR, RVSF normal, patent foramen ovale was noted with a positive saline contrast study, PASP normal; b. 09/2018 Echo: EF 55-60%, impaired relaxation. No rwma. Nl RV fxn. PFO. Triv MR; c. 11/2019 Echo: EF 55-60%, no rwma, nl RV fxn. No atrial level shunt detected by doppler.  . Prediabetes 05/18/2015  . Rib contusion, left, initial encounter 06/28/2016  . Sebaceous cyst of labia 12/05/2016  . Shoulder pain, bilateral 01/15/2015  . Special screening for malignant neoplasms, colon   . Statin intolerance 05/18/2015  . Subclinical hyperthyroidism 06/28/2016  . TIA (transient ischemic attack) 08/2017  . URI (upper respiratory infection) 05/17/2017  . Vulvar lesion 10/31/2016  . Wears  dentures    partial upper  . Weight loss 10/31/2016   Past Surgical History:  Procedure Laterality Date  . BREAST CYST ASPIRATION  2015  . COLONOSCOPY WITH PROPOFOL N/A 03/30/2015   Procedure: COLONOSCOPY WITH PROPOFOL;  Surgeon: Lucilla Lame, MD;  Location: [place];  Service: Endoscopy;  Laterality: N/A;  . DILATION AND CURETTAGE OF UTERUS    . POLYPECTOMY  03/30/2015   Procedure: POLYPECTOMY;  Surgeon: Lucilla Lame, MD;  Location: [facility];  Service: Endoscopy;;    Allergies  No Known Allergies  History of Present Illness    60 year old female with above past medical history including TIA in June 2019 with patient reported TIA/CVA approximately 4 years prior to that, with MRI in 2020 showing prior lacunar infarcts.  She also has a history of PFO, hypertension, hyperlipidemia, hyperthyroidism status post radioactive iodine, anemia, mild mitral regurgitation, and tobacco abuse-quitting in 2019.  In June 2019, she was admitted with left-sided facial numbness and left lower extremity numbness, that subsequently resolved.  CT of the head was negative.  MRI of the brain showed remote lacunar infarct involving the pons.  Carotid ultrasound did not show any significant stenosis.  Echocardiogram showed an EF of 60-65% without regional wall motion abnormalities, grade 1 diastolic dysfunction, mild MR, and a PFO with positive saline contrast study.  PASP was normal.  She was seen by neurology and placed on aspirin and this was subsequently switched to Plavix as an outpatient.  PFO was  reviewed by structural heart team later in 2019 and they felt that the PFO was small but that given multiple uncontrolled risk factors for stroke, conservative therapy was recommended.  In April 2021, patient was seen in the emergency department with dizziness and palpitations and PVCs.  She was noted to be hypokalemic and chlorthalidone therapy was subsequently discontinued.  It was replaced with  losartan.  At clinic follow-up June 2021, at which time she was doing reasonably well.  Echocardiogram in September 2021 showed normal LV function.  No atrial level shunt was noted by Doppler.  Since her last visit, she has done well.  She works full-time @ a [employer].  She was walking between 1 and 2 miles a day but noted worsening knee pain and was seen in the emergency department in April due to bilateral knee and upper calf pain.  Work-up was unremarkable.  She was advised to cut back on her walking plan.  She was subsequently referred back to Ortho.  She stands on concrete all day at work but says she has good cushioning in her sneakers.  She denies chest pain, dyspnea, palpitations, PND, orthopnea, dizziness, syncope, edema, or early satiety.  Home Medications    Prior to Admission medications   Medication Sig Start Date End Date Taking? Authorizing Provider  amLODipine (NORVASC) 5 MG tablet Take 1 tablet (5 mg total) by mouth daily. 08/22/19   Theora Gianotti, NP  aspirin EC 81 MG tablet Take 81 mg by mouth daily.    [provider]  clopidogrel (PLAVIX) 75 MG tablet Take 1 tablet (75 mg total) by mouth daily. 02/22/19   Leone Haven, MD  losartan (COZAAR) 50 MG tablet TAKE 1 TABLET BY MOUTH EVERY DAY 06/12/20   Leone Haven, MD  meloxicam (MOBIC) 7.5 MG tablet Take 1 tablet (7.5 mg total) by mouth daily. With food 06/18/20 06/18/21  Johnn Hai, PA-C  Multiple Vitamin (MULTIVITAMIN WITH MINERALS) TABS tablet Take 1 tablet by mouth daily.    [provider]  potassium chloride (KLOR-CON) 10 MEQ tablet TAKE 1 TABLET BY MOUTH EVERY DAY 09/03/19   Rise Mu, PA-C  rosuvastatin (CRESTOR) 40 MG tablet TAKE 1 TABLET BY MOUTH EVERY DAY 07/23/20   Leone Haven, MD    Review of Systems    Bilateral knee pain.  She denies chest pain, dyspnea, palpitations, PND, orthopnea, dizziness, syncope, edema, or early satiety.  All other systems reviewed and are  otherwise negative except as noted above.  Physical Exam    VS:  BP 128/78 (BP Location: Left Arm, Patient Position: Sitting, Cuff Size: Normal)   Pulse 67   Ht 5\' 5"  (1.651 m)   Wt 184 lb 4 oz (83.6 kg)   SpO2 98%   BMI 30.66 kg/m  , BMI Body mass index is 30.66 kg/m. GEN: Well nourished, well developed, in no acute distress. HEENT: normal. Neck: Supple, no JVD, carotid bruits, or masses. Cardiac: RRR, no murmurs, rubs, or gallops. No clubbing, cyanosis, edema.  Radials/PT 2+ and equal bilaterally.  Respiratory:  Respirations regular and unlabored, clear to auscultation bilaterally. GI: Soft, nontender, nondistended, BS + x 4. MS: no deformity or atrophy. Skin: warm and dry, no rash. Neuro:  Strength and sensation are intact. Psych: Normal affect.  Accessory Clinical Findings    ECG personally reviewed by me today -regular sinus rhythm, 67 baseline artifact, anterior T wave inversion - no acute changes.  Lab Results  Component Value Date  WBC 5.8 06/27/2019   HGB 11.2 (L) 06/27/2019   HCT 33.5 (L) 06/27/2019   MCV 83.8 06/27/2019   PLT 275 06/27/2019   Lab Results  Component Value Date   CREATININE 0.65 03/03/2020   BUN 18 03/03/2020   NA 139 03/03/2020   K 3.9 03/03/2020   CL 105 03/03/2020   CO2 28 03/03/2020   Lab Results  Component Value Date   ALT 17 07/22/2019   AST 17 07/22/2019   ALKPHOS 79 07/22/2019   BILITOT 0.4 07/22/2019   Lab Results  Component Value Date   CHOL 133 03/03/2020   HDL 67.40 03/03/2020   LDLCALC 51 03/03/2020   LDLDIRECT 52.0 08/23/2019   TRIG 72.0 03/03/2020   CHOLHDL 2 03/03/2020    Lab Results  Component Value Date   HGBA1C 5.7 (A) 06/29/2020    Assessment & Plan    1.  Patent foramen ovale: Noted on echocardiography in 2019 following TIA.  She was previously evaluated by our structural heart team in late 2019.  PFO was felt to be small and given risk factors for stroke and findings of lacunar infarcts on MRI, it was  felt that PFO was unlikely to have contributed and conservative therapy was recommended.  Most recent echo in September 2021 showed normal LV function.  Atrial level shunt was not noted by Doppler imaging.  She has done well over the past year without neurologic symptoms, chest pain, or dyspnea.  She remains on aspirin and Plavix therapy.  2.  History of TIA/lacunar infarcts: Managed by neurology with aspirin and Plavix.  3.  Essential hypertension: Stable on amlodipine and losartan therapy.  4.  Hyperlipidemia: LDL of 51 in December of last year.  LFTs were normal last May.  5.  Disposition: Follow-up in 1 year or sooner if necessary.  Murray Hodgkins, NP 08/13/2020, 9:57 AM

## 2020-09-15 ENCOUNTER — Other Ambulatory Visit: Payer: Self-pay | Admitting: Nurse Practitioner

## 2020-09-19 ENCOUNTER — Other Ambulatory Visit: Payer: Self-pay | Admitting: Physician Assistant

## 2020-09-23 ENCOUNTER — Telehealth: Payer: Self-pay | Admitting: Family Medicine

## 2020-09-23 DIAGNOSIS — G8929 Other chronic pain: Secondary | ICD-10-CM

## 2020-09-23 NOTE — Addendum Note (Signed)
Addended by: Caryl Bis Latasia Silberstein G on: 09/23/2020 01:47 PM   Modules accepted: Orders

## 2020-09-23 NOTE — Telephone Encounter (Signed)
PT called to state that she is needing a new referral to somewhere else for her knee issues.

## 2020-09-23 NOTE — Telephone Encounter (Signed)
I called the patient and she stated the shot for her knees is not working she would like to see Dr. Eliberto Ivory @ [facility] and she would like  a referral placed to see him.  Minie Roadcap,cma

## 2020-09-23 NOTE — Telephone Encounter (Signed)
Referral placed.

## 2020-10-01 NOTE — Telephone Encounter (Signed)
She needs to be seen for this fairly quickly.  She should contact the orthopedic office to see if they are able to get her in for evaluation.  If she has had any fevers or redness to the knee she needs to go be seen right away at a urgent care today.

## 2020-10-01 NOTE — Telephone Encounter (Signed)
PT called to advise that she has a left knee that is swollen and wants to ask Gae Bon a question in regards to it

## 2020-10-01 NOTE — Telephone Encounter (Signed)
I called the patient and informed her that the provider wanted her to call Dr. Samuel Germany office and see if she can be seen soon.  Patient stated she does not have andy fever or redness to her knee.  I informed her if  she notices any changes to go to an urgent care and she understood.  The phone number to Dr. Eliberto Ivory was given to the patient.  Sherry Rogus,cma

## 2020-10-08 ENCOUNTER — Other Ambulatory Visit: Payer: Self-pay

## 2020-10-08 ENCOUNTER — Emergency Department
Admission: EM | Admit: 2020-10-08 | Discharge: 2020-10-08 | Disposition: A | Discharge status: Home / Self Care | Payer: BC Managed Care – PPO | Attending: Emergency Medicine | Admitting: Emergency Medicine

## 2020-10-08 DIAGNOSIS — Z87891 Personal history of nicotine dependence: Secondary | ICD-10-CM | POA: Insufficient documentation

## 2020-10-08 DIAGNOSIS — G8929 Other chronic pain: Secondary | ICD-10-CM | POA: Insufficient documentation

## 2020-10-08 DIAGNOSIS — Z7902 Long term (current) use of antithrombotics/antiplatelets: Secondary | ICD-10-CM | POA: Insufficient documentation

## 2020-10-08 DIAGNOSIS — Z7982 Long term (current) use of aspirin: Secondary | ICD-10-CM | POA: Insufficient documentation

## 2020-10-08 DIAGNOSIS — M25561 Pain in right knee: Secondary | ICD-10-CM | POA: Insufficient documentation

## 2020-10-08 DIAGNOSIS — Z79899 Other long term (current) drug therapy: Secondary | ICD-10-CM | POA: Insufficient documentation

## 2020-10-08 DIAGNOSIS — I10 Essential (primary) hypertension: Secondary | ICD-10-CM | POA: Insufficient documentation

## 2020-10-08 DIAGNOSIS — Z85038 Personal history of other malignant neoplasm of large intestine: Secondary | ICD-10-CM | POA: Insufficient documentation

## 2020-10-08 MED ORDER — TRAMADOL HCL 50 MG PO TABS: 50.0000 mg | ORAL_TABLET | Freq: Three times a day (TID) | ORAL | 0 refills | Status: DC | PRN

## 2020-10-08 NOTE — Discharge Instructions (Addendum)
Take the prescription pain medicine as needed. Rest with the leg elevated when seated. Follow-up with your ortho provider as scheduled.

## 2020-10-08 NOTE — ED Provider Notes (Signed)
Alyssa Matthews Emergency Department Provider Note ____________________________________________  Time seen: 1331  I have reviewed the triage vital signs and the nursing notes.  HISTORY  Chief Complaint  Knee Pain   HPI Alyssa Matthews is a 60 y.o. female with a history of osteoarthritis, chondromalacia to the right knee, presents for acute on chronic right knee pain.  Patient recently followed by [facility], and reports 2 separate knee joint injections.  She denies any benefit with the most recent injection.  She has subsequently been referred to [facility], and will have her initial visit next week.  Denies any recent injury, trauma, or falls.  She does report wearing steel toe boots at work, and has excessive walking and standing during her work activities.  She denies any catch, click, lock, or give way to the right knee.  Past Medical History:  Diagnosis Date   Abdominal pain, left lower quadrant 06/29/2015   Benign neoplasm of sigmoid colon    Bilateral numbness of feet 06/29/2015   Chronic left shoulder pain 05/17/2017   Decreased sex drive [id number]   Dyspnea on exertion 04/18/2016   Elevated glucose 05/18/2015   Elevated LDL cholesterol level 06/29/2015   Hand pain 01/01/2015   History of blood transfusion    History of tobacco abuse 05/18/2015   Hypercholesteremia    Hypertension    Hyperthyroidism    a. s/p RAI   Lacunar infarction ([place])    a. MRI brain 6/19: remote lacunar infarcts involving the pons   Left ear pain 05/18/2015   Left hip pain 01/01/2018   Leg pain 04/16/2015   Lipoma 04/16/2015   PFO (patent foramen ovale)    a. TTE 6/19: EF 60-65%, no RWMA, Gr1DD, mild MR, RVSF normal, patent foramen ovale was noted with a positive saline contrast study, PASP normal; b. 09/2018 Echo: EF 55-60%, impaired relaxation. No rwma. Nl RV fxn. PFO. Triv MR; c. 11/2019 Echo: EF 55-60%, no rwma, nl RV fxn. No atrial level shunt detected by doppler.   Prediabetes 05/18/2015    Rib contusion, left, initial encounter 06/28/2016   Sebaceous cyst of labia 12/05/2016   Shoulder pain, bilateral 01/15/2015   Special screening for malignant neoplasms, colon    Statin intolerance 05/18/2015   Subclinical hyperthyroidism 06/28/2016   TIA (transient ischemic attack) 08/2017   URI (upper respiratory infection) 05/17/2017   Vulvar lesion 10/31/2016   Wears dentures    partial upper   Weight loss 10/31/2016    Patient Active Problem List   Diagnosis Date Noted   Bilateral knee pain 02/25/2020   Left wrist pain 01/22/2020   Myalgia 07/22/2019   Hypokalemia 07/02/2019   Right foot pain 01/08/2019   Hyperlipidemia 09/03/2018   Anemia 09/03/2018   Light headedness 07/27/2018   Overweight 05/04/2018   Left hip pain 01/01/2018   PFO (patent foramen ovale) 09/15/2017   History of TIA (transient ischemic attack) 08/30/2017   Chronic left shoulder pain 05/17/2017   Sebaceous cyst of labia 12/05/2016   Subclinical hyperthyroidism 06/28/2016   Bilateral numbness of feet 06/29/2015   Tobacco abuse 05/18/2015   Prediabetes 05/18/2015   Lipoma 04/16/2015   Benign neoplasm of sigmoid colon    Shoulder pain, bilateral 01/15/2015   Essential hypertension 01/01/2015    Past Surgical History:  Procedure Laterality Date   BREAST CYST ASPIRATION  2015   COLONOSCOPY WITH PROPOFOL N/A 03/30/2015   Procedure: COLONOSCOPY WITH PROPOFOL;  Surgeon: Lucilla Lame, MD;  Location: [place];  Service: Endoscopy;  Laterality: N/A;   DILATION AND CURETTAGE OF UTERUS     POLYPECTOMY  03/30/2015   Procedure: POLYPECTOMY;  Surgeon: Lucilla Lame, MD;  Location: [place];  Service: Endoscopy;;    Prior to Admission medications   Medication Sig Start Date End Date Taking? Authorizing Provider  traMADol (ULTRAM) 50 MG tablet Take 1 tablet (50 mg total) by mouth 3 (three) times daily as needed. 10/08/20  Yes Tirso Laws, Dannielle Karvonen, PA-C  amLODipine (NORVASC) 5 MG tablet TAKE 1 TABLET  BY MOUTH EVERY DAY 09/15/20   Theora Gianotti, NP  aspirin EC 81 MG tablet Take 81 mg by mouth daily.    [provider]  clopidogrel (PLAVIX) 75 MG tablet Take 1 tablet (75 mg total) by mouth daily. 02/22/19   Leone Haven, MD  losartan (COZAAR) 50 MG tablet TAKE 1 TABLET BY MOUTH EVERY DAY 06/12/20   Leone Haven, MD  meloxicam (MOBIC) 7.5 MG tablet Take 1 tablet (7.5 mg total) by mouth daily. With food 06/18/20 06/18/21  Johnn Hai, PA-C  Multiple Vitamin (MULTIVITAMIN WITH MINERALS) TABS tablet Take 1 tablet by mouth daily.    [provider]  potassium chloride (KLOR-CON) 10 MEQ tablet TAKE 1 TABLET BY MOUTH EVERY DAY 09/21/20   Rise Mu, PA-C  rosuvastatin (CRESTOR) 40 MG tablet TAKE 1 TABLET BY MOUTH EVERY DAY 07/23/20   Leone Haven, MD    Allergies Patient has no known allergies.  Family History  Problem Relation Age of Onset   Hypertension Mother    Kidney disease Brother    Cancer Father        In neck   Drug abuse Paternal Grandmother    Hypertension Brother    Breast cancer Neg Hx    Thyroid disease Neg Hx     Social History Social History   Tobacco Use   Smoking status: Former    Packs/day: 0.75    Years: 30.00    Pack years: 22.50    Types: Cigarettes    Quit date: 08/2017    Years since quitting: 3.1   Smokeless tobacco: Never  Vaping Use   Vaping Use: Never used  Substance Use Topics   Alcohol use: Not Currently    Alcohol/week: 0.0 standard drinks   Drug use: No    Review of Systems  Constitutional: Negative for fever. Cardiovascular: Negative for chest pain. Respiratory: Negative for shortness of breath. Gastrointestinal: Negative for abdominal pain, vomiting and diarrhea. Genitourinary: Negative for dysuria. Musculoskeletal: Negative for back pain. Right knee pain Skin: Negative for rash. Neurological: Negative for headaches, focal weakness or  numbness. ____________________________________________  PHYSICAL EXAM:  VITAL SIGNS: ED Triage Vitals [10/08/20 1157]  Enc Vitals Group     BP 124/74     Pulse Rate 71     Resp 18     Temp 98.4 F (36.9 C)     Temp Source Oral     SpO2 99 %     Weight 183 lb (83 kg)     Height '5\' 5"'$  (1.651 m)     Head Circumference      Peak Flow      Pain Score 6     Pain Loc      Pain Edu?      Excl. in [place]?     Constitutional: Alert and oriented. Well appearing and in no distress. Head: Normocephalic and atraumatic. Cardiovascular: Normal rate, regular rhythm. Normal distal  pulses. Respiratory: Normal respiratory effort. No wheezes/rales/rhonchi. Gastrointestinal: Soft and nontender. No distention. Musculoskeletal: Right knee without obvious deformity or dislocation.  Patient with some medial joint pain on palpation.  Normal active range of motion to the right knee.  No popliteal space fullness is noted.  No calf percolated is noted distally.  Nontender with normal range of motion in all other extremities.  Neurologic:  Normal gait without ataxia. Normal speech and language. No gross focal neurologic deficits are appreciated. Skin:  Skin is warm, dry and intact. No rash noted. ____________________________________________   RADIOLOGY Official radiology report(s):  DG Right Knee (12/21)  IMPRESSION: No fracture or dislocation of the right knee. Joint spaces are well preserved. Small knee joint effusion. ____________________________________________  PROCEDURES   Procedures ____________________________________________   INITIAL IMPRESSION / ASSESSMENT AND PLAN / ED COURSE  As part of my medical decision making, I reviewed the following data within the [facility] [place] chart reviewed, Radiograph reviewed as noted, Notes from prior ED visits, and [place] Controlled Substance Database  Female patient with a history of right knee pain, presents for acute knee pain.  Patient  exam is overall benign reassuring, without any signs of acute internal derangement.  Previous x-rays are negative for any acute findings.  Patient with a history of the arthritis and chondromalacia, will be referred to her primary Ortho provider for further evaluation and management.  A small prescription for Ultram provided for benefit.  She will take over-the-counter Tylenol as needed for nondrowsy pain relief.   ZARAY ARRUE was evaluated in Emergency Department on 10/08/2020 for the symptoms described in the history of present illness. She was evaluated in the context of the global COVID-19 pandemic, which necessitated consideration that the patient might be at risk for infection with the SARS-CoV-2 virus that causes COVID-19. Institutional protocols and algorithms that pertain to the evaluation of patients at risk for COVID-19 are in a state of rapid change based on information released by regulatory bodies including the CDC and federal and state organizations. These policies and algorithms were followed during the patient's care in the ED.  I reviewed the patient's prescription history over the last 12 months in the multi-state controlled substances database(s) that includes [place], [place], [place], [place], [place], [place], [place], [place], [place], [place], [place], [place], [place], and [place].  Results were notable for no current RX.  ____________________________________________  FINAL CLINICAL IMPRESSION(S) / ED DIAGNOSES  Final diagnoses:  Chronic pain of right knee      Carmie End, Dannielle Karvonen, PA-C 10/08/20 1440    Vladimir Crofts, MD 10/08/20 [phone]

## 2020-10-08 NOTE — ED Triage Notes (Signed)
Pt to ED for right knee pain chronically every night. States pain worsened this morning, no injury States [facility], received injections.  Here for pain control

## 2020-10-08 NOTE — Telephone Encounter (Signed)
Noted.  Agree with need for evaluation in the ED.

## 2020-10-08 NOTE — Telephone Encounter (Signed)
Patient left leg swollen warm to touch stated pain rated at 8 on 0-10 she cannot bare weight on knee, and patient making moaning noises on phone. Advised patient the severity of her pain she needs immediate evaluation and with not being able to bare weight. Advised patient she could her Dr. Samuel Germany office for evaluation today or go to ER, patient chose ER due to the severity of her pain.

## 2020-10-12 ENCOUNTER — Telehealth: Payer: Self-pay | Admitting: Family Medicine

## 2020-10-12 NOTE — Telephone Encounter (Signed)
Patient would like for you to call her about her knee pain.

## 2020-10-12 NOTE — Telephone Encounter (Signed)
Patient was called and she wanted the provider to know that she went to the ER for her knee pain and she was given Tramadol and it made her sick, she called the hospital and they informed her to take 1/2 tablet of the tramadol and it still made her sick to her stomach.  She has a appointment on this wednesday with Dr. Eliberto Ivory (ortho) and she wanted the provider to know she has signed up for short term disability because she has been out of work since Thursday 10/08/2020 and will be out until 10/19/2020.  I informed her to take her disability paperwork to Dr. Eliberto Ivory because he would be better at filling it out, patient stated she will take the paperwork to him but she wanted the provider to know those dates just in case.  Ambriana Selway,cma

## 2020-10-12 NOTE — Telephone Encounter (Signed)
Noted.  I agree with orthopedics filling out the paperwork.  Though if needed we can have the patient into the office to complete it if orthopedics will not complete the paperwork.

## 2020-10-14 ENCOUNTER — Other Ambulatory Visit: Payer: Self-pay | Admitting: Physician Assistant

## 2020-10-14 DIAGNOSIS — M2391 Unspecified internal derangement of right knee: Secondary | ICD-10-CM

## 2020-10-22 NOTE — Telephone Encounter (Signed)
Please follow-up with the patient to see if orthopedics filled out her disability paperwork. Thanks.

## 2020-10-23 ENCOUNTER — Other Ambulatory Visit: Payer: Self-pay | Admitting: Family Medicine

## 2020-10-23 DIAGNOSIS — Z1231 Encounter for screening mammogram for malignant neoplasm of breast: Secondary | ICD-10-CM

## 2020-10-23 NOTE — Telephone Encounter (Signed)
I called and LVM informing the patient to call back to ask is [facility] filled out her disability form.  Diedre Maclellan,cma

## 2020-10-23 NOTE — Telephone Encounter (Signed)
Pt called an said that Dr. Eliberto Ivory filled out the paperwork

## 2020-10-26 ENCOUNTER — Ambulatory Visit
Admission: RE | Admit: 2020-10-26 | Discharge: 2020-10-26 | Disposition: A | Discharge status: Home / Self Care | Payer: BC Managed Care – PPO | Source: Ambulatory Visit | Attending: Physician Assistant | Admitting: Physician Assistant

## 2020-10-26 ENCOUNTER — Other Ambulatory Visit: Payer: Self-pay

## 2020-10-26 DIAGNOSIS — M2391 Unspecified internal derangement of right knee: Secondary | ICD-10-CM | POA: Diagnosis present

## 2020-11-02 ENCOUNTER — Other Ambulatory Visit: Payer: Self-pay

## 2020-11-02 ENCOUNTER — Ambulatory Visit (INDEPENDENT_AMBULATORY_CARE_PROVIDER_SITE_OTHER): Payer: BC Managed Care – PPO | Admitting: Family Medicine

## 2020-11-02 DIAGNOSIS — S83206A Unspecified tear of unspecified meniscus, current injury, right knee, initial encounter: Secondary | ICD-10-CM | POA: Insufficient documentation

## 2020-11-02 DIAGNOSIS — S83231D Complex tear of medial meniscus, current injury, right knee, subsequent encounter: Secondary | ICD-10-CM | POA: Diagnosis not present

## 2020-11-02 DIAGNOSIS — I10 Essential (primary) hypertension: Secondary | ICD-10-CM

## 2020-11-02 DIAGNOSIS — E785 Hyperlipidemia, unspecified: Secondary | ICD-10-CM | POA: Diagnosis not present

## 2020-11-02 DIAGNOSIS — R7303 Prediabetes: Secondary | ICD-10-CM | POA: Diagnosis not present

## 2020-11-02 MED ORDER — LOSARTAN POTASSIUM 50 MG PO TABS: 100.0000 mg | ORAL_TABLET | Freq: Every day | ORAL | 1 refills | Status: DC

## 2020-11-02 NOTE — Assessment & Plan Note (Signed)
Check A1c.  Continue dietary changes.

## 2020-11-02 NOTE — Assessment & Plan Note (Signed)
Well-controlled.  Given her swelling in her bilateral legs we will have her stop the amlodipine.  We will increase her losartan to 100 mg once daily.  We will check labs in 7 days.  She will let us know if her swelling is not improving.

## 2020-11-02 NOTE — Assessment & Plan Note (Signed)
I encouraged the patient to contact orthopedics today to get their input given that its been a week since she had the MRI completed.  She can trial the meloxicam again with food.  Advised if it irritates her stomach she needs to let us know.  If she has any side effects with it she will let us know.  We will see what her kidney function is doing in 1 week while taking this.

## 2020-11-02 NOTE — Progress Notes (Signed)
Alyssa Rumps, MD Phone: [phone]  Alyssa Matthews is a 60 y.o. female who presents today for f/u.  HYPERTENSION Disease Monitoring Home BP Monitoring not checking Chest pain- no    Dyspnea- no Medications Compliance-  taking losartan, amlodipine.  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  She notes she has not heard anything from orthopedics regarding this result.  She uses Voltaren gel and another topical treatment which provides some benefit.  She previously tried meloxicam though when she took it during the daytime it made her little lightheaded.  When she takes it at night it does not bother her though she has not been taking this consistently.  She does report some chronic left knee issues as well.  Depression: Patient with some mild depression related to the knee discomfort she has been dealing with.  No SI.  No other issues with depression.  Prediabetes: Not able to exercise related to her knee.  She has cut down on the volume of food she is eating.  She feels as though the tramadol altered her appetite and did make her constipated though those things have improved since stopping tramadol.  Social History   Tobacco Use  Smoking Status Former   Packs/day: 0.75   Years: 30.00   Pack years: 22.50   Types: Cigarettes   Quit date: 08/2017   Years since quitting: 3.2  Smokeless Tobacco Never    Current Outpatient Medications on File Prior to Visit  Medication Sig Dispense Refill   aspirin EC 81 MG tablet Take 81 mg by mouth daily.     clopidogrel (PLAVIX) 75 MG tablet Take 1 tablet (75 mg total) by mouth daily. 90  tablet 1   meloxicam (MOBIC) 7.5 MG tablet Take 1 tablet (7.5 mg total) by mouth daily. With food 20 tablet 2   Multiple Vitamin (MULTIVITAMIN WITH MINERALS) TABS tablet Take 1 tablet by mouth daily.     potassium chloride (KLOR-CON) 10 MEQ tablet TAKE 1 TABLET BY MOUTH EVERY DAY 90 tablet 3   rosuvastatin (CRESTOR) 40 MG tablet TAKE 1 TABLET BY MOUTH EVERY DAY 90 tablet 2   No current facility-administered medications on file prior to visit.     ROS see history of present illness  Objective  Physical Exam Vitals:   11/02/20 0928  BP: 120/78  Pulse: 82  Temp: 98.7 F (37.1 C)  SpO2: 98%    BP Readings from Last 3 Encounters:  11/02/20 120/78  10/08/20 124/74  08/13/20 128/78   Wt Readings from Last 3 Encounters:  11/02/20 181 lb 3.2 oz (82.2 kg)  10/08/20 183 lb (83 kg)  08/13/20 184 lb 4 oz (83.6 kg)    Physical Exam Constitutional:      General: She is not in acute distress.    Appearance: She is not diaphoretic.  Cardiovascular:     Rate and Rhythm: Normal rate and regular rhythm.     Heart sounds: Normal heart sounds.  Pulmonary:     Effort: Pulmonary effort is normal.     Breath sounds: Normal breath sounds.  Musculoskeletal:     Right lower leg:  No edema.     Left lower leg: No edema.     Comments: Slight soreness right knee medial joint line, otherwise no swelling, warmth, or erythema of her right knee, no tenderness, swelling, warmth, or erythema of the left knee, no calf tenderness or cords palpated  Skin:    General: Skin is warm and dry.  Neurological:     Mental Status: She is alert.     Assessment/Plan: Please see individual problem list.  Problem List Items Addressed This Visit     Essential hypertension    Well-controlled.  Given her swelling in her bilateral legs we will have her stop the amlodipine.  We will increase her losartan to 100 mg once daily.  We will check labs in 7 days.  She will let us know if her swelling is not improving.       Relevant Medications   losartan (COZAAR) 50 MG tablet   Hyperlipidemia    Continue Crestor 40 mg once daily.      Relevant Medications   losartan (COZAAR) 50 MG tablet   Other Relevant Orders   Comp Met (CMET)   Prediabetes    Check A1c.  Continue dietary changes.      Relevant Orders   HgB A1c   Right knee meniscal tear    I encouraged the patient to contact orthopedics today to get their input given that its been a week since she had the MRI completed.  She can trial the meloxicam again with food.  Advised if it irritates her stomach she needs to let us know.  If she has any side effects with it she will let us know.  We will see what her kidney function is doing in 1 week while taking this.       Health maintenance: Patient declines tetanus vaccine and Shingrix vaccine at this time preferring to focus on her knees first.  Return in about 1 week (around 11/09/2020) for Labs, nurse BP check, 3 months PCP.  This visit occurred during the SARS-CoV-2 public health emergency.  Safety protocols were in place, including screening questions prior to the visit, additional usage of staff PPE, and extensive cleaning of exam room while observing appropriate contact time as indicated for disinfecting solutions.    Alyssa Rumps, MD [place]

## 2020-11-02 NOTE — Assessment & Plan Note (Signed)
Continue Crestor 40 mg once daily. 

## 2020-11-02 NOTE — Patient Instructions (Signed)
Nice to see you. You can try the meloxicam at night to see if that helps with your knee discomfort. You will discontinue the amlodipine.  We will increase your losartan dose to 100 mg once daily. We will have you return in 1 week for labs and a blood pressure check.

## 2020-11-03 ENCOUNTER — Ambulatory Visit: Payer: BC Managed Care – PPO

## 2020-11-10 ENCOUNTER — Other Ambulatory Visit: Payer: Self-pay

## 2020-11-10 ENCOUNTER — Other Ambulatory Visit (INDEPENDENT_AMBULATORY_CARE_PROVIDER_SITE_OTHER): Payer: BC Managed Care – PPO

## 2020-11-10 DIAGNOSIS — E785 Hyperlipidemia, unspecified: Secondary | ICD-10-CM

## 2020-11-10 DIAGNOSIS — R7303 Prediabetes: Secondary | ICD-10-CM | POA: Diagnosis not present

## 2020-11-10 LAB — HEMOGLOBIN A1C: Hgb A1c MFr Bld: 6.4 % (ref 4.6–6.5)

## 2020-11-10 LAB — COMPREHENSIVE METABOLIC PANEL
ALT: 12 U/L (ref 0–35)
AST: 13 U/L (ref 0–37)
Albumin: 4 g/dL (ref 3.5–5.2)
Alkaline Phosphatase: 78 U/L (ref 39–117)
BUN: 16 mg/dL (ref 6–23)
CO2: 27 mEq/L (ref 19–32)
Calcium: 9.2 mg/dL (ref 8.4–10.5)
Chloride: 105 mEq/L (ref 96–112)
Creatinine, Ser: 0.6 mg/dL (ref 0.40–1.20)
GFR: 99.16 mL/min (ref >60.00)
Glucose, Bld: 95 mg/dL (ref 70–99)
Potassium: 3.7 mEq/L (ref 3.5–5.1)
Sodium: 139 mEq/L (ref 135–145)
Total Bilirubin: 0.4 mg/dL (ref 0.2–1.2)
Total Protein: 6.6 g/dL (ref 6.0–8.3)

## 2020-11-14 DIAGNOSIS — Z20822 Contact with and (suspected) exposure to covid-19: Secondary | ICD-10-CM | POA: Diagnosis not present

## 2020-11-17 ENCOUNTER — Telehealth: Payer: Self-pay | Admitting: Family Medicine

## 2020-11-17 DIAGNOSIS — G459 Transient cerebral ischemic attack, unspecified: Secondary | ICD-10-CM

## 2020-11-17 DIAGNOSIS — E785 Hyperlipidemia, unspecified: Secondary | ICD-10-CM

## 2020-11-17 NOTE — Telephone Encounter (Signed)
Called to speak with Alyssa Matthews. Pt was last seen on 10/2020 by Dr. Caryl Bis. Epic states that Plavix was last refilled in 2020. Alyssa Matthews states that she has run out of medication and she is unsure who last filled it. Pt believes it to be our office. Pt states that she does have a cardiologist and sees Dr.Potters but is unsure if he filled the medication as she does not remember the last time she has seen him. Ok to refill Plavix?

## 2020-11-17 NOTE — Telephone Encounter (Signed)
Patient is calling in to request a refill on her clopidogrel (PLAVIX) 75 MG tablet.She is currently out of this medicine and has not taken it in 3 days.[facility] to [facility] [address] in [place].

## 2020-11-18 MED ORDER — CLOPIDOGREL BISULFATE 75 MG PO TABS: 75.0000 mg | ORAL_TABLET | Freq: Every day | ORAL | 3 refills | Status: DC

## 2020-11-18 NOTE — Telephone Encounter (Signed)
Refill sent to pharmacy.   

## 2020-11-20 DIAGNOSIS — S83231D Complex tear of medial meniscus, current injury, right knee, subsequent encounter: Secondary | ICD-10-CM | POA: Diagnosis not present

## 2020-11-20 DIAGNOSIS — M1711 Unilateral primary osteoarthritis, right knee: Secondary | ICD-10-CM | POA: Diagnosis not present

## 2020-11-23 ENCOUNTER — Ambulatory Visit
Admission: RE | Admit: 2020-11-23 | Discharge: 2020-11-23 | Disposition: A | Discharge status: Home / Self Care | Payer: BC Managed Care – PPO | Source: Ambulatory Visit | Attending: Family Medicine | Admitting: Family Medicine

## 2020-11-23 ENCOUNTER — Other Ambulatory Visit: Payer: Self-pay

## 2020-11-23 DIAGNOSIS — Z1231 Encounter for screening mammogram for malignant neoplasm of breast: Secondary | ICD-10-CM

## 2020-12-10 ENCOUNTER — Other Ambulatory Visit: Payer: Self-pay | Admitting: Family Medicine

## 2020-12-10 DIAGNOSIS — I10 Essential (primary) hypertension: Secondary | ICD-10-CM

## 2020-12-24 ENCOUNTER — Other Ambulatory Visit: Payer: Self-pay | Admitting: Surgery

## 2020-12-28 ENCOUNTER — Other Ambulatory Visit: Payer: Self-pay

## 2020-12-28 ENCOUNTER — Encounter
Admission: RE | Admit: 2020-12-28 | Discharge: 2020-12-28 | Disposition: A | Discharge status: Home / Self Care | Payer: BC Managed Care – PPO | Source: Ambulatory Visit | Attending: Surgery | Admitting: Surgery

## 2020-12-28 ENCOUNTER — Telehealth: Payer: Self-pay | Admitting: *Deleted

## 2020-12-28 HISTORY — DX: Unspecified osteoarthritis, unspecified site: M19.90

## 2020-12-28 NOTE — Patient Instructions (Addendum)
Your procedure is scheduled on:01-06-21 Wednesday Report to the Registration Desk on the 1st floor of the [facility].Then proceed to the 2nd floor Surgery Desk in the [facility] To find out your arrival time, please call [phone] between 1PM - 3PM on:01-05-21 Tuesday  REMEMBER: Instructions that are not followed completely may result in serious medical risk, up to and including death; or upon the discretion of your surgeon and anesthesiologist your surgery may need to be rescheduled.  Do not eat food after midnight the night before surgery.  No gum chewing, lozengers or hard candies.  You may however, drink CLEAR liquids up to 2 hours before you are scheduled to arrive for your surgery. Do not drink anything within 2 hours of your scheduled arrival time.  Clear liquids include: - water  - apple juice without pulp - gatorade (not RED, PURPLE, OR BLUE) - black coffee or tea (Do NOT add milk or creamers to the coffee or tea) Do NOT drink anything that is not on this list.  In addition, your doctor has ordered for you to drink the provided  Ensure Pre-Surgery Clear Carbohydrate Drink  Drinking this carbohydrate drink up to two hours before surgery helps to reduce insulin resistance and improve patient outcomes. Please complete drinking 2 hours prior to scheduled arrival time.  TAKE THESE MEDICATIONS THE MORNING OF SURGERY WITH A SIP OF WATER: -amLODipine (NORVASC) 5 MG tablet  Ask Dr Roland Rack when you see him on 12-30-20 (Wednesday) about when you need to stop your Aspirin and Plavix  One week prior to surgery: Stop Anti-inflammatories (NSAIDS) such as Advil, Aleve, Ibuprofen, Motrin, Naproxen, Naprosyn and Aspirin based products such as Excedrin, Goodys Powder, BC Powder.You may however, continue to take Tylenol if needed for pain up until the day of surgery.  Stop ANY OVER THE COUNTER supplements/vitamins 7 days prior to surgery (Multiple Vitamin (MULTIVITAMIN WITH MINERALS) TABS  tablet)-Continue your potassium chloride (KLOR-CON) 10 MEQ tablet up until the day prior to surgery  No Alcohol for 24 hours before or after surgery.  No Smoking including e-cigarettes for 24 hours prior to surgery.  No chewable tobacco products for at least 6 hours prior to surgery.  No nicotine patches on the day of surgery.  Do not use any "recreational" drugs for at least a week prior to your surgery.  Please be advised that the combination of cocaine and anesthesia may have negative outcomes, up to and including death. If you test positive for cocaine, your surgery will be cancelled.  On the morning of surgery brush your teeth with toothpaste and water, you may rinse your mouth with mouthwash if you wish. Do not swallow any toothpaste or mouthwash.  Use CHG Soap as directed on instruction sheet.  Do not wear jewelry, make-up, hairpins, clips or nail polish.  Do not wear lotions, powders, or perfumes.   Do not shave body from the neck down 48 hours prior to surgery just in case you cut yourself which could leave a site for infection.  Also, freshly shaved skin may become irritated if using the CHG soap.  Contact lenses, hearing aids and dentures may not be worn into surgery.  Do not bring valuables to the hospital. [facility] is not responsible for any missing/lost belongings or valuables.   Notify your doctor if there is any change in your medical condition (cold, fever, infection).  Wear comfortable clothing (specific to your surgery type) to the hospital.  After surgery, you can help prevent  lung complications by doing breathing exercises.  Take deep breaths and cough every 1-2 hours. Your doctor may order a device called an Incentive Spirometer to help you take deep breaths. When coughing or sneezing, hold a pillow firmly against your incision with both hands. This is called "splinting." Doing this helps protect your incision. It also decreases belly discomfort.  If you  are being admitted to the hospital overnight, leave your suitcase in the car. After surgery it may be brought to your room.  If you are being discharged the day of surgery, you will not be allowed to drive home. You will need a responsible adult (18 years or older) to drive you home and stay with you that night.   If you are taking public transportation, you will need to have a responsible adult (18 years or older) with you. Please confirm with your physician that it is acceptable to use public transportation.   Please call the [facility] at [phone] if you have any questions about these instructions.  Surgery Visitation Policy:  Patients undergoing a surgery or procedure may have one family member or support person with them as long as that person is not COVID-19 positive or experiencing its symptoms.  That person may remain in the waiting area during the procedure and may rotate out with other people.  Inpatient Visitation:    Visiting hours are 7 a.m. to 8 p.m. Up to two visitors ages 16+ are allowed at one time in a patient room. The visitors may rotate out with other people during the day. Visitors must check out when they leave, or other visitors will not be allowed. One designated support person may remain overnight. The visitor must pass COVID-19 screenings, use hand sanitizer when entering and exiting the patient's room and wear a mask at all times, including in the patient's room. Patients must also wear a mask when staff or their visitor are in the room. Masking is required regardless of vaccination status.

## 2020-12-28 NOTE — Telephone Encounter (Signed)
Request for pre-operative cardiac clearance Received: Today Alyssa Kitchens, NP  P [facility] Preop Callback Request for pre-operative cardiac clearance:     1. What type of surgery is being performed?  KNEE ARTHROSCOPY WITH DEBRIDEMENT AND PARTIAL MEDIAL MENISCECTOMY   2. When is this surgery scheduled?  01/06/2021     3. Are there any medications that need to be held prior to surgery?  ASA + CLOPIDOGREL   4. Practice name and name of physician performing surgery?  Performing surgeon: Dr. Milagros Evener, MD  Requesting clearance: Honor Loh, FNP-C       5. Anesthesia type (none, local, MAC, general)? General   6. What is the office phone and fax number?  Phone: [phone]  Fax: [phone]   ATTENTION: Unable to create telephone message as per your standard workflow. Directed by [facility] providers to send requests for cardiac clearance to this pool for appropriate distribution to provider covering pre-operative clearances.   Honor Loh, MSN, APRN, FNP-C, CEN  [facility]  Peri-operative Services Nurse Practitioner  Phone: [phone]  12/28/20 1:40 PM

## 2020-12-29 ENCOUNTER — Encounter: Payer: Self-pay | Admitting: Surgery

## 2020-12-29 NOTE — Telephone Encounter (Signed)
   Name: Alyssa Matthews  DOB: 08/24/1960  MRN: [id number]   Primary Cardiologist: Ida Rogue, MD  Chart reviewed as part of pre-operative protocol coverage. Patient was contacted 12/29/2020 in reference to pre-operative risk assessment for pending surgery as outlined below.  Alyssa Matthews was last seen on 08/13/2020 by Murray Hodgkins NP.  Since that day, Alyssa Matthews has done well without exertional chest pain or worsening dyspnea.  Prior to the knee issue, she was walking 1 to 2 miles per day.  Therefore, based on ACC/AHA guidelines, the patient would be at acceptable risk for the planned procedure without further cardiovascular testing.   Note, she is not on aspirin and Plavix due to cardiac reasons, she was placed on aspirin and Plavix by neurology service given history of TIA.  Plavix is currently being prescribed by her PCP.  Will defer to the managing doctor to decide how long to hold aspirin and Plavix.  The patient was advised that if she develops new symptoms prior to surgery to contact our office to arrange for a follow-up visit, and she verbalized understanding.  I will route this recommendation to the requesting party via Epic fax function and remove from pre-op pool. Please call with questions.  [place], [place] 12/29/2020, 6:38 PM

## 2020-12-30 ENCOUNTER — Telehealth: Payer: Self-pay | Admitting: Family Medicine

## 2020-12-30 ENCOUNTER — Other Ambulatory Visit: Payer: Self-pay

## 2020-12-30 ENCOUNTER — Encounter
Admission: RE | Admit: 2020-12-30 | Discharge: 2020-12-30 | Disposition: A | Discharge status: Home / Self Care | Payer: BC Managed Care – PPO | Source: Ambulatory Visit | Attending: Surgery | Admitting: Surgery

## 2020-12-30 ENCOUNTER — Encounter: Payer: Self-pay | Admitting: Surgery

## 2020-12-30 DIAGNOSIS — Z01812 Encounter for preprocedural laboratory examination: Secondary | ICD-10-CM | POA: Diagnosis not present

## 2020-12-30 LAB — CBC
HCT: 34.7 % — ABNORMAL LOW (ref 36.0–46.0)
Hemoglobin: 11.5 g/dL — ABNORMAL LOW (ref 12.0–15.0)
MCH: 29 pg (ref 26.0–34.0)
MCHC: 33.1 g/dL (ref 30.0–36.0)
MCV: 87.4 fL (ref 80.0–100.0)
Platelets: 278 10*3/uL (ref 150–400)
RBC: 3.97 MIL/uL (ref 3.87–5.11)
RDW: 14.6 % (ref 11.5–15.5)
WBC: 5.8 10*3/uL (ref 4.0–10.5)
nRBC: 0 % (ref 0.0–0.2)

## 2020-12-30 NOTE — Telephone Encounter (Signed)
She needs to stop the aspirin and plavix today. She should not take any more from today until after her surgery. She could resume them the day after surgery if her surgeon thinks it is ok from a bleeding perspective.

## 2020-12-30 NOTE — Progress Notes (Signed)
Perioperative Services  Pre-Admission/Anesthesia Testing Clinical Review  Date: 01/01/21  Patient Demographics:  Name: Alyssa Matthews DOB:   November 13, 1960 MRN:   [id number]  Planned Surgical Procedure(s):    Case: [id number] Date/Time: 01/06/21 0715   Procedure: KNEE ARTHROSCOPY WITH DEBRIDEMENT AND PARTIAL MEDIAL MENISCECTOMY (Right: Knee)   Anesthesia type: Choice   Pre-op diagnosis:      Primary osteoarthritis of right knee M17.11     Complex tear of medial meniscus of right knee as current injury, subsequent encounter S83.231D   Location: [facility]   Surgeons: Corky Mull, MD     NOTE: Available PAT nursing documentation and vital signs have been reviewed. Clinical nursing staff has updated patient's PMH/PSHx, current medication list, and drug allergies/intolerances to ensure comprehensive history available to assist in medical decision making as it pertains to the aforementioned surgical procedure and anticipated anesthetic course. Extensive review of available clinical information performed. [name] PMH and PSHx updated with any diagnoses/procedures that  may have been inadvertently omitted during her intake with the pre-admission testing department's nursing staff.  Clinical Discussion:  Alyssa Matthews is a 60 y.o. female who is submitted for pre-surgical anesthesia review and clearance prior to her undergoing the above procedure. Patient is a Former Smoker (22.5 pack years; quit 08/2017). Pertinent PMH includes: PFO, CVA, TIA (multiple), HTN, HLD, prediabetes, subclinical hyperthyroidism, DOE, anemia, OA.  Patient is followed by cardiology Rockey Situ, MD). She was last seen in the cardiology clinic on 08/13/2020; notes reviewed. At the time of her clinic visit, the patient denied any chest pain, shortness of breath, PND, orthopnea, palpitations, significant peripheral edema, vertiginous symptoms, or presyncope/syncope.  Patient with a PMH  significant for cardiovascular diagnoses.  Patient with history of remote lacunar infarcts involving the pons that were first noted on MRI imaging of the brain performed on 08/31/2017. Additionally, patient has had multiple TIAs in the past.   Echocardiogram with bubble study performed on 08/31/2017 revealed a normal left ventricular systolic function with an EF of 60-65%.  There were no regional wall motion abnormalities.  Doppler parameters consistent with abnormal left ventricular relaxation (G1DD).  There was mild mitral valve regurgitation.  Additionally, saline contrast study positive for PFO.  TTE performed on 09/25/2018 revealed a normal left ventricular systolic function with EF 55-60%.  There were no regional wall motion abnormalities.  Left atrium mildly dilated.  Intracardiac shunting noted consistent with previously discovered PFO.  Most recent TTE was performed on 11/13/2019 revealing normal left ventricular systolic function with an EF of 55 to 60%.  There was no evidence of valvular regurgitation or stenosis.  Interestingly enough, there was no intra-atrial shunting noted on color flow Doppler.  Given patient's multiple neurological events, patient is on daily DAPT therapy (ASA + clopidogrel); compliant with therapy with no evidence of GI bleeding.  Blood pressure well controlled at 128/78 on currently prescribed CCB and ARB therapies.  Patient is on a statin for her HLD.  Additionally, patient has a prediabetes diagnosis; last Hgb A1c was 6.4% when checked on 11/10/2020. Functional capacity, as defined by DASI, is documented as being >/= 4 METS.  No changes were made to her medication regimen.  Patient to follow-up with outpatient cardiology in 1 year or sooner if needed.  Patient is jointly managed by neurology Melrose Nakayama, MD) for her multiple TIAs.Patient was last seen in the neurology clinic on 05/19/2020; notes reviewed.  At the time of her clinic  visit, patient reporting LEFT lower  extremity weakness when compared contralaterally.  Patient feels like this is a residual deficit following her previous CVA.  She complained of paresthesias in the LEFT foot occurring throughout the day.  Previously mentioned DAPT medications managed by this provider.  Discussed decreasing clopidogrel to Monday, Wednesday, Friday, however patient declined.  Also discussed obtaining LEFT lower extremity EMG, however patient declined.  Discussed potential of adding gabapentin for symptom exacerbation in the future.  Overall, patient is stable from a neurological standpoint.  No changes were made to her medication regimen during this visit.  Patient to follow-up with outpatient neurology in 3 months or sooner if needed.  Alyssa Matthews is scheduled for an elective RIGHT KNEE ARTHROSCOPY WITH DEBRIDEMENT AND PARTIAL MEDIAL MENISCECTOMY on 01/06/2021 with Dr. Milagros Evener, MD.  Given patient's past medical history significant for cardiovascular and neurological diagnoses, presurgical clearances were sought from patient's primary cardiologist and neurologist.  Specialty clearances were obtained as follows.  Per cardiology Eulas Post, PA-C for [place], MD), "based ACC/AHA guidelines, the patient's past medical history, and the amount of time since her last clinic visit, this patient would be at an overall ACCEPTABLE risk for the planned procedure without further cardiovascular testing or intervention at this time".   Per neurology Melrose Nakayama, MD), "this patient is optimized for surgery from a neurological perspective.  She has had an overall MODERATE risk for perioperative complications. Recommend proceeding with surgery. Benefits of surgery likely outweigh ED probability adjusted risk of briefly stopping antiplatelet drugs for shortest duration possible".  Per internal medicine Caryl Bis, MD), "she needs to stop the aspirin and plavix today. She should not take any more from today until after her surgery. She could  resume them the day after surgery if her surgeon thinks it is ok from a bleeding perspective. My office will also relay this to the patient".  Patient denies previous perioperative complications with anesthesia in the past. In review of the available records, it is noted that patient underwent a MAC anesthetic course at [facility] (ASA II) in 03/2015 without documented complications.   Vitals with BMI 11/02/2020 10/08/2020 08/13/2020  Height _0  _1  _2   Weight 181 lbs 3 oz 183 lbs 184 lbs 4 oz  BMI 30.15 25.42 70.62  Systolic 376 283 151  Diastolic 78 74 78  Pulse 82 71 67    Providers/Specialists:   NOTE: Primary physician provider listed below. Patient may have been seen by APP or partner within same practice.   PROVIDER ROLE / SPECIALTY LAST OV  Poggi, Marshall Cork, MD ORTHOPEDICS (SURGEON) 11/20/2020  Leone Haven, MD PRIMARY CARE PROVIDER 11/02/2020  Ida Rogue, MD CARDIOLOGY 08/13/2020  Gurney Maxin, MD NEUROLOGY 05/19/2020   Allergies:  Patient has no known allergies.  Current Home Medications:   No current facility-administered medications for this encounter.    amLODipine (NORVASC) 5 MG tablet   aspirin EC 81 MG tablet   clopidogrel (PLAVIX) 75 MG tablet   losartan (COZAAR) 50 MG tablet   Multiple Vitamin (MULTIVITAMIN WITH MINERALS) TABS tablet   potassium chloride (KLOR-CON) 10 MEQ tablet   rosuvastatin (CRESTOR) 40 MG tablet   acetaminophen (TYLENOL) 500 MG tablet   meloxicam (MOBIC) 7.5 MG tablet   History:   Past Medical History:  Diagnosis Date   Abdominal pain, left lower quadrant 06/29/2015   Anemia    Arthritis    right knee   Benign neoplasm of sigmoid colon    Bilateral  numbness of feet 06/29/2015   Chronic left shoulder pain 05/17/2017   Decreased sex drive 02/58/5277   Dyspnea on exertion 04/18/2016   Elevated LDL cholesterol level 06/29/2015   History of blood transfusion    History of tobacco abuse 05/18/2015    Hypercholesteremia    Hypertension    Lacunar infarction ([place])    a. MRI brain 6/19: remote lacunar infarcts involving the pons   Lipoma 04/16/2015   Nodule of left lobe of thyroid gland    a.) NM thyroid uptake scan 01/23/2017: large toxic autonomous nodule; I-123 uptake -- 6hr 8.4%, 24hr 9.5%. b.) Carotid doppler 08/31/2017: 2.2 cm LEFT thyroid nodule. c.) CTA neck 12/22/2017: 27 x 15 x 23 mm LEFT thyroid nodule extending to isthmus. d.) Tx'd with oral RAI (I-131) on 03/24/2017.   PFO (patent foramen ovale)    a. TTE 6/19: EF 60-65%, no RWMA, Gr1DD, mild MR, RVSF normal, patent foramen ovale was noted with a positive saline contrast study, PASP normal; b. 09/2018 Echo: EF 55-60%, impaired relaxation. No rwma. Nl RV fxn. PFO. Triv MR; c. 11/2019 Echo: EF 55-60%, no rwma, nl RV fxn. No atrial level shunt detected by doppler.   Prediabetes 05/18/2015   Rib contusion, left, initial encounter 06/28/2016   Sebaceous cyst of labia 12/05/2016   Shoulder pain, bilateral 01/15/2015   Statin intolerance 05/18/2015   Subclinical hyperthyroidism 06/28/2016   TIA (transient ischemic attack)    x3-last one in 2019   Vulvar lesion 10/31/2016   Wears dentures    partial upper   Weight loss 10/31/2016   Past Surgical History:  Procedure Laterality Date   BREAST CYST ASPIRATION  2015   COLONOSCOPY WITH PROPOFOL N/A 03/30/2015   Procedure: COLONOSCOPY WITH PROPOFOL;  Surgeon: Lucilla Lame, MD;  Location: [facility];  Service: Endoscopy;  Laterality: N/A;   DILATION AND CURETTAGE OF UTERUS     POLYPECTOMY  03/30/2015   Procedure: POLYPECTOMY;  Surgeon: Lucilla Lame, MD;  Location: [facility];  Service: Endoscopy;;   Family History  Problem Relation Age of Onset   Hypertension Mother    Kidney disease Brother    Cancer Father        In neck   Drug abuse Paternal Grandmother    Hypertension Brother    Breast cancer Neg Hx    Thyroid disease Neg Hx    Social History   Tobacco Use    Smoking status: Former    Packs/day: 0.75    Years: 30.00    Pack years: 22.50    Types: Cigarettes    Quit date: 08/2017    Years since quitting: 3.3   Smokeless tobacco: Never  Vaping Use   Vaping Use: Never used  Substance Use Topics   Alcohol use: Yes    Comment: red wine occ   Drug use: No    Pertinent Clinical Results:  LABS: Labs reviewed: Acceptable for surgery.  Hospital Outpatient Visit on 12/30/2020  Component Date Value Ref Range Status   WBC 12/30/2020 5.8  4.0 - 10.5 K/uL Final   RBC 12/30/2020 3.97  3.87 - 5.11 MIL/uL Final   Hemoglobin 12/30/2020 11.5 (A)  12.0 - 15.0 g/dL Final   HCT 12/30/2020 34.7 (A)  36.0 - 46.0 % Final   MCV 12/30/2020 87.4  80.0 - 100.0 fL Final   MCH 12/30/2020 29.0  26.0 - 34.0 pg Final   MCHC 12/30/2020 33.1  30.0 - 36.0 g/dL Final   RDW 12/30/2020 14.6  11.5 - 15.5 % Final   Platelets 12/30/2020 278  150 - 400 K/uL Final   nRBC 12/30/2020 0.0  0.0 - 0.2 % Final   Performed at [facility], [address] [place], [place] [address]    ECG: Date: 08/13/2020 Time ECG obtained: 0912 AM Rate: 67 bpm Rhythm: normal sinus Axis (leads I and aVF): Normal Intervals: PR 146 ms. QRS 94 ms. QTc 403 ms. ST segment and T wave changes: Non-acute anteroseptal T wave abnormalities; TWIs in leads V1-V4.  T wave flattening in leads V5-V6  Comparison: Similar to previous tracing obtained on 08/22/2019  IMAGING / PROCEDURES: MR KNEE RIGHT WO CONTRAST performed on 10/26/2020 Complex tear in the midbody region of the medial meniscus which appears to be a flap type tear with a flipped meniscal fragment between the medial femoral condyle and the medial joint capsule. Intact ligamentous structures and no acute bony findings. Tricompartmental degenerative changes most significant in the medial compartment. Small joint effusion and moderate-sized extensively leaking/ruptured Baker's cyst.  ECHOCARDIOGRAM COMPLETE performed on  11/13/2019 Left ventricular ejection fraction, by estimation, is 55 to 60%. The left ventricle has normal function. The left ventricle has no regional wall motion abnormalities. Left ventricular diastolic parameters were normal. Right ventricular systolic function is normal. The right ventricular size is normal.  The mitral valve is normal in structure. No evidence of mitral valve regurgitation. No evidence of mitral stenosis.  The aortic valve is tricuspid. Aortic valve regurgitation is not visualized. No aortic stenosis is present.  The inferior vena cava is normal in size with greater than 50% respiratory variability, suggesting right atrial pressure of 3 mmHg.   ECHOCARDIOGRAM COMPLETE BUBBLE STUDY performed on 08/31/2017 The left ventricular systolic function was normal with an EF of 60-65% No regional wall motion abnormalities Doppler parameters consistent with left ventricular relaxation (grade 1 diastolic dysfunction). There was mild mitral valve regurgitation Right ventricular systolic function normal There was a patent foramen ovale.  Positive saline contrast study. PASP within normal range  Impression and Plan:  Alyssa Matthews has been referred for pre-anesthesia review and clearance prior to her undergoing the planned anesthetic and procedural courses. Available labs, pertinent testing, and imaging results were personally reviewed by me. This patient has been appropriately cleared by cardiology (ACCEPTABLE) and neurology (MODERATE) with the individually indicated risk of significant perioperative complications. Internal medicine is prescribing her DAPT medications at this time. They have provided clearance for the patient to hold these medications prior to surgery (see HPI).   Based on clinical review performed today (01/01/21), barring any significant acute changes in the patient's overall condition, it is anticipated that she will be able to proceed with the planned surgical  intervention. Any acute changes in clinical condition may necessitate her procedure being postponed and/or cancelled. Patient will meet with anesthesia team (MD and/or CRNA) on the day of her procedure for preoperative evaluation/assessment. Questions regarding anesthetic course will be fielded at that time.   Pre-surgical instructions were reviewed with the patient during her PAT appointment and questions were fielded by PAT clinical staff. Patient was advised that if any questions or concerns arise prior to her procedure then she should return a call to PAT and/or her surgeon's office to discuss.  Honor Loh, MSN, APRN, FNP-C, CEN [facility]  Peri-operative Services Nurse Practitioner Phone: [phone] Fax: [phone] 01/01/21 11:18 AM  NOTE: This note has been prepared using Dragon dictation software. Despite my best ability to proofread,  there is always the potential that unintentional transcriptional errors may still occur from this process.

## 2020-12-30 NOTE — Telephone Encounter (Signed)
She needs to stop the aspirin and plavix today. She should not take any more from today until after her surgery. She could resume them the day after surgery if her surgeon thinks it is ok from a bleeding perspective. My office will also relay this to the patient.

## 2020-12-30 NOTE — Telephone Encounter (Signed)
Patient is having surgery next Wednesday on her knee and she is unsure when to stop taking her blood thinners before her surgery.Please call her at [phone].  Yurem Viner,cma

## 2020-12-30 NOTE — Telephone Encounter (Signed)
Patient is having surgery next Wednesday on her knee and she is unsure when to stop taking her blood thinners before her surgery.Please call her at [phone].

## 2020-12-31 NOTE — Telephone Encounter (Signed)
I called and spoke with the patient and asked if she has taking the aspirin or plavix today and she stated no, I informed her to stop the aspirin and plavix and not take anymore until the day after her surgery as long as the surgeon agrees that she can start it back and she understood.  Jaedyn Marrufo,cma

## 2021-01-06 ENCOUNTER — Ambulatory Visit
Admission: RE | Admit: 2021-01-06 | Discharge: 2021-01-06 | Disposition: A | Discharge status: Home / Self Care | Payer: BC Managed Care – PPO | Attending: Surgery | Admitting: Surgery

## 2021-01-06 ENCOUNTER — Other Ambulatory Visit: Payer: Self-pay

## 2021-01-06 ENCOUNTER — Ambulatory Visit: Payer: BC Managed Care – PPO | Admitting: Urgent Care

## 2021-01-06 ENCOUNTER — Encounter
Admission: RE | Disposition: A | Discharge status: Home / Self Care | Payer: Self-pay | Source: Home / Self Care | Attending: Surgery

## 2021-01-06 ENCOUNTER — Encounter: Payer: Self-pay | Admitting: Surgery

## 2021-01-06 ENCOUNTER — Other Ambulatory Visit: Payer: Self-pay | Admitting: Family Medicine

## 2021-01-06 DIAGNOSIS — E785 Hyperlipidemia, unspecified: Secondary | ICD-10-CM

## 2021-01-06 DIAGNOSIS — Z7902 Long term (current) use of antithrombotics/antiplatelets: Secondary | ICD-10-CM | POA: Diagnosis not present

## 2021-01-06 DIAGNOSIS — Z79899 Other long term (current) drug therapy: Secondary | ICD-10-CM | POA: Insufficient documentation

## 2021-01-06 DIAGNOSIS — X58XXXA Exposure to other specified factors, initial encounter: Secondary | ICD-10-CM | POA: Insufficient documentation

## 2021-01-06 DIAGNOSIS — Z8349 Family history of other endocrine, nutritional and metabolic diseases: Secondary | ICD-10-CM | POA: Insufficient documentation

## 2021-01-06 DIAGNOSIS — S83231D Complex tear of medial meniscus, current injury, right knee, subsequent encounter: Secondary | ICD-10-CM | POA: Diagnosis not present

## 2021-01-06 DIAGNOSIS — M1711 Unilateral primary osteoarthritis, right knee: Secondary | ICD-10-CM | POA: Diagnosis not present

## 2021-01-06 DIAGNOSIS — I10 Essential (primary) hypertension: Secondary | ICD-10-CM

## 2021-01-06 DIAGNOSIS — Z87891 Personal history of nicotine dependence: Secondary | ICD-10-CM | POA: Insufficient documentation

## 2021-01-06 DIAGNOSIS — Z7982 Long term (current) use of aspirin: Secondary | ICD-10-CM | POA: Diagnosis not present

## 2021-01-06 DIAGNOSIS — Z841 Family history of disorders of kidney and ureter: Secondary | ICD-10-CM | POA: Diagnosis not present

## 2021-01-06 DIAGNOSIS — S83231A Complex tear of medial meniscus, current injury, right knee, initial encounter: Secondary | ICD-10-CM | POA: Diagnosis not present

## 2021-01-06 DIAGNOSIS — M25561 Pain in right knee: Secondary | ICD-10-CM | POA: Diagnosis not present

## 2021-01-06 DIAGNOSIS — Z8249 Family history of ischemic heart disease and other diseases of the circulatory system: Secondary | ICD-10-CM | POA: Insufficient documentation

## 2021-01-06 DIAGNOSIS — S83206A Unspecified tear of unspecified meniscus, current injury, right knee, initial encounter: Secondary | ICD-10-CM | POA: Diagnosis not present

## 2021-01-06 DIAGNOSIS — Z791 Long term (current) use of non-steroidal anti-inflammatories (NSAID): Secondary | ICD-10-CM | POA: Insufficient documentation

## 2021-01-06 DIAGNOSIS — G459 Transient cerebral ischemic attack, unspecified: Secondary | ICD-10-CM

## 2021-01-06 HISTORY — DX: Nontoxic single thyroid nodule: E04.1

## 2021-01-06 HISTORY — DX: Anemia, unspecified: D64.9

## 2021-01-06 HISTORY — PX: KNEE ARTHROSCOPY: SHX127

## 2021-01-06 LAB — GLUCOSE, CAPILLARY
Glucose-Capillary: 68 mg/dL — ABNORMAL LOW (ref 70–99)
Glucose-Capillary: 115 mg/dL — ABNORMAL HIGH (ref 70–99)

## 2021-01-06 SURGERY — ARTHROSCOPY, KNEE
Anesthesia: General | Site: Knee | Laterality: Right

## 2021-01-06 MED ORDER — DEXAMETHASONE SODIUM PHOSPHATE 10 MG/ML IJ SOLN
INTRAMUSCULAR | Status: AC
  Filled 2021-01-06: qty 1

## 2021-01-06 MED ORDER — LIDOCAINE HCL (PF) 1 % IJ SOLN: INTRAMUSCULAR | Status: DC | PRN

## 2021-01-06 MED ORDER — CHLORHEXIDINE GLUCONATE 0.12 % MT SOLN
OROMUCOSAL | Status: AC
  Administered 2021-01-06: 15 mL via OROMUCOSAL
  Filled 2021-01-06: qty 15

## 2021-01-06 MED ORDER — GLYCOPYRROLATE 0.2 MG/ML IJ SOLN
INTRAMUSCULAR | Status: AC
  Filled 2021-01-06: qty 1

## 2021-01-06 MED ORDER — PROPOFOL 10 MG/ML IV BOLUS
INTRAVENOUS | Status: DC | PRN
  Administered 2021-01-06: 120 mg via INTRAVENOUS
  Administered 2021-01-06: 50 mg via INTRAVENOUS

## 2021-01-06 MED ORDER — LIDOCAINE HCL (PF) 1 % IJ SOLN
INTRAMUSCULAR | Status: AC
  Filled 2021-01-06: qty 30

## 2021-01-06 MED ORDER — MIDAZOLAM HCL 2 MG/2ML IJ SOLN
INTRAMUSCULAR | Status: AC
  Filled 2021-01-06: qty 2

## 2021-01-06 MED ORDER — MIDAZOLAM HCL 2 MG/2ML IJ SOLN
INTRAMUSCULAR | Status: DC | PRN
  Administered 2021-01-06: 2 mg via INTRAVENOUS

## 2021-01-06 MED ORDER — EPHEDRINE SULFATE 50 MG/ML IJ SOLN
INTRAMUSCULAR | Status: DC | PRN
  Administered 2021-01-06: 5 mg via INTRAVENOUS
  Administered 2021-01-06: 10 mg via INTRAVENOUS

## 2021-01-06 MED ORDER — ONDANSETRON HCL 4 MG/2ML IJ SOLN: 4.0000 mg | Freq: Once | INTRAMUSCULAR | Status: DC | PRN

## 2021-01-06 MED ORDER — CHLORHEXIDINE GLUCONATE 0.12 % MT SOLN: 15.0000 mL | Freq: Once | OROMUCOSAL | Status: AC

## 2021-01-06 MED ORDER — PROPOFOL 10 MG/ML IV BOLUS
INTRAVENOUS | Status: AC
  Filled 2021-01-06: qty 20

## 2021-01-06 MED ORDER — BUPIVACAINE-EPINEPHRINE (PF) 0.5% -1:200000 IJ SOLN
INTRAMUSCULAR | Status: DC | PRN
  Administered 2021-01-06: 20 mL

## 2021-01-06 MED ORDER — KETOROLAC TROMETHAMINE 30 MG/ML IJ SOLN
INTRAMUSCULAR | Status: DC | PRN
  Administered 2021-01-06: 15 mg via INTRAVENOUS

## 2021-01-06 MED ORDER — ACETAMINOPHEN 10 MG/ML IV SOLN: 1000.0000 mg | Freq: Once | INTRAVENOUS | Status: DC | PRN

## 2021-01-06 MED ORDER — OXYCODONE HCL 5 MG/5ML PO SOLN: 5.0000 mg | Freq: Once | ORAL | Status: DC | PRN

## 2021-01-06 MED ORDER — ACETAMINOPHEN 10 MG/ML IV SOLN
INTRAVENOUS | Status: AC
  Filled 2021-01-06: qty 100

## 2021-01-06 MED ORDER — BUPIVACAINE-EPINEPHRINE (PF) 0.5% -1:200000 IJ SOLN
INTRAMUSCULAR | Status: AC
  Filled 2021-01-06: qty 60

## 2021-01-06 MED ORDER — OXYCODONE HCL 5 MG PO TABS
5.0000 mg | ORAL_TABLET | Freq: Once | ORAL | Status: DC | PRN
Start: 2021-01-06 — End: 2021-01-06

## 2021-01-06 MED ORDER — GLYCOPYRROLATE 0.2 MG/ML IJ SOLN
INTRAMUSCULAR | Status: DC | PRN
  Administered 2021-01-06: .2 mg via INTRAVENOUS

## 2021-01-06 MED ORDER — LIDOCAINE HCL (CARDIAC) PF 100 MG/5ML IV SOSY
PREFILLED_SYRINGE | INTRAVENOUS | Status: DC | PRN
  Administered 2021-01-06: 100 mg via INTRAVENOUS

## 2021-01-06 MED ORDER — ROSUVASTATIN CALCIUM 40 MG PO TABS: 40.0000 mg | ORAL_TABLET | Freq: Every evening | ORAL | 0 refills | Status: DC

## 2021-01-06 MED ORDER — KETOROLAC TROMETHAMINE 30 MG/ML IJ SOLN
INTRAMUSCULAR | Status: AC
  Filled 2021-01-06: qty 1

## 2021-01-06 MED ORDER — ONDANSETRON HCL 4 MG/2ML IJ SOLN
INTRAMUSCULAR | Status: AC
  Filled 2021-01-06: qty 2

## 2021-01-06 MED ORDER — ORAL CARE MOUTH RINSE: 15.0000 mL | Freq: Once | OROMUCOSAL | Status: AC

## 2021-01-06 MED ORDER — FAMOTIDINE 20 MG PO TABS: 20.0000 mg | ORAL_TABLET | Freq: Once | ORAL | Status: AC

## 2021-01-06 MED ORDER — CEFAZOLIN SODIUM-DEXTROSE 2-4 GM/100ML-% IV SOLN
INTRAVENOUS | Status: AC
  Filled 2021-01-06: qty 100

## 2021-01-06 MED ORDER — FENTANYL CITRATE (PF) 100 MCG/2ML IJ SOLN
INTRAMUSCULAR | Status: DC | PRN
  Administered 2021-01-06 (×2): 50 ug via INTRAVENOUS

## 2021-01-06 MED ORDER — LIDOCAINE HCL (PF) 2 % IJ SOLN
INTRAMUSCULAR | Status: AC
  Filled 2021-01-06: qty 5

## 2021-01-06 MED ORDER — LACTATED RINGERS IV SOLN: INTRAVENOUS | Status: DC

## 2021-01-06 MED ORDER — DEXAMETHASONE SODIUM PHOSPHATE 10 MG/ML IJ SOLN
INTRAMUSCULAR | Status: DC | PRN
  Administered 2021-01-06: 10 mg via INTRAVENOUS

## 2021-01-06 MED ORDER — ONDANSETRON HCL 4 MG/2ML IJ SOLN
INTRAMUSCULAR | Status: DC | PRN
  Administered 2021-01-06: 4 mg via INTRAVENOUS

## 2021-01-06 MED ORDER — FAMOTIDINE 20 MG PO TABS
ORAL_TABLET | ORAL | Status: AC
  Administered 2021-01-06: 20 mg via ORAL
  Filled 2021-01-06: qty 1

## 2021-01-06 MED ORDER — FENTANYL CITRATE (PF) 100 MCG/2ML IJ SOLN
INTRAMUSCULAR | Status: AC
  Filled 2021-01-06: qty 2

## 2021-01-06 MED ORDER — PHENYLEPHRINE HCL (PRESSORS) 10 MG/ML IV SOLN
INTRAVENOUS | Status: DC | PRN
  Administered 2021-01-06: 100 ug via INTRAVENOUS
  Administered 2021-01-06 (×3): 50 ug via INTRAVENOUS

## 2021-01-06 MED ORDER — HYDROCODONE-ACETAMINOPHEN 5-325 MG PO TABS: 1.0000 | ORAL_TABLET | Freq: Four times a day (QID) | ORAL | 0 refills | Status: DC | PRN

## 2021-01-06 MED ORDER — FENTANYL CITRATE (PF) 100 MCG/2ML IJ SOLN: 25.0000 ug | INTRAMUSCULAR | Status: DC | PRN

## 2021-01-06 MED ORDER — LACTATED RINGERS IR SOLN
Status: DC | PRN
  Administered 2021-01-06: 2000 mL

## 2021-01-06 MED ORDER — LOSARTAN POTASSIUM 50 MG PO TABS: 50.0000 mg | ORAL_TABLET | ORAL | 0 refills | Status: DC

## 2021-01-06 MED ORDER — CEFAZOLIN SODIUM-DEXTROSE 2-4 GM/100ML-% IV SOLN
2.0000 g | INTRAVENOUS | Status: AC
  Administered 2021-01-06: 2 g via INTRAVENOUS

## 2021-01-06 SURGICAL SUPPLY — 41 items
APL PRP STRL LF DISP 70% ISPRP (MISCELLANEOUS) ×1
BAG COUNTER SPONGE SURGICOUNT (BAG) IMPLANT
BAG SPNG CNTER NS LX DISP (BAG)
BLADE FULL RADIUS 3.5 (BLADE) ×2 IMPLANT
BLADE SHAVER 4.5X7 STR FR (MISCELLANEOUS) IMPLANT
BNDG ELASTIC 6X5.8 VLCR STR LF (GAUZE/BANDAGES/DRESSINGS) ×2 IMPLANT
BNDG ESMARK 6X12 TAN STRL LF (GAUZE/BANDAGES/DRESSINGS) ×2 IMPLANT
CHLORAPREP W/TINT 26 (MISCELLANEOUS) ×2 IMPLANT
CUFF TOURN SGL QUICK 24 (TOURNIQUET CUFF)
CUFF TOURN SGL QUICK 34 (TOURNIQUET CUFF)
CUFF TRNQT CYL 24X4X16.5-23 (TOURNIQUET CUFF) IMPLANT
CUFF TRNQT CYL 34X4.125X (TOURNIQUET CUFF) IMPLANT
DRAPE IMP U-DRAPE 54X76 (DRAPES) ×2 IMPLANT
ELECT REM PT RETURN 9FT ADLT (ELECTROSURGICAL) ×2
ELECTRODE REM PT RTRN 9FT ADLT (ELECTROSURGICAL) ×1 IMPLANT
FASTFIX NDL DEL SYS 360 CVD (Miscellaneous) ×4 IMPLANT
GAUZE SPONGE 4X4 12PLY STRL (GAUZE/BANDAGES/DRESSINGS) ×2 IMPLANT
GLOVE SURG ENC MOIS LTX SZ8 (GLOVE) ×4 IMPLANT
GLOVE SURG ENC TEXT LTX SZ7 (GLOVE) ×4 IMPLANT
GLOVE SURG UNDER LTX SZ8 (GLOVE) ×2 IMPLANT
GLOVE SURG UNDER POLY LF SZ7.5 (GLOVE) ×2 IMPLANT
GOWN STRL REUS W/ TWL LRG LVL3 (GOWN DISPOSABLE) ×1 IMPLANT
GOWN STRL REUS W/ TWL XL LVL3 (GOWN DISPOSABLE) ×2 IMPLANT
GOWN STRL REUS W/TWL LRG LVL3 (GOWN DISPOSABLE) ×2
GOWN STRL REUS W/TWL XL LVL3 (GOWN DISPOSABLE) ×4
IV LACTATED RINGER IRRG 3000ML (IV SOLUTION) ×2
IV LR IRRIG 3000ML ARTHROMATIC (IV SOLUTION) ×1 IMPLANT
KIT TURNOVER KIT A (KITS) ×2 IMPLANT
MANIFOLD NEPTUNE II (INSTRUMENTS) ×4 IMPLANT
NEEDLE HYPO 21X1.5 SAFETY (NEEDLE) ×2 IMPLANT
PACK ARTHROSCOPY KNEE (MISCELLANEOUS) ×2 IMPLANT
PENCIL ELECTRO HAND CTR (MISCELLANEOUS) ×2 IMPLANT
PUSHER KNOT ARTHRO 360DEG (MISCELLANEOUS) ×2 IMPLANT
SPONGE T-LAP 18X18 ~~LOC~~+RFID (SPONGE) ×2 IMPLANT
SUT PROLENE 4 0 PS 2 18 (SUTURE) ×2 IMPLANT
SUT TICRON COATED BLUE 2 0 30 (SUTURE) IMPLANT
SYR 50ML LL SCALE MARK (SYRINGE) ×2 IMPLANT
SYSTEM NDL DEL FSTFX  360 CVD (Miscellaneous) ×2 IMPLANT
TUBING INFLOW SET DBFLO PUMP (TUBING) ×2 IMPLANT
WAND WEREWOLF FLOW 90D (MISCELLANEOUS) ×2 IMPLANT
WATER STERILE IRR 500ML POUR (IV SOLUTION) ×2 IMPLANT

## 2021-01-06 NOTE — Anesthesia Postprocedure Evaluation (Signed)
Anesthesia Post Note  Patient: Alyssa Matthews  Procedure(s) Performed: KNEE ARTHROSCOPY WITH DEBRIDEMENT AND MENISCAL REPAIR (Right: Knee)  Patient location during evaluation: PACU Anesthesia Type: General Level of consciousness: awake and alert Pain management: pain level controlled Vital Signs Assessment: post-procedure vital signs reviewed and stable Respiratory status: spontaneous breathing, nonlabored ventilation, respiratory function stable and patient connected to nasal cannula oxygen Cardiovascular status: blood pressure returned to baseline and stable Postop Assessment: no apparent nausea or vomiting Anesthetic complications: no   No notable events documented.   Last Vitals:  Vitals:   01/06/21 1045 01/06/21 1053  BP: 122/76   Pulse: 73 72  Resp: 13   Temp:  (!) 36.1 C  SpO2: 98% 98%    Last Pain:  Vitals:   01/06/21 1045  TempSrc:   PainSc: 0-No pain                 Arita Miss

## 2021-01-06 NOTE — H&P (Signed)
History of Present Illness: Alyssa Matthews is a 60 y.o.female who is being referred by Vance Peper, PA, for medial sided right knee pain. Apparently, the patient began to notice the symptoms 9-10 months ago. These symptoms developed without any specific cause or injury. However, the patient notes that she works at [employer] and is on her feet all day long. She presented to [facility] where she received a steroid injection which she notes provided temporary partial relief of her symptoms. She went back several months ago and received a second injection which provided little if any relief of her symptoms. Therefore, the patient elected to follow-up in our office. She saw Vance Peper, PA, last month who ordered an MRI scan of her right knee and referred her to me for further evaluation and treatment. She reports 3/10 pain. The pain is located along the medial aspect of the knee. The pain is described as aching, dull and throbbing. The symptoms are aggravated using stairs, at higher levels of activity, walking, standing and standing pivot. She also describes intermittent catching in her knee. She has no associated swelling and no deformity. She has tried acetaminophen, anti-inflammatories and steroid injections with temporary partial relief.  Current Outpatient Medications:  amLODIPine (NORVASC) 10 MG tablet TAKE 1 TABLET (10 MG TOTAL) BY MOUTH DAILY.   aspirin 81 MG EC tablet Take every other day 90 tablet 1   atorvastatin (LIPITOR) 20 MG tablet Take by mouth   chlorthalidone 25 MG tablet Take by mouth   clopidogreL (PLAVIX) 75 mg tablet TAKE 1 TABLET BY MOUTH EVERY DAY 90 tablet 1   clopidogreL (PLAVIX) 75 mg tablet Take 1 tablet (75 mg total) by mouth once daily 30 tablet 11   losartan (COZAAR) 50 MG tablet Take by mouth   meloxicam (MOBIC) 7.5 MG tablet meloxicam 7.5 mg tablet TAKE 1 TABLET (7.5 MG TOTAL) BY MOUTH DAILY. WITH FOOD   multivitamin tablet Take 1 tablet by mouth once daily   multivitamin  with minerals tablet Take by mouth   potassium chloride (KLOR-CON) 10 MEQ ER tablet Take by mouth   rosuvastatin (CRESTOR) 40 MG tablet Take 40 mg by mouth once daily   traMADoL (ULTRAM) 50 mg tablet Take 50 mg by mouth 3 (three) times daily as needed (Patient not taking: Reported on 11/20/2020)   Allergies: No Known Allergies  Past Medical History:   Primary osteoarthritis of right knee 11/20/2020   Stroke (CMS-HCC) (TIA)   Thyroid disease   Past Surgical History:  No pertinent surgical history.   Family History:   Cancer Father   Kidney failure Brother   Social History:   Socioeconomic History:   Marital status: Married  Tobacco Use   Smoking status: Former Smoker   Smokeless tobacco: Never Used  Scientific laboratory technician Use: Never used  Substance and Sexual Activity   Alcohol use: Not Currently   Drug use: Never   Review of Systems:  A comprehensive 14 point ROS was performed, reviewed, and the pertinent orthopaedic findings are documented in the HPI.  Physical Exam: Vitals:  11/20/20 1005  BP: 124/86  Weight: 81.7 kg (180 lb 3.2 oz)  Height: 165.1 cm (5\' 5" )  PainSc: 3  PainLoc: Knee   General/Constitutional: The patient appears to be well-nourished, well-developed, and in no acute distress. Neuro/Psych: Normal mood and affect, oriented to person, place and time. Eyes: Non-icteric. Pupils are equal, round, and reactive to light, and exhibit synchronous movement. Lymphatic: No palpable adenopathy. Respiratory: Lungs  clear to auscultation, Normal chest excursion, No wheezes and Non-labored breathing Cardiovascular: Regular rate and rhythm. No murmurs. and No edema, swelling or tenderness, except as noted in detailed exam. Vascular: No edema, swelling or tenderness, except as noted in detailed exam. Integumentary: No impressive skin lesions present, except as noted in detailed exam. Musculoskeletal: Unremarkable, except as noted in detailed exam.  Right knee  exam: GAIT: normal and uses no assistive devices. ALIGNMENT: normal SKIN: unremarkable SWELLING: minimal EFFUSION: trace WARMTH: no warmth TENDERNESS: mild over the medial joint line ROM: 0 to 130 degrees with pain in maximal flexion McMURRAY'S: positive PATELLOFEMORAL: normal tracking with no peri-patellar tenderness and negative apprehension sign CREPITUS: Mild patellofemoral crepitance LACHMAN'S: negative PIVOT SHIFT: negative ANTERIOR DRAWER: negative POSTERIOR DRAWER: negative VARUS/VALGUS: stable  She is neurovascularly intact to the right lower extremity and foot.  Knee Imaging: Recent AP weightbearing of both knees, as well as lateral and merchant views of the right knee are available for review. These films demonstrate mild degenerative changes, primarily involving the medial compartment with 20% medial joint space narrowing. Overall alignment is neutral. No fractures, lytic lesions, or abnormal calcifications are noted.  Knee Imaging, external: Right knee: A recent MRI scan of the right knee is available for review and has been reviewed by myself. This study demonstrates evidence of a complex tear involving the medial meniscus with an unstable flap component which appears to be displaced into the medial gutter. Moderate degenerative changes, primarily involving the medial compartment also are noted. No lytic lesions or fractures are identified. Both the films report were reviewed by myself and discussed with the patient.  Assessment:  1. Primary osteoarthritis of right knee.  2. Complex tear of medial meniscus of right knee.   Plan: The treatment options were discussed with the patient. In addition, patient educational materials were provided regarding the diagnosis and treatment options. The patient is frustrated by her persistent symptoms and functional limitations, and is ready to consider more aggressive treatment options. Therefore, I have recommended a surgical  procedure, specifically a right knee arthroscopy with debridement and partial medial meniscectomy. The procedure was discussed with the patient, as were the potential risks (including bleeding, infection, nerve and/or blood vessel injury, persistent or recurrent pain, failure of the repair, progression of arthritis, need for further surgery, blood clots, strokes, heart attacks and/or arhythmias, pneumonia, etc.) and benefits. The patient states her understanding and wishes to proceed. All of the patient's questions and concerns were answered. She can call any time with further concerns. She will return to work without restrictions. She will follow up post-surgery, routine.   H&P reviewed and patient re-examined. No changes.

## 2021-01-06 NOTE — Transfer of Care (Signed)
Immediate Anesthesia Transfer of Care Note  Patient: Alyssa Matthews  Procedure(s) Performed: KNEE ARTHROSCOPY WITH DEBRIDEMENT AND MENISCAL REPAIR (Right: Knee)  Patient Location: PACU  Anesthesia Type:General  Level of Consciousness: sedated  Airway & Oxygen Therapy: Patient Spontanous Breathing and Patient connected to face mask oxygen  Post-op Assessment: Report given to RN and Post -op Vital signs reviewed and stable  Post vital signs: Reviewed and stable  Last Vitals:  Vitals Value Taken Time  BP    Temp    Pulse 75 01/06/21 1001  Resp 13 01/06/21 1001  SpO2 100 % 01/06/21 1001  Vitals shown include unvalidated device data.  Last Pain:  Vitals:   01/06/21 0833  TempSrc: Oral  PainSc: 0-No pain         Complications: No notable events documented.

## 2021-01-06 NOTE — Anesthesia Preprocedure Evaluation (Addendum)
Anesthesia Evaluation  Patient identified by MRN, date of birth, ID band Patient awake    Reviewed: Allergy & Precautions, NPO status , Patient's Chart, lab work & pertinent test results  History of Anesthesia Complications Negative for: history of anesthetic complications  Airway Mallampati: II  TM Distance: >3 FB Neck ROM: Full    Dental  (+) Partial Lower, Partial Upper   Pulmonary neg pulmonary ROS, neg sleep apnea, neg COPD, Patient abstained from smoking.Not current smoker, former smoker,    Pulmonary exam normal breath sounds clear to auscultation       Cardiovascular Exercise Tolerance: Good METShypertension, Pt. on medications (-) CAD and (-) Past MI (-) dysrhythmias  Rhythm:Regular Rate:Normal - Systolic murmurs + PFO. Patient says that her cardiologist would patch the PFO if she had another TIA/stroke, but this hasn't happened yet in the preceding 4 years.  TTE 2021 1. Left ventricular ejection fraction, by estimation, is 55 to 60%. The  left ventricle has normal function. The left ventricle has no regional  wall motion abnormalities. Left ventricular diastolic parameters were  normal.  2. Right ventricular systolic function is normal. The right ventricular  size is normal.  3. The mitral valve is normal in structure. No evidence of mitral valve  regurgitation. No evidence of mitral stenosis.  4. The aortic valve is tricuspid. Aortic valve regurgitation is not  visualized. No aortic stenosis is present.  5. The inferior vena cava is normal in size with greater than 50%  respiratory variability, suggesting right atrial pressure of 3 mmHg.    Neuro/Psych TIACVA, No Residual Symptoms negative psych ROS   GI/Hepatic neg GERD  ,(+)     (-) substance abuse  ,   Endo/Other  neg diabetesHyperthyroidism Subclinical hyperthyroid, not on meds. Has received thyroid radiation, not on any thyroid meds. Optimized per her  primary doctor  Renal/GU negative Renal ROS     Musculoskeletal   Abdominal   Peds  Hematology   Anesthesia Other Findings Past Medical History: 06/29/2015: Abdominal pain, left lower quadrant No date: Anemia No date: Arthritis     Comment:  right knee No date: Benign neoplasm of sigmoid colon 06/29/2015: Bilateral numbness of feet 05/17/2017: Chronic left shoulder pain 04/16/2015: Decreased sex drive 53/64/6803: Dyspnea on exertion 06/29/2015: Elevated LDL cholesterol level No date: History of blood transfusion 05/18/2015: History of tobacco abuse No date: Hypercholesteremia No date: Hypertension No date: Lacunar infarction [facility])     Comment:  a. MRI brain 6/19: remote lacunar infarcts involving the              pons 04/16/2015: Lipoma No date: Nodule of left lobe of thyroid gland     Comment:  a.) NM thyroid uptake scan 01/23/2017: large toxic               autonomous nodule; I-123 uptake -- 6hr 8.4%, 24hr 9.5%.               b.) Carotid doppler 08/31/2017: 2.2 cm LEFT thyroid               nodule. c.) CTA neck 12/22/2017: 27 x 15 x 23 mm LEFT               thyroid nodule extending to isthmus. d.) Tx'd with oral               RAI (I-131) on 03/24/2017. No date: PFO (patent foramen ovale)     Comment:  a. TTE 6/19: EF 60-65%,  no RWMA, Gr1DD, mild MR, RVSF               normal, patent foramen ovale was noted with a positive               saline contrast study, PASP normal; b. 09/2018 Echo: EF               55-60%, impaired relaxation. No rwma. Nl RV fxn. PFO.               Triv MR; c. 11/2019 Echo: EF 55-60%, no rwma, nl RV fxn.               No atrial level shunt detected by doppler. 05/18/2015: Prediabetes 06/28/2016: Rib contusion, left, initial encounter 12/05/2016: Sebaceous cyst of labia 01/15/2015: Shoulder pain, bilateral 05/18/2015: Statin intolerance 06/28/2016: Subclinical hyperthyroidism No date: TIA (transient ischemic attack)     Comment:  x3-last one  in 2019 10/31/2016: Vulvar lesion No date: Wears dentures     Comment:  partial upper 10/31/2016: Weight loss  Reproductive/Obstetrics                           Anesthesia Physical Anesthesia Plan  ASA: 3  Anesthesia Plan: General   Post-op Pain Management:    Induction: Intravenous  PONV Risk Score and Plan: 3 and Ondansetron, Dexamethasone and Midazolam  Airway Management Planned: LMA  Additional Equipment: None  Intra-op Plan:   Post-operative Plan: Extubation in OR  Informed Consent: I have reviewed the patients History and Physical, chart, labs and discussed the procedure including the risks, benefits and alternatives for the proposed anesthesia with the patient or authorized representative who has indicated his/her understanding and acceptance.     Dental advisory given  Plan Discussed with: CRNA and Surgeon  Anesthesia Plan Comments: (Discussed risks of anesthesia with patient, including PONV, sore throat, lip/dental damage. Rare risks discussed as well, such as cardiorespiratory and neurological sequelae, and allergic reactions. Patient understands.)        Anesthesia Quick Evaluation

## 2021-01-06 NOTE — Discharge Instructions (Addendum)
AMBULATORY SURGERY  DISCHARGE INSTRUCTIONS   The drugs that you were given will stay in your system until tomorrow so for the next 24 hours you should not:  Drive an automobile Make any legal decisions Drink any alcoholic beverage   You may resume regular meals tomorrow.  Today it is better to start with liquids and gradually work up to solid foods.  You may eat anything you prefer, but it is better to start with liquids, then soup and crackers, and gradually work up to solid foods.   Please notify your doctor immediately if you have any unusual bleeding, trouble breathing, redness and pain at the surgery site, drainage, fever, or pain not relieved by medication.    Additional Instructions:        Please contact your physician with any problems or Same Day Surgery at [phone], Monday through Friday 6 am to 4 pm, or [place] [facility] number at [phone].    Orthopedic discharge instructions: Keep dressing dry and intact.  May shower after dressing changed on post-op day #4 (Sunday).  Cover sutures with Band-Aids after drying off. Apply ice frequently to knee. Take pain medication as prescribed or ES Tylenol when needed.  May weight-bear as tolerated - use crutches or walker as needed. Follow-up in 10-14 days or as scheduled.

## 2021-01-06 NOTE — Anesthesia Procedure Notes (Addendum)
Procedure Name: LMA Insertion Date/Time: 01/06/2021 9:04 AM Performed by: Demetrius Charity, CRNA Pre-anesthesia Checklist: Patient identified, Patient being monitored, Timeout performed, Emergency Drugs available and Suction available Patient Re-evaluated:Patient Re-evaluated prior to induction Oxygen Delivery Method: Circle system utilized Preoxygenation: Pre-oxygenation with 100% oxygen Induction Type: IV induction Ventilation: Mask ventilation without difficulty LMA: LMA inserted LMA Size: 4.0 Tube type: Oral Number of attempts: 1 Placement Confirmation: positive ETCO2 and breath sounds checked- equal and bilateral Tube secured with: Tape Dental Injury: Teeth and Oropharynx as per pre-operative assessment

## 2021-01-06 NOTE — Op Note (Signed)
01/06/2021  10:20 AM  Patient:   Alyssa Matthews  Pre-Op Diagnosis:   Complex medial meniscus tear with underlying degenerative joint disease, right knee.  Postoperative diagnosis:   Same  Procedure:   Arthroscopic debridement with medial meniscus repair and abrasion chondroplasty of medial femoral condyle and femoral trochlea, right knee.  Surgeon:   Pascal Lux, MD  Anesthesia:   General LMA  Findings:   As above. There was a combined horizontal and radial partial-thickness tear of the posterior medial portion of the medial meniscus. The lateral meniscus was in satisfactory condition, as were the anterior and posterior cruciate ligaments. There were grade 3 chondromalacial changes involving the medial femoral condyle and medial tibial plateau, grade 2 chondromalacial changes involving the lateral tibial plateau, and grade 1-2 focal chondromalacial changes involving the femoral trochlea.  Complications:   None  EBL:   2 cc.  Total fluids:   800 cc of crystalloid.  Tourniquet time:   None  Closure:   4-0 Prolene interrupted sutures.  Brief clinical note:   The patient is a 60 year old female with a history of progressively worsening medial sided right knee pain. Her symptoms have progressed despite medications, activity modification, etc. Her history and examination are consistent with a medial meniscus tear, confirmed by preoperative MRI scan. The patient presents at this time for arthroscopy, debridement, and repair versus partial medial meniscectomy.  Procedure:   The patient was brought into the operating room and lain in the supine position. After adequate general laryngeal mask anesthesia was obtained, a timeout was performed to verify the appropriate side. The patient's right knee was injected sterilely using a solution of 30 cc of 1% lidocaine and 30 cc of 0.5% Sensorcaine with epinephrine. The right lower extremity was prepped with ChloraPrep solution before being draped  sterilely. Preoperative antibiotics were administered. The expected portal sites were injected with 0.5% Sensorcaine with epinephrine before the camera was placed in the anterolateral portal and instrumentation performed through the anteromedial portal.   The knee was sequentially examined beginning in the suprapatellar pouch, then progressing to the patellofemoral space, the medial gutter and compartment, the notch, and finally the lateral compartment and gutter. The findings were as described above. Abundant reactive synovial tissues anteriorly were debrided using the full-radius resector in order to improve visualization. There was a complex tear of the posterior medial portion of the medial meniscus, incorporating both radial and horizontal components with an unstable flap component. It was elected to attempt to repair at this tear. This repair was performed using several [employer] FasT-Fix 360 devices. The repair was assessed and found to be stable to probing. Areas of unstable articular cartilage on the medial femoral condyle and along the central portion of the femoral trochlea were debrided back to stable margins using the full-radius resector. The ArthroCare device was inserted to obtain hemostasis before the instruments were removed from the joint after suctioning the excess fluid.   The portal sites were closed using 4-0 Prolene interrupted sutures before a sterile bulky dressing was applied to the knee. The patient was then awakened, extubated, and returned to the recovery room in satisfactory condition after tolerating the procedure well.

## 2021-01-06 NOTE — Progress Notes (Signed)
Patient POC glucose check resulted a blood sugar result of 68. Patient reports being asymptomatic and denies any symptoms of decreased glucose level. Patient does not check blood sugar at home, prediabetic with elevated A1C. She does not take any medications for glucose control. This result was reported to Dr. Bertell Maria. No new orders at this time.

## 2021-01-06 NOTE — TOC Initial Note (Signed)
Transition of Care [facility]) - Initial/Assessment Note    Patient Details  Name: Alyssa Matthews MRN: [id number] Date of Birth: 07/02/1960  Transition of Care [facility]) CM/SW Contact:    Ova Freshwater Phone Number: [phone] 01/06/2021, 12:47 PM  Clinical Narrative:                  Request for 5" rolling walker sent to [facility] w/ [employer].  Request acknowledged.       Patient Goals and CMS Choice        Expected Discharge Plan and Services                                                Prior Living Arrangements/Services                       Activities of Daily Living      Permission Sought/Granted                  Emotional Assessment              Admission diagnosis:  Primary osteoarthritis of right knee M17.11 Complex tear of medial meniscus of right knee as current injury, subsequent encounter S83.231D Patient Active Problem List   Diagnosis Date Noted   Right knee meniscal tear 11/02/2020   Bilateral knee pain 02/25/2020   Left wrist pain 01/22/2020   Myalgia 07/22/2019   Hypokalemia 07/02/2019   Right foot pain 01/08/2019   Hyperlipidemia 09/03/2018   Anemia 09/03/2018   Light headedness 07/27/2018   Overweight 05/04/2018   Left hip pain 01/01/2018   PFO (patent foramen ovale) 09/15/2017   History of TIA (transient ischemic attack) 08/30/2017   Chronic left shoulder pain 05/17/2017   Sebaceous cyst of labia 12/05/2016   Subclinical hyperthyroidism 06/28/2016   Bilateral numbness of feet 06/29/2015   Tobacco abuse 05/18/2015   Prediabetes 05/18/2015   Lipoma 04/16/2015   Benign neoplasm of sigmoid colon    Shoulder pain, bilateral 01/15/2015   Essential hypertension 01/01/2015   PCP:  Leone Haven, MD Pharmacy:   [id number] [address], [place] [address] Phone: [phone] Fax: [phone]     Social Determinants of Health (SDOH)  Interventions    Readmission Risk Interventions No flowsheet data found.

## 2021-01-07 ENCOUNTER — Telehealth: Payer: Self-pay | Admitting: Family Medicine

## 2021-01-07 NOTE — Telephone Encounter (Signed)
She should contact the surgeon's office.  They would be the ones to determine if it is appropriate to restart her Plavix given that they are aware of any risk of bleeding with the surgery that they did.

## 2021-01-07 NOTE — Telephone Encounter (Signed)
Patient called in stating that she had surgery yesterday and she would like to know if it is ok to start taking her medicine again.Please advise.

## 2021-01-07 NOTE — Telephone Encounter (Signed)
Pt is going to contact surgeons office.

## 2021-01-07 NOTE — Telephone Encounter (Signed)
Patient called in stating that she had surgery yesterday and she would like to know if it is ok to start taking her medicine again.Please advise. Kharizma Lesnick,cma

## 2021-01-11 ENCOUNTER — Telehealth: Payer: Self-pay

## 2021-01-11 NOTE — Telephone Encounter (Signed)
Transition Care Management Follow-up Telephone Call Date of discharge and from where: 01/06/2021  [facility] How have you been since you were released from the hospital? "Doing a lot better" Any questions or concerns? No  Items Reviewed: Did the pt receive and understand the discharge instructions provided? Yes  Medications obtained and verified? Yes  Other? No  Any new allergies since your discharge? No  Dietary orders reviewed? Yes Do you have support at home? Yes   Home Care and Equipment/Supplies: Were home health services ordered? no If so, what is the name of the agency?  Has the agency set up a time to come to the patient's home?  Were any new equipment or medical supplies ordered?  Yes  What is the name of the medical supply agency? [employer] brought to room prior to discharge. Rolling walker Were you able to get the supplies/equipment? not applicable Do you have any questions related to the use of the equipment or supplies? No  Functional Questionnaire: (I = Independent and D = Dependent) ADLs: I  Bathing/Dressing- I  Meal Prep- I  Eating- I  Maintaining continence- I  Transferring/Ambulation- I  Managing Meds- I  Follow up appointments reviewed:  PCP Hospital f/u appt confirmed? Yes  Scheduled to see PCP on 02/07/2021 Specialist Hospital f/u appt confirmed? Yes  Scheduled to see surgeon on 01/18/2021  Are transportation arrangements needed? no If their condition worsens, is the pt aware to call PCP or go to the Emergency Dept.? Yes Was the patient provided with contact information for the PCP's office or ED? Yes Was to pt encouraged to call back with questions or concerns? Yes  Tomasa Rand, RN, BSN, CEN [facility] [employer] [phone]

## 2021-02-02 ENCOUNTER — Ambulatory Visit (INDEPENDENT_AMBULATORY_CARE_PROVIDER_SITE_OTHER): Payer: BC Managed Care – PPO | Admitting: Family Medicine

## 2021-02-02 ENCOUNTER — Encounter: Payer: Self-pay | Admitting: Family Medicine

## 2021-02-02 ENCOUNTER — Other Ambulatory Visit: Payer: Self-pay

## 2021-02-02 VITALS — BP 130/70 | HR 73 | Temp 98.5°F | Ht 65.0 in | Wt 178.4 lb

## 2021-02-02 DIAGNOSIS — E785 Hyperlipidemia, unspecified: Secondary | ICD-10-CM

## 2021-02-02 DIAGNOSIS — Z8673 Personal history of transient ischemic attack (TIA), and cerebral infarction without residual deficits: Secondary | ICD-10-CM | POA: Diagnosis not present

## 2021-02-02 DIAGNOSIS — R7303 Prediabetes: Secondary | ICD-10-CM | POA: Diagnosis not present

## 2021-02-02 DIAGNOSIS — I10 Essential (primary) hypertension: Secondary | ICD-10-CM | POA: Diagnosis not present

## 2021-02-02 DIAGNOSIS — R200 Anesthesia of skin: Secondary | ICD-10-CM | POA: Diagnosis not present

## 2021-02-02 NOTE — Progress Notes (Signed)
Tommi Rumps, MD Phone: [phone]  Alyssa Matthews is a 60 y.o. female who presents today for f/u.  HYPERTENSION Disease Monitoring Home BP Monitoring not checking Chest pain- no    Dyspnea- no Medications Compliance-  taking losartan, amlodipine.  Edema- no BMET    Component Value Date/Time   NA 139 11/10/2020 0838   NA CANCELED 09/25/2018 0851   NA 142 12/11/2013 1054   K 3.7 11/10/2020 0838   K 3.7 12/11/2013 1054   CL 105 11/10/2020 0838   CL 109 (H) 12/11/2013 1054   CO2 27 11/10/2020 0838   CO2 28 12/11/2013 1054   GLUCOSE 95 11/10/2020 0838   GLUCOSE 96 12/11/2013 1054   BUN 16 11/10/2020 0838   BUN CANCELED 09/25/2018 0851   BUN 9 12/11/2013 1054   CREATININE 0.60 11/10/2020 0838   CREATININE 0.70 12/11/2013 1054   CALCIUM 9.2 11/10/2020 0838   CALCIUM 8.5 12/11/2013 1054   GFRNONAA >60 06/27/2019 1635   GFRNONAA >60 12/11/2013 1054   GFRAA >60 06/27/2019 1635   GFRAA >60 12/11/2013 1054   HYPERLIPIDEMIA Symptoms Chest pain on exertion:  no   Medications: Compliance- taking crestor Right upper quadrant pain- no  Muscle aches- no Lipid Panel     Component Value Date/Time   CHOL 133 03/03/2020 0920   TRIG 72.0 03/03/2020 0920   HDL 67.40 03/03/2020 0920   CHOLHDL 2 03/03/2020 0920   VLDL 14.4 03/03/2020 0920   LDLCALC 51 03/03/2020 0920   LDLDIRECT 52.0 08/23/2019 0856   History of TIA: Patient remains on Plavix.  She notes no weakness.  Notes a numbness just on the tip of her right thumb.  No other numbness elsewhere.  Thumb numbness: This has been going on a week or so.  It is intermittent.  She notes no specific inciting factors.  No pins-and-needles.  No issues with this currently.  Social History   Tobacco Use  Smoking Status Former   Packs/day: 0.75   Years: 30.00   Pack years: 22.50   Types: Cigarettes   Quit date: 08/2017   Years since quitting: 3.4  Smokeless Tobacco Never    Current Outpatient Medications on File Prior to  Visit  Medication Sig Dispense Refill   acetaminophen (TYLENOL) 500 MG tablet Take 1,000 mg by mouth every 6 (six) hours as needed.     amLODipine (NORVASC) 5 MG tablet Take 5 mg by mouth every morning.     aspirin EC 81 MG tablet Take 81 mg by mouth daily.     clopidogrel (PLAVIX) 75 MG tablet Take 1 tablet (75 mg total) by mouth daily. 90 tablet 3   HYDROcodone-acetaminophen (NORCO) 5-325 MG tablet Take 1-2 tablets by mouth every 6 (six) hours as needed for moderate pain or severe pain. MAXIMUM TOTAL ACETAMINOPHEN DOSE IS 4000 MG PER DAY 30 tablet 0   losartan (COZAAR) 50 MG tablet Take 1 tablet (50 mg total) by mouth every morning. 30 tablet 0   Multiple Vitamin (MULTIVITAMIN WITH MINERALS) TABS tablet Take 1 tablet by mouth daily.     potassium chloride (KLOR-CON) 10 MEQ tablet TAKE 1 TABLET BY MOUTH EVERY DAY 90 tablet 3   rosuvastatin (CRESTOR) 40 MG tablet Take 1 tablet (40 mg total) by mouth at bedtime. 30 tablet 0   No current facility-administered medications on file prior to visit.     ROS see history of present illness  Objective  Physical Exam Vitals:   02/02/21 1114  BP: 130/70  Pulse: 73  Temp: 98.5 F (36.9 C)  SpO2: 99%    BP Readings from Last 3 Encounters:  02/02/21 130/70  01/06/21 122/76  11/02/20 120/78   Wt Readings from Last 3 Encounters:  02/02/21 178 lb 6.4 oz (80.9 kg)  01/06/21 176 lb (79.8 kg)  11/02/20 181 lb 3.2 oz (82.2 kg)    Physical Exam Constitutional:      General: She is not in acute distress.    Appearance: She is not diaphoretic.  Cardiovascular:     Rate and Rhythm: Normal rate and regular rhythm.     Heart sounds: Normal heart sounds.  Pulmonary:     Effort: Pulmonary effort is normal.     Breath sounds: Normal breath sounds.  Musculoskeletal:     Comments: Sensation to light touch intact bilateral hands, negative Tinel's right wrist, no palpable abnormalities in the right thumb  Skin:    General: Skin is warm and dry.   Neurological:     Mental Status: She is alert.     Assessment/Plan: Please see individual problem list.  Problem List Items Addressed This Visit     Essential hypertension    Adequate control.  She will continue losartan 50 mg once daily and amlodipine 5 mg once daily.  Lab work is up-to-date.      Relevant Orders   Comp Met (CMET)   History of TIA (transient ischemic attack)    No recurrence of symptoms.  She will continue Plavix 75 mg once daily.      Hyperlipidemia    Continue Crestor 40 mg once daily.  We will plan on labs at her next visit.      Relevant Orders   Lipid panel   Prediabetes - Primary   Relevant Orders   Hemoglobin A1c   Thumb numbness    I suspect this is related to a distal nerve impingement given the focality of the numbness.  It does not involve any of the rest of a dermatome.  She does not have a positive Tinel's to indicate carpal tunnel.  Discussed monitoring and if it becomes more of a persistent or consistent issue she will let us know.  Advised to seek medical attention for any  widespread numbness or weakness.        Health Maintenance: The patient defers the pneumonia vaccine.  She will check with her insurance regarding the Shingrix vaccine.  Return in about 3 months (around 05/05/2021) for Prediabetes, hypertension, hyperlipidemia, labs 3 days ahead of time.  This visit occurred during the SARS-CoV-2 public health emergency.  Safety protocols were in place, including screening questions prior to the visit, additional usage of staff PPE, and extensive cleaning of exam room while observing appropriate contact time as indicated for disinfecting solutions.    Tommi Rumps, MD [place]

## 2021-02-02 NOTE — Assessment & Plan Note (Signed)
No recurrence of symptoms.  She will continue Plavix 75 mg once daily.

## 2021-02-02 NOTE — Assessment & Plan Note (Addendum)
I suspect this is related to a distal nerve impingement given the focality of the numbness.  It does not involve any of the rest of a dermatome.  She does not have a positive Tinel's to indicate carpal tunnel.  Discussed monitoring and if it becomes more of a persistent or consistent issue she will let us know.  Advised to seek medical attention for any  widespread numbness or weakness.

## 2021-02-02 NOTE — Patient Instructions (Signed)
Nice to see you. Please check with your insurance regarding the Shingrix vaccine.  When you are ready to get the pneumonia vaccine please let us know. Please monitor your thumb and if you develop any worsening symptoms please let us know.  If you develop widespread numbness or weakness as we discussed you need to seek medical attention immediately.

## 2021-02-02 NOTE — Assessment & Plan Note (Signed)
Adequate control.  She will continue losartan 50 mg once daily and amlodipine 5 mg once daily.  Lab work is up-to-date.

## 2021-02-02 NOTE — Assessment & Plan Note (Signed)
Continue Crestor 40 mg once daily.  We will plan on labs at her next visit.

## 2021-02-09 DIAGNOSIS — R29898 Other symptoms and signs involving the musculoskeletal system: Secondary | ICD-10-CM | POA: Diagnosis not present

## 2021-02-09 DIAGNOSIS — M25661 Stiffness of right knee, not elsewhere classified: Secondary | ICD-10-CM | POA: Diagnosis not present

## 2021-02-09 DIAGNOSIS — S83231D Complex tear of medial meniscus, current injury, right knee, subsequent encounter: Secondary | ICD-10-CM | POA: Diagnosis not present

## 2021-02-09 DIAGNOSIS — M25561 Pain in right knee: Secondary | ICD-10-CM | POA: Diagnosis not present

## 2021-02-12 DIAGNOSIS — S83231D Complex tear of medial meniscus, current injury, right knee, subsequent encounter: Secondary | ICD-10-CM | POA: Diagnosis not present

## 2021-02-15 DIAGNOSIS — S83231D Complex tear of medial meniscus, current injury, right knee, subsequent encounter: Secondary | ICD-10-CM | POA: Diagnosis not present

## 2021-02-18 DIAGNOSIS — S83231D Complex tear of medial meniscus, current injury, right knee, subsequent encounter: Secondary | ICD-10-CM | POA: Diagnosis not present

## 2021-02-22 DIAGNOSIS — S83231D Complex tear of medial meniscus, current injury, right knee, subsequent encounter: Secondary | ICD-10-CM | POA: Diagnosis not present

## 2021-02-25 DIAGNOSIS — S83231D Complex tear of medial meniscus, current injury, right knee, subsequent encounter: Secondary | ICD-10-CM | POA: Diagnosis not present

## 2021-03-01 DIAGNOSIS — S83231D Complex tear of medial meniscus, current injury, right knee, subsequent encounter: Secondary | ICD-10-CM | POA: Diagnosis not present

## 2021-03-04 DIAGNOSIS — S83231D Complex tear of medial meniscus, current injury, right knee, subsequent encounter: Secondary | ICD-10-CM | POA: Diagnosis not present

## 2021-04-08 ENCOUNTER — Other Ambulatory Visit: Payer: Self-pay | Admitting: Family Medicine

## 2021-04-08 DIAGNOSIS — G459 Transient cerebral ischemic attack, unspecified: Secondary | ICD-10-CM

## 2021-04-08 DIAGNOSIS — E785 Hyperlipidemia, unspecified: Secondary | ICD-10-CM

## 2021-05-10 ENCOUNTER — Other Ambulatory Visit (INDEPENDENT_AMBULATORY_CARE_PROVIDER_SITE_OTHER): Payer: BC Managed Care – PPO

## 2021-05-10 ENCOUNTER — Other Ambulatory Visit: Payer: Self-pay

## 2021-05-10 DIAGNOSIS — I10 Essential (primary) hypertension: Secondary | ICD-10-CM

## 2021-05-10 DIAGNOSIS — E785 Hyperlipidemia, unspecified: Secondary | ICD-10-CM | POA: Diagnosis not present

## 2021-05-10 DIAGNOSIS — R7303 Prediabetes: Secondary | ICD-10-CM

## 2021-05-10 LAB — COMPREHENSIVE METABOLIC PANEL
ALT: 12 U/L (ref 0–35)
AST: 14 U/L (ref 0–37)
Albumin: 4.2 g/dL (ref 3.5–5.2)
Alkaline Phosphatase: 76 U/L (ref 39–117)
BUN: 19 mg/dL (ref 6–23)
CO2: 27 mEq/L (ref 19–32)
Calcium: 9.1 mg/dL (ref 8.4–10.5)
Chloride: 109 mEq/L (ref 96–112)
Creatinine, Ser: 0.71 mg/dL (ref 0.40–1.20)
GFR: 93.61 mL/min (ref >60.00)
Glucose, Bld: 105 mg/dL — ABNORMAL HIGH (ref 70–99)
Potassium: 3.9 mEq/L (ref 3.5–5.1)
Sodium: 143 mEq/L (ref 135–145)
Total Bilirubin: 0.3 mg/dL (ref 0.2–1.2)
Total Protein: 7 g/dL (ref 6.0–8.3)

## 2021-05-10 LAB — HEMOGLOBIN A1C: Hgb A1c MFr Bld: 6.3 % (ref 4.6–6.5)

## 2021-05-10 LAB — LIPID PANEL
Cholesterol: 138 mg/dL (ref 0–200)
HDL: 68.1 mg/dL (ref >39.00)
LDL Cholesterol: 53 mg/dL (ref 0–99)
NonHDL: 70.04
Total CHOL/HDL Ratio: 2
Triglycerides: 85 mg/dL (ref 0.0–149.0)
VLDL: 17 mg/dL (ref 0.0–40.0)

## 2021-05-12 ENCOUNTER — Encounter: Payer: Self-pay | Admitting: Family Medicine

## 2021-05-12 ENCOUNTER — Ambulatory Visit: Payer: BC Managed Care – PPO | Admitting: Family Medicine

## 2021-05-12 ENCOUNTER — Other Ambulatory Visit: Payer: Self-pay

## 2021-05-12 DIAGNOSIS — E785 Hyperlipidemia, unspecified: Secondary | ICD-10-CM | POA: Diagnosis not present

## 2021-05-12 DIAGNOSIS — E669 Obesity, unspecified: Secondary | ICD-10-CM

## 2021-05-12 DIAGNOSIS — I10 Essential (primary) hypertension: Secondary | ICD-10-CM | POA: Diagnosis not present

## 2021-05-12 DIAGNOSIS — R7303 Prediabetes: Secondary | ICD-10-CM | POA: Diagnosis not present

## 2021-05-12 NOTE — Patient Instructions (Signed)
Nice to see you. ?Please consider counting your calories. A goal daily calorie count for 1 pound of weight loss per week would be 1428 calories.  ?You certainly need to reduce your fast food intake and sugary drink intake. ?Please try to walk for 20 to 30 minutes 3 times a week. ?

## 2021-05-12 NOTE — Assessment & Plan Note (Signed)
Encouraged diet and exercise.  

## 2021-05-12 NOTE — Assessment & Plan Note (Addendum)
Well-controlled. She will continue amlodipine 5 mg daily and losartan 50 mg daily  

## 2021-05-12 NOTE — Assessment & Plan Note (Addendum)
I had a lengthy discussion with the patient regarding dietary changes and exercise.  Discussed walking for exercise 2-3 times a week for 20 to 30 minutes at a time.  Discussed if she is unable to do this related to her knees she needs to let us know.  Discussed reducing fast food intake and sugary drink intake.  She is already trying to reduce sweet intake.  Discussed popcorn without butter as a healthy snack though she would need to stick to 1 serving at a time.  Discussed a calorie goal of 1428/day to lose 1 pound per week with 2 to 3 days of exercise.  She was advised not to ever drop below 1200 cal/day.  I discussed that weight loss medication would not be an option until she has completed a trial of diet and exercise.  We will follow-up in 3 months. ?

## 2021-05-12 NOTE — Progress Notes (Signed)
?Tommi Rumps, MD ?Phone: [phone] ? ?Alyssa Matthews is a 61 y.o. female who presents today for f/u. ? ?HYPERTENSION ?Disease Monitoring ?Home BP Monitoring 117/74 most recently at home Chest pain- no    Dyspnea- no ?Medications ?Compliance-  taking amlodipine, losartan.   Edema- no ?BMET ?   ?Component Value Date/Time  ? NA 143 05/10/2021 0805  ? NA CANCELED 09/25/2018 0851  ? NA 142 12/11/2013 1054  ? K 3.9 05/10/2021 0805  ? K 3.7 12/11/2013 1054  ? CL 109 05/10/2021 0805  ? CL 109 (H) 12/11/2013 1054  ? CO2 27 05/10/2021 0805  ? CO2 28 12/11/2013 1054  ? GLUCOSE 105 (H) 05/10/2021 0805  ? GLUCOSE 96 12/11/2013 1054  ? BUN 19 05/10/2021 0805  ? BUN CANCELED 09/25/2018 0851  ? BUN 9 12/11/2013 1054  ? CREATININE 0.71 05/10/2021 0805  ? CREATININE 0.70 12/11/2013 1054  ? CALCIUM 9.1 05/10/2021 0805  ? CALCIUM 8.5 12/11/2013 1054  ? GFRNONAA >60 06/27/2019 1635  ? GFRNONAA >60 12/11/2013 1054  ? GFRAA >60 06/27/2019 1635  ? GFRAA >60 12/11/2013 1054  ? ?HYPERLIPIDEMIA ?Symptoms ?Chest pain on exertion:  no   Leg claudication:   no ?Medications: ?Compliance- taking crestor Right upper quadrant pain- no  Muscle aches- no ?Lipid Panel  ?   ?Component Value Date/Time  ? CHOL 138 05/10/2021 0805  ? TRIG 85.0 05/10/2021 0805  ? HDL 68.10 05/10/2021 0805  ? CHOLHDL 2 05/10/2021 0805  ? VLDL 17.0 05/10/2021 0805  ? Diaz 53 05/10/2021 0805  ? LDLDIRECT 52.0 08/23/2019 0856  ? ?Prediabetes: No polyuria or polydipsia.  She has been eating what she wants.  She has not been exercising.  She notes previously having knee discomfort when trying to walk prior to a knee surgery.  She has not tried to walk for exercise since she had her knee surgery.  She works second shift and eats her first meal on the way to work.  She typically stops and gets fast food.  She will have a mid shift meal with home-cooked food with a variety of meats and vegetables.  She does not eat fruit.  She has sweet tea most days though has reduced  from a large sweet tea to a medium sweet tea.  She does not have soda often.  She does eat sweets frequently though she is trying to cut those out for [place].  She has popcorn with butter nightly and eats the whole bag.  1 glass of red wine nightly. ? ?Social History  ? ?Tobacco Use  ?Smoking Status Former  ? Packs/day: 0.75  ? Years: 30.00  ? Pack years: 22.50  ? Types: Cigarettes  ? Quit date: 08/2017  ? Years since quitting: 3.7  ?Smokeless Tobacco Never  ? ? ?Current Outpatient Medications on File Prior to Visit  ?Medication Sig Dispense Refill  ? acetaminophen (TYLENOL) 500 MG tablet Take 1,000 mg by mouth every 6 (six) hours as needed.    ? amLODipine (NORVASC) 5 MG tablet Take 5 mg by mouth every morning.    ? aspirin EC 81 MG tablet Take 81 mg by mouth daily.    ? clopidogrel (PLAVIX) 75 MG tablet Take 1 tablet (75 mg total) by mouth daily. 90 tablet 3  ? HYDROcodone-acetaminophen (NORCO) 5-325 MG tablet Take 1-2 tablets by mouth every 6 (six) hours as needed for moderate pain or severe pain. MAXIMUM TOTAL ACETAMINOPHEN DOSE IS 4000 MG PER DAY 30 tablet 0  ?  losartan (COZAAR) 50 MG tablet Take 1 tablet (50 mg total) by mouth every morning. 30 tablet 0  ? Multiple Vitamin (MULTIVITAMIN WITH MINERALS) TABS tablet Take 1 tablet by mouth daily.    ? potassium chloride (KLOR-CON) 10 MEQ tablet TAKE 1 TABLET BY MOUTH EVERY DAY 90 tablet 3  ? rosuvastatin (CRESTOR) 40 MG tablet TAKE 1 TABLET BY MOUTH EVERY DAY 90 tablet 2  ? ?No current facility-administered medications on file prior to visit.  ? ? ? ?ROS see history of present illness ? ?Objective ? ?Physical Exam ?Vitals:  ? 05/12/21 0957  ?BP: 120/78  ?Pulse: 78  ?Temp: 98.5 ?F (36.9 ?C)  ?SpO2: 98%  ? ? ?BP Readings from Last 3 Encounters:  ?05/12/21 120/78  ?02/02/21 130/70  ?01/06/21 122/76  ? ?Wt Readings from Last 3 Encounters:  ?05/12/21 181 lb 6.4 oz (82.3 kg)  ?02/02/21 178 lb 6.4 oz (80.9 kg)  ?01/06/21 176 lb (79.8 kg)  ? ? ?Physical  Exam ?Constitutional:   ?   General: She is not in acute distress. ?   Appearance: She is not diaphoretic.  ?Cardiovascular:  ?   Rate and Rhythm: Normal rate and regular rhythm.  ?   Heart sounds: Normal heart sounds.  ?Pulmonary:  ?   Effort: Pulmonary effort is normal.  ?   Breath sounds: Normal breath sounds.  ?Skin: ?   General: Skin is warm and dry.  ?Neurological:  ?   Mental Status: She is alert.  ? ? ? ?Assessment/Plan: Please see individual problem list. ? ?Problem List Items Addressed This Visit   ? ? Essential hypertension (Chronic)  ?  Well controlled.  She will continue amlodipine 5 mg daily and losartan 50 mg daily. ?  ?  ? Hyperlipidemia (Chronic)  ?  Well-controlled.  Continue Crestor 40 mg daily. ?  ?  ? Obesity (BMI 30.0-34.9)  ?  I had a lengthy discussion with the patient regarding dietary changes and exercise.  Discussed walking for exercise 2-3 times a week for 20 to 30 minutes at a time.  Discussed if she is unable to do this related to her knees she needs to let us know.  Discussed reducing fast food intake and sugary drink intake.  She is already trying to reduce sweet intake.  Discussed popcorn without butter as a healthy snack though she would need to stick to 1 serving at a time.  Discussed a calorie goal of 1428/day to lose 1 pound per week with 2 to 3 days of exercise.  She was advised not to ever drop below 1200 cal/day.  I discussed that weight loss medication would not be an option until she has completed a trial of diet and exercise.  We will follow-up in 3 months. ?  ?  ? Prediabetes  ?  Encouraged diet and exercise. ?  ?  ? ? ? ?Health Maintenance: The patient defers the Shingrix vaccine at this time.  I did encourage her to get this at some point in the future. ? ?Return in about 3 months (around 08/12/2021) for weight. ? ?This visit occurred during the SARS-CoV-2 public health emergency.  Safety protocols were in place, including screening questions prior to the visit, additional  usage of staff PPE, and extensive cleaning of exam room while observing appropriate contact time as indicated for disinfecting solutions.  ? ? ?Tommi Rumps, MD ?[place] ? ?

## 2021-05-12 NOTE — Assessment & Plan Note (Signed)
Well-controlled.  Continue Crestor 40 mg daily. 

## 2021-05-24 DIAGNOSIS — R200 Anesthesia of skin: Secondary | ICD-10-CM | POA: Diagnosis not present

## 2021-05-24 DIAGNOSIS — Z8673 Personal history of transient ischemic attack (TIA), and cerebral infarction without residual deficits: Secondary | ICD-10-CM | POA: Diagnosis not present

## 2021-05-24 DIAGNOSIS — R29898 Other symptoms and signs involving the musculoskeletal system: Secondary | ICD-10-CM | POA: Diagnosis not present

## 2021-05-27 ENCOUNTER — Telehealth: Payer: Self-pay | Admitting: Family Medicine

## 2021-05-27 DIAGNOSIS — Z1329 Encounter for screening for other suspected endocrine disorder: Secondary | ICD-10-CM

## 2021-05-27 NOTE — Telephone Encounter (Signed)
Pt called in stating that she was put on a medication(pt didn't say what medication) and she didn't know if she should take the medication with her blood thinner. Pt requesting callback.  ?

## 2021-05-28 NOTE — Addendum Note (Signed)
Addended by: Leone Haven on: 05/28/2021 03:45 PM ? ? Modules accepted: Orders ? ?

## 2021-05-28 NOTE — Telephone Encounter (Signed)
I called and spoke with the patient and she stated that she went to see her Neurologist and he put her on Gabapentin 100 mg and wants her to take 1 pill in the AM and 1 pill in the PM this week and next week she is to take 2 tablets in the AM and 2 tablets in the PM, he cut back on her blood thinner to  take on Monday, wednesday and Friday because she bruises easily and the patient wants to know if it is safe to take the Gabapentin while taking the blood thinner? Also she stated that  the neurologist thinks she should have labs to check her thyroid because it has not been checked since 2021.  Please advise.  Omri Bertran,cma  ?

## 2021-05-28 NOTE — Telephone Encounter (Signed)
The gabapentin should be ok with the plavix. I will place the orders for the TSH check.  ?

## 2021-05-31 NOTE — Telephone Encounter (Signed)
I called and spoke with the patient and informed her that the Gabapentin is okay with the Plavix and she did schedule a lab appointment to check her TSH with me over the phone.  Doron Shake,cma  ?

## 2021-06-04 ENCOUNTER — Other Ambulatory Visit: Payer: Self-pay

## 2021-06-04 ENCOUNTER — Other Ambulatory Visit (INDEPENDENT_AMBULATORY_CARE_PROVIDER_SITE_OTHER): Payer: BC Managed Care – PPO

## 2021-06-04 DIAGNOSIS — Z1329 Encounter for screening for other suspected endocrine disorder: Secondary | ICD-10-CM

## 2021-06-08 LAB — TSH: TSH: 0.5 u[IU]/mL (ref 0.35–5.50)

## 2021-06-10 ENCOUNTER — Other Ambulatory Visit: Payer: Self-pay | Admitting: Nurse Practitioner

## 2021-06-11 ENCOUNTER — Other Ambulatory Visit: Payer: Self-pay

## 2021-06-11 ENCOUNTER — Telehealth: Payer: Self-pay | Admitting: Family Medicine

## 2021-06-11 DIAGNOSIS — I10 Essential (primary) hypertension: Secondary | ICD-10-CM

## 2021-06-11 MED ORDER — LOSARTAN POTASSIUM 50 MG PO TABS: 50.0000 mg | ORAL_TABLET | ORAL | 1 refills | Status: DC

## 2021-06-11 MED ORDER — AMLODIPINE BESYLATE 5 MG PO TABS: 5.0000 mg | ORAL_TABLET | ORAL | 3 refills | Status: DC

## 2021-06-11 NOTE — Telephone Encounter (Signed)
Refill sent to pharmacy.   

## 2021-06-11 NOTE — Telephone Encounter (Signed)
Pt need refill on amLODipine and [facility]artan [facility] [address] ?

## 2021-08-13 ENCOUNTER — Encounter: Payer: Self-pay | Admitting: Family Medicine

## 2021-08-13 ENCOUNTER — Ambulatory Visit: Payer: BC Managed Care – PPO | Admitting: Family Medicine

## 2021-08-13 DIAGNOSIS — E669 Obesity, unspecified: Secondary | ICD-10-CM | POA: Diagnosis not present

## 2021-08-13 DIAGNOSIS — I10 Essential (primary) hypertension: Secondary | ICD-10-CM

## 2021-08-13 DIAGNOSIS — W19XXXA Unspecified fall, initial encounter: Secondary | ICD-10-CM | POA: Diagnosis not present

## 2021-08-13 DIAGNOSIS — M7542 Impingement syndrome of left shoulder: Secondary | ICD-10-CM | POA: Diagnosis not present

## 2021-08-13 NOTE — Assessment & Plan Note (Addendum)
Discussed that she needs to work on adding in exercise with walking and reducing her cookie intake.  Discussed healthy diet with plenty of fruits and vegetables as well as lean meats.  Discussed that medication for weight loss may be an option if she is not able to lose weight with diet and exercise.  Discussed that she would need to have worked on diet and exercise prior to starting on a medication for weight loss.

## 2021-08-13 NOTE — Assessment & Plan Note (Signed)
Well-controlled.  She will continue amlodipine 5 mg once daily and losartan 50 mg daily.

## 2021-08-13 NOTE — Patient Instructions (Addendum)
Nice to see you. Please try to do the exercises for your shoulder 3 times a week. Please try to add in some exercise with walking and reduce your cookie intake.  Shoulder Impingement Syndrome Rehab Ask your health care provider which exercises are safe for you. Do exercises exactly as told by your health care provider and adjust them as directed. It is normal to feel mild stretching, pulling, tightness, or discomfort as you do these exercises. Stop right away if you feel sudden pain or your pain gets worse. Do not begin these exercises until told by your health care provider. Stretching and range-of-motion exercise This exercise warms up your muscles and joints and improves the movement and flexibility of your shoulder. This exercise also helps to relieve pain and stiffness. Passive horizontal adduction In passive adduction, you use your other hand to move the injured arm toward your body. The injured arm does not move on its own. In this movement, your arm is moved across your body in the horizontal plane (horizontal adduction). Sit or stand and pull your left / right elbow across your chest, toward your other shoulder. Stop when you feel a gentle stretch in the back of your shoulder and upper arm. Keep your arm at shoulder height. Keep your arm as close to your body as you comfortably can. Hold for __________ seconds. Slowly return to the starting position. Repeat __________ times. Complete this exercise __________ times a day. Strengthening exercises These exercises build strength and endurance in your shoulder. Endurance is the ability to use your muscles for a long time, even after they get tired. External rotation, isometric This is an exercise in which you press the back of your wrist against a door frame without moving your shoulder joint (isometric). Stand or sit in a doorway, facing the door frame. Bend your left / right elbow and place the back of your wrist against the door frame.  Only the back of your wrist should be touching the frame. Keep your upper arm at your side. Gently press your wrist against the door frame, as if you are trying to push your arm away from your abdomen (external rotation). Press as hard as you are able without pain. Avoid shrugging your shoulder while you press your wrist against the door frame. Keep your shoulder blade tucked down toward the middle of your back. Hold for __________ seconds. Slowly release the tension, and relax your muscles completely before you repeat the exercise. Repeat __________ times. Complete this exercise __________ times a day. Internal rotation, isometric This is an exercise in which you press your palm against a door frame without moving your shoulder joint (isometric). Stand or sit in a doorway, facing the door frame. Bend your left / right elbow and place the palm of your hand against the door frame. Only your palm should be touching the frame. Keep your upper arm at your side. Gently press your hand against the door frame, as if you are trying to push your arm toward your abdomen (internal rotation). Press as hard as you are able without pain. Avoid shrugging your shoulder while you press your hand against the door frame. Keep your shoulder blade tucked down toward the middle of your back. Hold for __________ seconds. Slowly release the tension, and relax your muscles completely before you repeat the exercise. Repeat __________ times. Complete this exercise __________ times a day. Scapular protraction, supine  Lie on your back on a firm surface (supine position). Hold a __________ weight  in your left / right hand. Raise your left / right arm straight into the air so your hand is directly above your shoulder joint. Push the weight into the air so your shoulder (scapula) lifts off the surface that you are lying on. The scapula will push up or forward (protraction). Do not move your head, neck, or back. Hold for  __________ seconds. Slowly return to the starting position. Let your muscles relax completely before you repeat this exercise. Repeat __________ times. Complete this exercise __________ times a day. Scapular retraction  Sit in a stable chair without armrests, or stand up. Secure an exercise band to a stable object in front of you so the band is at shoulder height. Hold one end of the exercise band in each hand. Your palms should face down. Squeeze your shoulder blades together (retraction) and move your elbows slightly behind you. Do not shrug your shoulders upward while you do this. Hold for __________ seconds. Slowly return to the starting position. Repeat __________ times. Complete this exercise __________ times a day. Shoulder extension  Sit in a stable chair without armrests, or stand up. Secure an exercise band to a stable object in front of you so the band is above shoulder height. Hold one end of the exercise band in each hand. Straighten your elbows and lift your hands up to shoulder height. Squeeze your shoulder blades together and pull your hands down to the sides of your thighs (extension). Stop when your hands are straight down by your sides. Do not let your hands go behind your body. Hold for __________ seconds. Slowly return to the starting position. Repeat __________ times. Complete this exercise __________ times a day. This information is not intended to replace advice given to you by your health care provider. Make sure you discuss any questions you have with your health care provider. Document Revised: 06/22/2018 Document Reviewed: 03/26/2018 Elsevier Patient Education  [place].

## 2021-08-13 NOTE — Assessment & Plan Note (Signed)
Discussed she likely has rotator cuff impingement.  This appears to be mild based on her exam and her description of symptoms.  We will have her complete exercises at home.  She can continue as needed over-the-counter Tylenol for her pain.

## 2021-08-13 NOTE — Progress Notes (Signed)
Alyssa Rumps, MD Phone: [phone]  Alyssa Matthews is a 61 y.o. female who presents today for follow-up.  HYPERTENSION Disease Monitoring Home BP Monitoring not checking Chest pain- no    Dyspnea- no Medications Compliance-  taking amlodipine, losartan  Edema- no BMET    Component Value Date/Time   NA 143 05/10/2021 0805   NA CANCELED 09/25/2018 0851   NA 142 12/11/2013 1054   K 3.9 05/10/2021 0805   K 3.7 12/11/2013 1054   CL 109 05/10/2021 0805   CL 109 (H) 12/11/2013 1054   CO2 27 05/10/2021 0805   CO2 28 12/11/2013 1054   GLUCOSE 105 (H) 05/10/2021 0805   GLUCOSE 96 12/11/2013 1054   BUN 19 05/10/2021 0805   BUN CANCELED 09/25/2018 0851   BUN 9 12/11/2013 1054   CREATININE 0.71 05/10/2021 0805   CREATININE 0.70 12/11/2013 1054   CALCIUM 9.1 05/10/2021 0805   CALCIUM 8.5 12/11/2013 1054   GFRNONAA >60 06/27/2019 1635   GFRNONAA >60 12/11/2013 1054   GFRAA >60 06/27/2019 1635   GFRAA >60 12/11/2013 1054   Obesity: Patient notes she has not been exercising.  She eats lots of cookies and popcorn.  She has 1 soda per day.  Fall: Notes she fell 1 month ago when she tripped on her husband's foot.  She notes her left knee hurt though that has resolved.  Left shoulder pain: Patient notes this occurs intermittently.  It hurts over her anterior left shoulder.  Feels like it is a tight muscle.  No radiation.  She works on an [employer], television/film set.  Mostly feels the discomfort when she is in bed.  None in the past week.  No injury.  Tylenol is helpful for the discomfort.  Social History   Tobacco Use  Smoking Status Former   Packs/day: 0.75   Years: 30.00   Pack years: 22.50   Types: Cigarettes   Quit date: 08/2017   Years since quitting: 4.0  Smokeless Tobacco Never    Current Outpatient Medications on File Prior to Visit  Medication Sig Dispense Refill   acetaminophen (TYLENOL) 500 MG tablet Take 1,000 mg by mouth every 6 (six) hours as needed.     amLODipine  (NORVASC) 5 MG tablet Take 1 tablet (5 mg total) by mouth every morning. 90 tablet 3   aspirin EC 81 MG tablet Take 81 mg by mouth daily.     clopidogrel (PLAVIX) 75 MG tablet Take 1 tablet (75 mg total) by mouth daily. 90 tablet 3   HYDROcodone-acetaminophen (NORCO) 5-325 MG tablet Take 1-2 tablets by mouth every 6 (six) hours as needed for moderate pain or severe pain. MAXIMUM TOTAL ACETAMINOPHEN DOSE IS 4000 MG PER DAY 30 tablet 0   losartan (COZAAR) 50 MG tablet Take 1 tablet (50 mg total) by mouth every morning. 90 tablet 1   Multiple Vitamin (MULTIVITAMIN WITH MINERALS) TABS tablet Take 1 tablet by mouth daily.     potassium chloride (KLOR-CON) 10 MEQ tablet TAKE 1 TABLET BY MOUTH EVERY DAY 90 tablet 3   rosuvastatin (CRESTOR) 40 MG tablet TAKE 1 TABLET BY MOUTH EVERY DAY 90 tablet 2   No current facility-administered medications on file prior to visit.     ROS see history of present illness  Objective  Physical Exam Vitals:   08/13/21 0942  BP: 120/80  Pulse: 70  Temp: 97.7 F (36.5 C)  SpO2: 98%    BP Readings from Last 3 Encounters:  08/13/21 120/80  05/12/21  120/78  02/02/21 130/70   Wt Readings from Last 3 Encounters:  08/13/21 185 lb 9.6 oz (84.2 kg)  05/12/21 181 lb 6.4 oz (82.3 kg)  02/02/21 178 lb 6.4 oz (80.9 kg)    Physical Exam Constitutional:      General: She is not in acute distress.    Appearance: She is not diaphoretic.  Cardiovascular:     Rate and Rhythm: Normal rate and regular rhythm.     Heart sounds: Normal heart sounds.  Pulmonary:     Effort: Pulmonary effort is normal.     Breath sounds: Normal breath sounds.  Musculoskeletal:     Comments: Bilateral shoulders with full range of motion actively and passively, discomfort on passive external range of motion in the left shoulder, positive empty can on the left, negative speeds  Skin:    General: Skin is warm and dry.  Neurological:     Mental Status: She is alert.      Assessment/Plan: Please see individual problem list.  Problem List Items Addressed This Visit     Essential hypertension (Chronic)    Well-controlled.  She will continue amlodipine 5 mg once daily and losartan 50 mg daily.       Obesity (BMI 30.0-34.9) (Chronic)    Discussed that she needs to work on adding in exercise with walking and reducing her cookie intake.  Discussed healthy diet with plenty of fruits and vegetables as well as lean meats.  Discussed that medication for weight loss may be an option if she is not able to lose weight with diet and exercise.  Discussed that she would need to have worked on diet and exercise prior to starting on a medication for weight loss.       Fall    Status post mechanical fall.  Discussed monitoring where she is going more carefully.       Rotator cuff impingement syndrome of left shoulder    Discussed she likely has rotator cuff impingement.  This appears to be mild based on her exam and her description of symptoms.  We will have her complete exercises at home.  She can continue as needed over-the-counter Tylenol for her pain.         Return in about 3 months (around 11/13/2021) for weight f/u.   Alyssa Rumps, MD [place]

## 2021-08-13 NOTE — Assessment & Plan Note (Signed)
Status post mechanical fall.  Discussed monitoring where she is going more carefully.

## 2021-09-09 ENCOUNTER — Other Ambulatory Visit: Payer: Self-pay | Admitting: Family Medicine

## 2021-09-09 DIAGNOSIS — I10 Essential (primary) hypertension: Secondary | ICD-10-CM

## 2021-09-26 ENCOUNTER — Other Ambulatory Visit: Payer: Self-pay | Admitting: Physician Assistant

## 2021-09-27 ENCOUNTER — Telehealth: Payer: Self-pay

## 2021-09-27 NOTE — Telephone Encounter (Signed)
Patient states she needs a refill of her potassium chloride (KLOR-CON) 10 MEQ tablet.    *Patient states [facility] preferred [facility] is [facility] [address].

## 2021-09-28 ENCOUNTER — Other Ambulatory Visit: Payer: Self-pay

## 2021-09-28 DIAGNOSIS — I10 Essential (primary) hypertension: Secondary | ICD-10-CM

## 2021-09-28 NOTE — Telephone Encounter (Signed)
I called and scheduled a lab appointment with the patient to check her potassium. Dejanira Pamintuan,cma

## 2021-09-29 ENCOUNTER — Other Ambulatory Visit (INDEPENDENT_AMBULATORY_CARE_PROVIDER_SITE_OTHER): Payer: BC Managed Care – PPO

## 2021-09-29 DIAGNOSIS — I10 Essential (primary) hypertension: Secondary | ICD-10-CM

## 2021-09-29 LAB — COMPREHENSIVE METABOLIC PANEL
ALT: 14 U/L (ref 0–35)
AST: 16 U/L (ref 0–37)
Albumin: 4.2 g/dL (ref 3.5–5.2)
Alkaline Phosphatase: 83 U/L (ref 39–117)
BUN: 12 mg/dL (ref 6–23)
CO2: 27 mEq/L (ref 19–32)
Calcium: 8.9 mg/dL (ref 8.4–10.5)
Chloride: 106 mEq/L (ref 96–112)
Creatinine, Ser: 0.67 mg/dL (ref 0.40–1.20)
GFR: 95.96 mL/min (ref >60.00)
Glucose, Bld: 96 mg/dL (ref 70–99)
Potassium: 3.9 mEq/L (ref 3.5–5.1)
Sodium: 141 mEq/L (ref 135–145)
Total Bilirubin: 0.3 mg/dL (ref 0.2–1.2)
Total Protein: 7 g/dL (ref 6.0–8.3)

## 2021-10-09 ENCOUNTER — Other Ambulatory Visit: Payer: Self-pay | Admitting: Family Medicine

## 2021-10-09 DIAGNOSIS — E785 Hyperlipidemia, unspecified: Secondary | ICD-10-CM

## 2021-10-09 DIAGNOSIS — G459 Transient cerebral ischemic attack, unspecified: Secondary | ICD-10-CM

## 2021-10-14 NOTE — Progress Notes (Signed)
Cardiology Office Note  Date:  10/15/2021   ID:  Alyssa Matthews, DOB [birth year], MRN [id number]  PCP:  Leone Haven, MD   Chief Complaint  Patient presents with   12 month follow up     "Doing well." Medications reviewed by the patient verbally.     HPI:  Alyssa Matthews is a 61 year old woman with past medical history of Smoker, quit  2019 Hypertension Weight loss Thyroid abnormality    Who presents for follow-up of her shortness of breath on exertion, chest pain 3 grandchildren, 1 to 46 yo  Last seen by myself in clinic October 2018, seen at that time for shortness of breath and chest pain Seen by one of our providers June 2022  Echocardiogram September 2021   June 2019, she was admitted with left-sided facial numbness and left lower extremity numbness, that subsequently resolved.   CT of the head was negative.    MRI of the brain showed remote lacunar infarct involving the pons.    Carotid ultrasound did not show any significant stenosis.    Echocardiogram showed an EF of 60-65% without regional wall motion abnormalities, grade 1 diastolic dysfunction, mild MR, and a PFO with positive saline contrast study.  PASP was normal.    neurology and placed on aspirin and this was subsequently switched to Plavix as an outpatient.  PFO was reviewed by structural heart team later in 2019 and they felt that the PFO was small but that given multiple uncontrolled risk factors for stroke, conservative therapy was recommended.  Lots of bruiising Was take some asa as well plavix  Lab work reviewed A1C 6.3 Cholesterol at goal, 138  Reports that she quit smoking 2019 Trying to lose weight, trying to increase her exercise  Family hx Mom PAD, stent, LE arterial bypass, smoker Dad still smoking, smokign XRT,chemo Aunt smoking, cancer, chemo  EKG personally reviewed by myself on todays visit Normal sinus rhythm rate 64 bpm right bundle branch block   PMH:   has a past medical  history of Abdominal pain, left lower quadrant (06/29/2015), Anemia, Arthritis, Benign neoplasm of sigmoid colon, Bilateral numbness of feet (06/29/2015), Chronic left shoulder pain (05/17/2017), Decreased sex drive (64/33/2951), Dyspnea on exertion (04/18/2016), Elevated LDL cholesterol level (06/29/2015), History of blood transfusion, History of tobacco abuse (05/18/2015), Hypercholesteremia, Hypertension, Lacunar infarction [facility]), Lipoma (04/16/2015), Nodule of left lobe of thyroid gland, PFO (patent foramen ovale), Prediabetes (05/18/2015), Rib contusion, left, initial encounter (06/28/2016), Sebaceous cyst of labia (12/05/2016), Shoulder pain, bilateral (01/15/2015), Statin intolerance (05/18/2015), Subclinical hyperthyroidism (06/28/2016), TIA (transient ischemic attack), Vulvar lesion (10/31/2016), Wears dentures, and Weight loss (10/31/2016).  PSH:    Past Surgical History:  Procedure Laterality Date   BREAST CYST ASPIRATION  2015   COLONOSCOPY WITH PROPOFOL N/A 03/30/2015   Procedure: COLONOSCOPY WITH PROPOFOL;  Surgeon: Lucilla Lame, MD;  Location: [facility];  Service: Endoscopy;  Laterality: N/A;   DILATION AND CURETTAGE OF UTERUS     KNEE ARTHROSCOPY Right 01/06/2021   Procedure: KNEE ARTHROSCOPY WITH DEBRIDEMENT AND MENISCAL REPAIR;  Surgeon: Corky Mull, MD;  Location: [facility];  Service: Orthopedics;  Laterality: Right;   POLYPECTOMY  03/30/2015   Procedure: POLYPECTOMY;  Surgeon: Lucilla Lame, MD;  Location: [facility];  Service: Endoscopy;;    Current Outpatient Medications  Medication Sig Dispense Refill   acetaminophen (TYLENOL) 500 MG tablet Take 1,000 mg by mouth every 6 (six) hours as needed.     amLODipine (NORVASC) 5 MG tablet  Take 1 tablet (5 mg total) by mouth every morning. 90 tablet 3   aspirin EC 81 MG tablet Take 81 mg by mouth daily.     clopidogrel (PLAVIX) 75 MG tablet TAKE 1 TABLET BY MOUTH EVERY DAY 90 tablet 3   HYDROcodone-acetaminophen  (NORCO) 5-325 MG tablet Take 1-2 tablets by mouth every 6 (six) hours as needed for moderate pain or severe pain. MAXIMUM TOTAL ACETAMINOPHEN DOSE IS 4000 MG PER DAY 30 tablet 0   losartan (COZAAR) 50 MG tablet Take 1 tablet by mouth daily.     Multiple Vitamin (MULTIVITAMIN WITH MINERALS) TABS tablet Take 1 tablet by mouth daily.     potassium chloride (KLOR-CON) 10 MEQ tablet TAKE 1 TABLET BY MOUTH EVERY DAY 30 tablet 0   rosuvastatin (CRESTOR) 40 MG tablet TAKE 1 TABLET BY MOUTH EVERY DAY 90 tablet 2   No current facility-administered medications for this visit.     Allergies:   Patient has no known allergies.   Social History:  The patient  reports that she quit smoking about 4 years ago. Her smoking use included cigarettes. She has a 22.50 pack-year smoking history. She has never used smokeless tobacco. She reports current alcohol use. She reports that she does not use drugs.   Family History:   family history includes Cancer in her father; Drug abuse in her paternal grandmother; Hypertension in her brother and mother; Kidney disease in her brother.    Review of Systems: Review of Systems  Constitutional: Negative.   Respiratory: Negative.    Cardiovascular: Negative.   Gastrointestinal: Negative.   Musculoskeletal: Negative.   Neurological: Negative.   Psychiatric/Behavioral: Negative.    All other systems reviewed and are negative.   PHYSICAL EXAM: VS:  BP 106/64 (BP Location: Left Arm, Patient Position: Sitting, Cuff Size: Normal)   Pulse 64   Ht '5\' 5"'$  (1.651 m)   Wt 182 lb (82.6 kg)   SpO2 98%   BMI 30.29 kg/m  , BMI Body mass index is 30.29 kg/m. Constitutional:  oriented to person, place, and time. No distress.  HENT:  Head: Grossly normal Eyes:  no discharge. No scleral icterus.  Neck: No JVD, no carotid bruits  Cardiovascular: Regular rate and rhythm, no murmurs appreciated Pulmonary/Chest: Clear to auscultation bilaterally, no wheezes or rails Abdominal:  Soft.  no distension.  no tenderness.  Musculoskeletal: Normal range of motion Neurological:  normal muscle tone. Coordination normal. No atrophy Skin: Skin warm and dry Psychiatric: normal affect, pleasant  Recent Labs: 12/30/2020: Hemoglobin 11.5; Platelets 278 06/04/2021: TSH 0.50 09/29/2021: ALT 14; BUN 12; Creatinine, Ser 0.67; Potassium 3.9; Sodium 141    Lipid Panel Lab Results  Component Value Date   CHOL 138 05/10/2021   HDL 68.10 05/10/2021   LDLCALC 53 05/10/2021   TRIG 85.0 05/10/2021      Wt Readings from Last 3 Encounters:  10/15/21 182 lb (82.6 kg)  08/13/21 185 lb 9.6 oz (84.2 kg)  05/12/21 181 lb 6.4 oz (82.3 kg)     ASSESSMENT AND PLAN:  PFO/hx of TIA/CVA Suggest plavix daily No further events   Dyspnea on exertion -  Long smokign hx Stable sx Continue exercise  HTN Monitor blood pressure at home using mother's blood pressure cuff Blood pressure little bit low today recommend she move one of her blood pressure medications to the evening If blood pressure continues to run low or any orthostasis symptoms may need to decrease dosing of amlodipine or losartan  Tobacco  abuse Quit 2019  Elevated glucose A1C >6 We have encouraged continued exercise, careful diet management in an effort to lose weight.  Subclinical hyperthyroid  Followed by PMD   Total encounter time more than 30 minutes  Greater than 50% was spent in counseling and coordination of care with the patient   Orders Placed This Encounter  Procedures   EKG 12-Lead     Signed, Esmond Plants, M.D., Ph.D. 10/15/2021  [facility] [place], [place]

## 2021-10-15 ENCOUNTER — Encounter: Payer: Self-pay | Admitting: Cardiovascular Disease

## 2021-10-15 ENCOUNTER — Ambulatory Visit: Payer: BC Managed Care – PPO | Admitting: Cardiovascular Disease

## 2021-10-15 VITALS — BP 106/64 | HR 64 | Ht 65.0 in | Wt 182.0 lb

## 2021-10-15 DIAGNOSIS — E782 Mixed hyperlipidemia: Secondary | ICD-10-CM | POA: Diagnosis not present

## 2021-10-15 DIAGNOSIS — R0602 Shortness of breath: Secondary | ICD-10-CM

## 2021-10-15 DIAGNOSIS — R079 Chest pain, unspecified: Secondary | ICD-10-CM

## 2021-10-15 DIAGNOSIS — I10 Essential (primary) hypertension: Secondary | ICD-10-CM

## 2021-10-15 NOTE — Patient Instructions (Signed)
Medication Instructions:  Move one of the blood pressure pills to the evening If you get lightheaded when standing up, call the office  Stop aspirin, stay on plavix daily  If you need a refill on your cardiac medications before your next appointment, please call your pharmacy.   Lab work: No new labs needed  Testing/Procedures: No new testing needed  Follow-Up: At [facility], you and your health needs are our priority.  As part of our continuing mission to provide you with exceptional heart care, we have created designated Provider Care Teams.  These Care Teams include your primary Cardiologist (physician) and Advanced Practice Providers (APPs -  Physician Assistants and Nurse Practitioners) who all work together to provide you with the care you need, when you need it.  You will need a follow up appointment in 12 months  Providers on your designated Care Team:   Murray Hodgkins, NP Christell Faith, PA-C Cadence Kathlen Mody, [place]  COVID-19 Vaccine Information can be found at: [email or web address].uk For questions related to vaccine distribution or appointments, please email [email or web address] or call [phone].

## 2021-10-23 ENCOUNTER — Other Ambulatory Visit: Payer: Self-pay | Admitting: Physician Assistant

## 2021-11-26 ENCOUNTER — Ambulatory Visit: Payer: BC Managed Care – PPO | Admitting: Family Medicine

## 2021-11-26 ENCOUNTER — Encounter: Payer: Self-pay | Admitting: Family Medicine

## 2021-11-26 VITALS — BP 118/80 | HR 68 | Temp 98.2°F | Ht 65.0 in | Wt 184.4 lb

## 2021-11-26 DIAGNOSIS — R7303 Prediabetes: Secondary | ICD-10-CM | POA: Diagnosis not present

## 2021-11-26 DIAGNOSIS — E669 Obesity, unspecified: Secondary | ICD-10-CM | POA: Diagnosis not present

## 2021-11-26 DIAGNOSIS — Z1231 Encounter for screening mammogram for malignant neoplasm of breast: Secondary | ICD-10-CM

## 2021-11-26 DIAGNOSIS — Z8673 Personal history of transient ischemic attack (TIA), and cerebral infarction without residual deficits: Secondary | ICD-10-CM | POA: Diagnosis not present

## 2021-11-26 DIAGNOSIS — I10 Essential (primary) hypertension: Secondary | ICD-10-CM

## 2021-11-26 LAB — POCT GLYCOSYLATED HEMOGLOBIN (HGB A1C): Hemoglobin A1C: 5.7 % — AB (ref 4.0–5.6)

## 2021-11-26 NOTE — Assessment & Plan Note (Signed)
She will continue Plavix 75 mg once daily.  She will monitor for any recurrent symptoms.

## 2021-11-26 NOTE — Progress Notes (Signed)
Tommi Rumps, MD Phone: [phone]  Alyssa Matthews is a 61 y.o. female who presents today for f/u.  HYPERTENSION Disease Monitoring Home BP Monitoring not checking consistently though notes it is not high or low when she does check it Chest pain- no    Dyspnea- no Medications Compliance-  taking amlodipine, losartan.  Edema- no BMET    Component Value Date/Time   NA 141 09/29/2021 1122   NA CANCELED 09/25/2018 0851   NA 142 12/11/2013 1054   K 3.9 09/29/2021 1122   K 3.7 12/11/2013 1054   CL 106 09/29/2021 1122   CL 109 (H) 12/11/2013 1054   CO2 27 09/29/2021 1122   CO2 28 12/11/2013 1054   GLUCOSE 96 09/29/2021 1122   GLUCOSE 96 12/11/2013 1054   BUN 12 09/29/2021 1122   BUN CANCELED 09/25/2018 0851   BUN 9 12/11/2013 1054   CREATININE 0.67 09/29/2021 1122   CREATININE 0.70 12/11/2013 1054   CALCIUM 8.9 09/29/2021 1122   CALCIUM 8.5 12/11/2013 1054   GFRNONAA >60 06/27/2019 1635   GFRNONAA >60 12/11/2013 1054   GFRAA >60 06/27/2019 1635   GFRAA >60 12/11/2013 1054   History of TIA: Patient reports she saw her neurologist earlier this year and noted some bruising to him.  He advised that she could stop the aspirin and could take the Plavix every other day.  Review of the note seems to indicate that he advised Plavix every other day and continuing aspirin 81 mg daily.  She notes she subsequently followed up with cardiology who advised she could be off of the aspirin though should take the Plavix daily.  She has not had any TIA symptoms in many years.  Obesity: Patient notes she was able to lose 4 pounds by increasing her walking and cutting back on sweet intake.  Her work schedule changed and she has not been able to walk over the last several weeks.  She has gained 4 pounds back.  Social History   Tobacco Use  Smoking Status Former   Packs/day: 0.75   Years: 30.00   Total pack years: 22.50   Types: Cigarettes   Quit date: 08/2017   Years since quitting: 4.2   Smokeless Tobacco Never    Current Outpatient Medications on File Prior to Visit  Medication Sig Dispense Refill   acetaminophen (TYLENOL) 500 MG tablet Take 1,000 mg by mouth every 6 (six) hours as needed.     amLODipine (NORVASC) 5 MG tablet Take 1 tablet (5 mg total) by mouth every morning. 90 tablet 3   clopidogrel (PLAVIX) 75 MG tablet TAKE 1 TABLET BY MOUTH EVERY DAY 90 tablet 3   HYDROcodone-acetaminophen (NORCO) 5-325 MG tablet Take 1-2 tablets by mouth every 6 (six) hours as needed for moderate pain or severe pain. MAXIMUM TOTAL ACETAMINOPHEN DOSE IS 4000 MG PER DAY 30 tablet 0   losartan (COZAAR) 50 MG tablet Take 1 tablet by mouth daily.     Multiple Vitamin (MULTIVITAMIN WITH MINERALS) TABS tablet Take 1 tablet by mouth daily.     potassium chloride (KLOR-CON) 10 MEQ tablet TAKE 1 TABLET BY MOUTH EVERY DAY 30 tablet 5   rosuvastatin (CRESTOR) 40 MG tablet TAKE 1 TABLET BY MOUTH EVERY DAY 90 tablet 2   No current facility-administered medications on file prior to visit.     ROS see history of present illness  Objective  Physical Exam Vitals:   11/26/21 0837  BP: 118/80  Pulse: 68  Temp: 98.2 F (  36.8 C)  SpO2: 98%    BP Readings from Last 3 Encounters:  11/26/21 118/80  10/15/21 106/64  08/13/21 120/80   Wt Readings from Last 3 Encounters:  11/26/21 184 lb 6.4 oz (83.6 kg)  10/15/21 182 lb (82.6 kg)  08/13/21 185 lb 9.6 oz (84.2 kg)    Physical Exam Constitutional:      General: She is not in acute distress.    Appearance: She is not diaphoretic.  Cardiovascular:     Rate and Rhythm: Normal rate and regular rhythm.     Heart sounds: Normal heart sounds.  Pulmonary:     Effort: Pulmonary effort is normal.     Breath sounds: Normal breath sounds.  Skin:    General: Skin is warm and dry.  Neurological:     Mental Status: She is alert.      Assessment/Plan: Please see individual problem list.  Problem List Items Addressed This Visit      Essential hypertension (Chronic)    Adequate control.  She will continue amlodipine 5 mg daily and losartan 50 mg daily.      Obesity (BMI 30.0-34.9) (Chronic)    Encouraged to try to add her walking back in.  Discussed if she is only able to do this for 15 minutes a few days a week that is better than doing nothing.  Discussed working on cutting down on sweet intake.  She has been drinking [facility] before work and I suggested she change this to drinking coffee with no added sugar if she needs caffeine prior to work.      Prediabetes - Primary (Chronic)    Check A1c.  Continue to work on diet and exercise.      Relevant Orders   POCT HgB A1C   History of TIA (transient ischemic attack)    She will continue Plavix 75 mg once daily.  She will monitor for any recurrent symptoms.      Other Visit Diagnoses     Encounter for screening mammogram for malignant neoplasm of breast       Relevant Orders   MM 3D SCREEN BREAST BILATERAL        Health Maintenance: She defers tetanus shot today.  She will check with her insurance regarding coverage for the shingles vaccine.  She will call to schedule her mammogram.  Return in about 3 months (around 02/25/2022) for weight follow-up.   Tommi Rumps, MD [place]

## 2021-11-26 NOTE — Assessment & Plan Note (Signed)
Encouraged to try to add her walking back in.  Discussed if she is only able to do this for 15 minutes a few days a week that is better than doing nothing.  Discussed working on cutting down on sweet intake.  She has been drinking [facility] before work and I suggested she change this to drinking coffee with no added sugar if she needs caffeine prior to work.

## 2021-11-26 NOTE — Assessment & Plan Note (Signed)
Check A1c.  Continue to work on diet and exercise.

## 2021-11-26 NOTE — Patient Instructions (Signed)
Nice to see you. Please call to schedule your mammogram. Please continue to work on dietary changes and exercise as we discussed.

## 2021-11-26 NOTE — Assessment & Plan Note (Signed)
Adequate control.  She will continue amlodipine 5 mg daily and losartan 50 mg daily.

## 2021-11-29 DIAGNOSIS — I10 Essential (primary) hypertension: Secondary | ICD-10-CM | POA: Diagnosis not present

## 2021-11-29 DIAGNOSIS — R200 Anesthesia of skin: Secondary | ICD-10-CM | POA: Diagnosis not present

## 2021-11-29 DIAGNOSIS — Z8673 Personal history of transient ischemic attack (TIA), and cerebral infarction without residual deficits: Secondary | ICD-10-CM | POA: Diagnosis not present

## 2021-11-29 DIAGNOSIS — R29898 Other symptoms and signs involving the musculoskeletal system: Secondary | ICD-10-CM | POA: Diagnosis not present

## 2021-12-27 ENCOUNTER — Ambulatory Visit
Admission: RE | Admit: 2021-12-27 | Discharge: 2021-12-27 | Disposition: A | Discharge status: Home / Self Care | Payer: BC Managed Care – PPO | Source: Ambulatory Visit | Attending: Family Medicine | Admitting: Family Medicine

## 2021-12-27 DIAGNOSIS — Z1231 Encounter for screening mammogram for malignant neoplasm of breast: Secondary | ICD-10-CM | POA: Insufficient documentation

## 2022-01-02 ENCOUNTER — Other Ambulatory Visit: Payer: Self-pay | Admitting: Family Medicine

## 2022-01-02 DIAGNOSIS — G459 Transient cerebral ischemic attack, unspecified: Secondary | ICD-10-CM

## 2022-01-02 DIAGNOSIS — E785 Hyperlipidemia, unspecified: Secondary | ICD-10-CM

## 2022-01-05 ENCOUNTER — Telehealth: Payer: Self-pay

## 2022-01-05 NOTE — Telephone Encounter (Signed)
Patient states she thinks she may have a UTI.  Patient states she feels the need to urinate every few minutes, but not much comes out.  Patient states when she wipes, she sees light blood.  Patient states she is in menopause, so she should not see blood.  Patient states she started feeling the increase in urgency to go about two weeks ago.  I transferred call to Access Nurse.

## 2022-01-05 NOTE — Telephone Encounter (Signed)
I called the patient and spoke with her and she is scheduled to see T. Volanda Napoleon tomorrow for UTI symptoms.  Drenda Sobecki,cma

## 2022-01-06 ENCOUNTER — Encounter: Payer: Self-pay | Admitting: Family Medicine

## 2022-01-06 ENCOUNTER — Ambulatory Visit: Payer: BC Managed Care – PPO | Admitting: Family Medicine

## 2022-01-06 VITALS — BP 122/72 | HR 70 | Temp 98.2°F | Ht 65.0 in | Wt 183.0 lb

## 2022-01-06 DIAGNOSIS — R350 Frequency of micturition: Secondary | ICD-10-CM | POA: Diagnosis not present

## 2022-01-06 LAB — POCT URINALYSIS DIPSTICK
Bilirubin, UA: NEGATIVE
Glucose, UA: NEGATIVE
Ketones, UA: NEGATIVE
Nitrite, UA: NEGATIVE
Protein, UA: POSITIVE — AB
Spec Grav, UA: 1.02 (ref 1.010–1.025)
Urobilinogen, UA: NEGATIVE E.U./dL — AB
pH, UA: 6 (ref 5.0–8.0)

## 2022-01-06 LAB — URINALYSIS, ROUTINE W REFLEX MICROSCOPIC
Bilirubin Urine: NEGATIVE
Ketones, ur: NEGATIVE
Nitrite: NEGATIVE
Specific Gravity, Urine: 1.02 (ref 1.000–1.030)
Total Protein, Urine: NEGATIVE
Urine Glucose: NEGATIVE
Urobilinogen, UA: 0.2 (ref 0.0–1.0)
pH: 6 (ref 5.0–8.0)

## 2022-01-06 MED ORDER — CEPHALEXIN 500 MG PO CAPS: 500.0000 mg | ORAL_CAPSULE | Freq: Four times a day (QID) | ORAL | 0 refills | Status: DC

## 2022-01-06 NOTE — Patient Instructions (Addendum)
It was a pleasure meeting you today. Thank you for allowing me to take part in your health care.  Our goals for today as we discussed include:  For your urinary frequency Your urine showed some leukocytes, blood and protein.  Could be the beginning of an infection. Will send urine for further evaluation to confirm results. If negative will notify you to stop antibiotics.  If positive may need to switch antibiotics and will notify you if this is the case.   Start Keflex 500 mg 4 times a day, breakfast, lunch, dinner and at bedtime. Take a Probiotic daily and continue for at least 2 weeks after completion of antibiotics Continue to drink cranberry juice daily Continue to increase water intake If worsening symptoms while on antibiotics please notify MD.  Recommend Shingles vaccine.  This is a 2 dose series and can be given at your local pharmacy.  Please talk to your pharmacist about this.   Recommend Tetanus booster if not completed.  Recommend discussing with PCP annual screening for lung cancer  Please follow-up with PCP as scheduled  If you have any questions or concerns, please do not hesitate to call the office at [phone].  I look forward to our next visit and until then take care and stay safe.  Regards,   Carollee Leitz, MD   [facility]

## 2022-01-06 NOTE — Progress Notes (Signed)
    SUBJECTIVE:   CHIEF COMPLAINT / HPI: urinary frequency  Patient presents to clinic urinary frequency for 1 week.  Endorses uncomfortable feeling with voiding.  Feels like not completely emptying her bladder.  Today she noticed a little pink on tissue after wiping herself today but has since resolved.  Took some Azo tablets and has been drinking cranberry juice.  Denies any fevers, back pain, painful urination or abdominal pain.   PERTINENT  PMH / PSH:  HTN  OBJECTIVE:   BP 122/72 (BP Location: Left Arm, Patient Position: Sitting, Cuff Size: Normal)   Pulse 70   Temp 98.2 F (36.8 C) (Oral)   Ht '5\' 5"'$  (1.651 m)   Wt 183 lb (83 kg)   SpO2 99%   BMI 30.45 kg/m    General: Alert, no acute distress Cardio: Normal S1 and S2, RRR, no r/m/g Pulm: CTAB, normal work of breathing Abdomen: Bowel sounds normal. Abdomen soft and non-tender.   ASSESSMENT/PLAN:   Urinary frequency Consistent with acute cystitis.   -Urine positive for Leukocytes -Urine culture sent -Start Keflex 500 mg QID x 5 days, may need to change pending results of culture. -Follow up with PCP if no improvement -Strict return precautions provided   HCM Recommend shingles vaccine Recommend tetanus booster Consider CT low-dose lung cancer screening if meeting criteria.  PDMP Reviewed  Carollee Leitz, MD

## 2022-01-09 LAB — URINE CULTURE
MICRO NUMBER:: 14105364
SPECIMEN QUALITY:: ADEQUATE

## 2022-01-10 ENCOUNTER — Other Ambulatory Visit: Payer: Self-pay | Admitting: Family Medicine

## 2022-01-10 MED ORDER — NITROFURANTOIN MONOHYD MACRO 100 MG PO CAPS: 100.0000 mg | ORAL_CAPSULE | Freq: Two times a day (BID) | ORAL | 0 refills | Status: AC

## 2022-01-17 ENCOUNTER — Encounter: Payer: Self-pay | Admitting: Family Medicine

## 2022-01-17 DIAGNOSIS — R350 Frequency of micturition: Secondary | ICD-10-CM | POA: Insufficient documentation

## 2022-01-17 NOTE — Assessment & Plan Note (Signed)
Consistent with acute cystitis.   -Urine positive for Leukocytes -Urine culture sent -Start Keflex 500 mg QID x 5 days, may need to change pending results of culture. -Follow up with PCP if no improvement -Strict return precautions provided

## 2022-02-25 ENCOUNTER — Ambulatory Visit: Payer: BC Managed Care – PPO | Admitting: Family Medicine

## 2022-04-01 ENCOUNTER — Ambulatory Visit: Payer: BC Managed Care – PPO | Admitting: Family Medicine

## 2022-04-01 ENCOUNTER — Encounter: Payer: Self-pay | Admitting: Family Medicine

## 2022-04-01 VITALS — BP 120/78 | HR 69 | Temp 98.3°F | Ht 65.0 in | Wt 186.4 lb

## 2022-04-01 DIAGNOSIS — M25512 Pain in left shoulder: Secondary | ICD-10-CM

## 2022-04-01 DIAGNOSIS — I10 Essential (primary) hypertension: Secondary | ICD-10-CM

## 2022-04-01 DIAGNOSIS — E785 Hyperlipidemia, unspecified: Secondary | ICD-10-CM | POA: Diagnosis not present

## 2022-04-01 DIAGNOSIS — E669 Obesity, unspecified: Secondary | ICD-10-CM

## 2022-04-01 DIAGNOSIS — M25552 Pain in left hip: Secondary | ICD-10-CM

## 2022-04-01 MED ORDER — ROSUVASTATIN CALCIUM 40 MG PO TABS: 40.0000 mg | ORAL_TABLET | Freq: Every day | ORAL | 2 refills | Status: DC

## 2022-04-01 MED ORDER — AMLODIPINE BESYLATE 5 MG PO TABS: 5.0000 mg | ORAL_TABLET | ORAL | 3 refills | Status: DC

## 2022-04-01 NOTE — Assessment & Plan Note (Signed)
Well-controlled. She will continue amlodipine 5 mg daily and losartan 50 mg daily

## 2022-04-01 NOTE — Patient Instructions (Signed)
Shoulder Impingement Syndrome Rehab Ask your health care provider which exercises are safe for you. Do exercises exactly as told by your health care provider and adjust them as directed. It is normal to feel mild stretching, pulling, tightness, or discomfort as you do these exercises. Stop right away if you feel sudden pain or your pain gets worse. Do not begin these exercises until told by your health care provider. Stretching and range-of-motion exercise This exercise warms up your muscles and joints and improves the movement and flexibility of your shoulder. This exercise also helps to relieve pain and stiffness. Passive horizontal adduction In passive adduction, you use your other hand to move the injured arm toward your body. The injured arm does not move on its own. In this movement, your arm is moved across your body in the horizontal plane (horizontal adduction). Sit or stand and pull your left / right elbow across your chest, toward your other shoulder. Stop when you feel a gentle stretch in the back of your shoulder and upper arm. Keep your arm at shoulder height. Keep your arm as close to your body as you comfortably can. Hold for __________ seconds. Slowly return to the starting position. Repeat __________ times. Complete this exercise __________ times a day. Strengthening exercises These exercises build strength and endurance in your shoulder. Endurance is the ability to use your muscles for a long time, even after they get tired. External rotation, isometric This is an exercise in which you press the back of your wrist against a door frame without moving your shoulder joint (isometric). Stand or sit in a doorway, facing the door frame. Bend your left / right elbow and place the back of your wrist against the door frame. Only the back of your wrist should be touching the frame. Keep your upper arm at your side. Gently press your wrist against the door frame, as if you are trying to  push your arm away from your abdomen (external rotation). Press as hard as you are able without pain. Avoid shrugging your shoulder while you press your wrist against the door frame. Keep your shoulder blade tucked down toward the middle of your back. Hold for __________ seconds. Slowly release the tension, and relax your muscles completely before you repeat the exercise. Repeat __________ times. Complete this exercise __________ times a day. Internal rotation, isometric This is an exercise in which you press your palm against a door frame without moving your shoulder joint (isometric). Stand or sit in a doorway, facing the door frame. Bend your left / right elbow and place the palm of your hand against the door frame. Only your palm should be touching the frame. Keep your upper arm at your side. Gently press your hand against the door frame, as if you are trying to push your arm toward your abdomen (internal rotation). Press as hard as you are able without pain. Avoid shrugging your shoulder while you press your hand against the door frame. Keep your shoulder blade tucked down toward the middle of your back. Hold for __________ seconds. Slowly release the tension, and relax your muscles completely before you repeat the exercise. Repeat __________ times. Complete this exercise __________ times a day. Scapular protraction, supine  Lie on your back on a firm surface (supine position). Hold a __________ weight in your left / right hand. Raise your left / right arm straight into the air so your hand is directly above your shoulder joint. Push the weight into the air so   your shoulder (scapula) lifts off the surface that you are lying on. The scapula will push up or forward (protraction). Do not move your head, neck, or back. Hold for __________ seconds. Slowly return to the starting position. Let your muscles relax completely before you repeat this exercise. Repeat __________ times. Complete this  exercise __________ times a day. Scapular retraction  Sit in a stable chair without armrests, or stand up. Secure an exercise band to a stable object in front of you so the band is at shoulder height. Hold one end of the exercise band in each hand. Your palms should face down. Squeeze your shoulder blades together (retraction) and move your elbows slightly behind you. Do not shrug your shoulders upward while you do this. Hold for __________ seconds. Slowly return to the starting position. Repeat __________ times. Complete this exercise __________ times a day. Shoulder extension  Sit in a stable chair without armrests, or stand up. Secure an exercise band to a stable object in front of you so the band is above shoulder height. Hold one end of the exercise band in each hand. Straighten your elbows and lift your hands up to shoulder height. Squeeze your shoulder blades together and pull your hands down to the sides of your thighs (extension). Stop when your hands are straight down by your sides. Do not let your hands go behind your body. Hold for __________ seconds. Slowly return to the starting position. Repeat __________ times. Complete this exercise __________ times a day. This information is not intended to replace advice given to you by your health care provider. Make sure you discuss any questions you have with your health care provider. Document Revised: 06/22/2018 Document Reviewed: 03/26/2018 Elsevier Patient Education  [place].  Hip Exercises Ask your health care provider which exercises are safe for you. Do exercises exactly as told by your health care provider and adjust them as directed. It is normal to feel mild stretching, pulling, tightness, or discomfort as you do these exercises. Stop right away if you feel sudden pain or your pain gets worse. Do not begin these exercises until told by your health care provider. Stretching and range-of-motion exercises These  exercises warm up your muscles and joints and improve the movement and flexibility of your hip. These exercises also help to relieve pain, numbness, and tingling. You may be asked to limit your range of motion if you had a hip replacement. Talk to your health care provider about these restrictions. Hamstrings, supine  Lie on your back (supine position). Loop a belt or towel over the ball of your left / right foot. The ball of your foot is on the walking surface, right under your toes. Straighten your left / right knee and slowly pull on the belt or towel to raise your leg until you feel a gentle stretch behind your knee (hamstring). Do not let your knee bend while you do this. Keep your other leg flat on the floor. Hold this position for __________ seconds. Slowly return your leg to the starting position. Repeat __________ times. Complete this exercise __________ times a day. Hip rotation  Lie on your back on a firm surface. With your left / right hand, gently pull your left / right knee toward the shoulder that is on the same side of the body. Stop when your knee is pointing toward the ceiling. Hold your left / right ankle with your other hand. Keeping your knee steady, gently pull your left / right ankle  toward your other shoulder until you feel a stretch in your buttocks. Keep your hips and shoulders firmly planted while you do this stretch. Hold this position for __________ seconds. Repeat __________ times. Complete this exercise __________ times a day. Seated stretch This exercise is sometimes called hamstrings and adductors stretch. Sit on the floor with your legs stretched wide. Keep your knees straight during this exercise. Keeping your head and back in a straight line, bend at your waist to reach for your left foot (position A). You should feel a stretch in your right inner thigh (adductors). Hold this position for __________ seconds. Then slowly return to the upright  position. Keeping your head and back in a straight line, bend at your waist to reach forward (position B). You should feel a stretch behind both of your thighs and knees (hamstrings). Hold this position for __________ seconds. Then slowly return to the upright position. Keeping your head and back in a straight line, bend at your waist to reach for your right foot (position C). You should feel a stretch in your left inner thigh (adductors). Hold this position for __________ seconds. Then slowly return to the upright position. Repeat __________ times. Complete this exercise __________ times a day. Lunge This exercise stretches the muscles of the hip (hip flexors). Place your left / right knee on the floor and bend your other knee so that is directly over your ankle. You should be half-kneeling. Keep good posture with your head over your shoulders. Tighten your buttocks to point your tailbone downward. This will prevent your back from arching too much. You should feel a gentle stretch in the front of your left / right thigh and hip. If you do not feel a stretch, slide your other foot forward slightly and then slowly lunge forward with your chest up until your knee once again lines up over your ankle. Make sure your tailbone continues to point downward. Hold this position for __________ seconds. Slowly return to the starting position. Repeat __________ times. Complete this exercise __________ times a day. Strengthening exercises These exercises build strength and endurance in your hip. Endurance is the ability to use your muscles for a long time, even after they get tired. Bridge This exercise strengthens the muscles of your hip (hip extensors). Lie on your back on a firm surface with your knees bent and your feet flat on the floor. Tighten your buttocks muscles and lift your bottom off the floor until the trunk of your body and your hips are level with your thighs. Do not arch your back. You  should feel the muscles working in your buttocks and the back of your thighs. If you do not feel these muscles, slide your feet 1-2 inches (2.5-5 cm) farther away from your buttocks. Hold this position for __________ seconds. Slowly lower your hips to the starting position. Let your muscles relax completely between repetitions. Repeat __________ times. Complete this exercise __________ times a day. Straight leg raises, side-lying This exercise strengthens the muscles that move the hip joint away from the center of the body (hip abductors). Lie on your side with your left / right leg in the top position. Lie so your head, shoulder, hip, and knee line up. You may bend your bottom knee slightly to help you balance. Roll your hips slightly forward, so your hips are stacked directly over each other and your left / right knee is facing forward. Leading with your heel, lift your top leg 4-6 inches (10-15 cm).  You should feel the muscles in your top hip lifting. Do not let your foot drift forward. Do not let your knee roll toward the ceiling. Hold this position for __________ seconds. Slowly return to the starting position. Let your muscles relax completely between repetitions. Repeat __________ times. Complete this exercise __________ times a day. Straight leg raises, side-lying This exercise strengthens the muscles that move the hip joint toward the center of the body (hip adductors). Lie on your side with your left / right leg in the bottom position. Lie so your head, shoulder, hip, and knee line up. You may place your upper foot in front to help you balance. Roll your hips slightly forward, so your hips are stacked directly over each other and your left / right knee is facing forward. Tense the muscles in your inner thigh and lift your bottom leg 4-6 inches (10-15 cm). Hold this position for __________ seconds. Slowly return to the starting position. Let your muscles relax completely between  repetitions. Repeat __________ times. Complete this exercise __________ times a day. Straight leg raises, supine This exercise strengthens the muscles in the front of your thigh (quadriceps). Lie on your back (supine position) with your left / right leg extended and your other knee bent. Tense the muscles in the front of your left / right thigh. You should see your kneecap slide up or see increased dimpling just above your knee. Keep these muscles tight as you raise your leg 4-6 inches (10-15 cm) off the floor. Do not let your knee bend. Hold this position for __________ seconds. Keep these muscles tense as you lower your leg. Relax the muscles slowly and completely between repetitions. Repeat __________ times. Complete this exercise __________ times a day. Hip abductors, standing This exercise strengthens the muscles that move the leg and hip joint away from the center of the body (hip abductors). Tie one end of a rubber exercise band or tubing to a secure surface, such as a chair, table, or pole. Loop the other end of the band or tubing around your left / right ankle. Keeping your ankle with the band or tubing directly opposite the secured end, step away until there is tension in the tubing or band. Hold on to a chair, table, or pole as needed for balance. Lift your left / right leg out to your side. While you do this: Keep your back upright. Keep your shoulders over your hips. Keep your toes pointing forward. Make sure to use your hip muscles to slowly lift your leg. Do not tip your body or forcefully lift your leg. Hold this position for __________ seconds. Slowly return to the starting position. Repeat __________ times. Complete this exercise __________ times a day. Squats This exercise strengthens the muscles in the front of your thigh (quadriceps). Stand in a door frame so your feet and knees are in line with the frame. You may place your hands on the frame for balance. Slowly bend  your knees and lower your hips like you are going to sit in a chair. Keep your lower legs in a straight-up-and-down position. Do not let your hips go lower than your knees. Do not bend your knees lower than told by your health care provider. If your hip pain increases, do not bend as low. Hold this position for ___________ seconds. Slowly push with your legs to return to standing. Do not use your hands to pull yourself to standing. Repeat __________ times. Complete this exercise __________ times a day.  This information is not intended to replace advice given to you by your health care provider. Make sure you discuss any questions you have with your health care provider. Document Revised: 07/15/2020 Document Reviewed: 07/15/2020 Elsevier Patient Education  [place].

## 2022-04-01 NOTE — Assessment & Plan Note (Signed)
Could be rotator cuff impingement or strain. Advised patient to do home exercises to see if this get better. She will let us know if there is no improvement in her pain.

## 2022-04-01 NOTE — Assessment & Plan Note (Signed)
Pain is localized to iliac crest area. Explained to patient that many muscles attach here and may be some muscle strain. Offered some home exercises that may help. Advised patient to come back if pain does not improve.

## 2022-04-01 NOTE — Progress Notes (Signed)
Tommi Rumps, MD Phone: [phone]  Alyssa Matthews is a 62 y.o. female who presents today for f/u.  Hypertension: patient does not check pressures at home. She currently takes amlodipine  '5mg'$  daily and losartan '50mg'$  daily. She denies any chest pain, shortness of breath, or edema.   Obesity: Patient reports that she has not been able to exercise as much recently because she works evenings and in the morning she takes her granddaughter to school. She states that she tends to go into a drive-thru for chicken nuggets and french fries. She tries not to eat too many cookies, crackers, and bread. She does not eat very many fruits and veggies and is "not a fruit person." She is wondering if she can start an oral medication for weight loss. She is trying to [facility] for energy drinks with no sugar.   Left iliac crest pain: patient states that it gets worse when she goes from sitting to standing, or when she lays on her left side at night. Movement and walking does not reproduce the pain. She is able to walk around her job as usual.  Left shoulder pain: patient describes this as intermittent. The pain radiates down her arm and sometimes makes her fingers numb. She shakes her hands and fingers to help with the numbness.   Social History   Tobacco Use  Smoking Status Former   Packs/day: 0.75   Years: 30.00   Total pack years: 22.50   Types: Cigarettes   Quit date: 08/2017   Years since quitting: 4.6  Smokeless Tobacco Never    Current Outpatient Medications on File Prior to Visit  Medication Sig Dispense Refill   acetaminophen (TYLENOL) 500 MG tablet Take 1,000 mg by mouth every 6 (six) hours as needed.     clopidogrel (PLAVIX) 75 MG tablet TAKE 1 TABLET BY MOUTH EVERY DAY 90 tablet 3   HYDROcodone-acetaminophen (NORCO) 5-325 MG tablet Take 1-2 tablets by mouth every 6 (six) hours as needed for moderate pain or severe pain. MAXIMUM TOTAL ACETAMINOPHEN DOSE IS 4000 MG PER DAY 30  tablet 0   losartan (COZAAR) 50 MG tablet Take 1 tablet by mouth daily.     Multiple Vitamin (MULTIVITAMIN WITH MINERALS) TABS tablet Take 1 tablet by mouth daily.     potassium chloride (KLOR-CON) 10 MEQ tablet TAKE 1 TABLET BY MOUTH EVERY DAY 30 tablet 5   No current facility-administered medications on file prior to visit.     ROS see history of present illness  Objective  Physical Exam Vitals:   04/01/22 1434  BP: 120/78  Pulse: 69  Temp: 98.3 F (36.8 C)  SpO2: 98%    BP Readings from Last 3 Encounters:  04/01/22 120/78  01/06/22 122/72  11/26/21 118/80   Wt Readings from Last 3 Encounters:  04/01/22 186 lb 6.4 oz (84.6 kg)  01/06/22 183 lb (83 kg)  11/26/21 184 lb 6.4 oz (83.6 kg)    Physical Exam Constitutional:      Appearance: Normal appearance.  HENT:     Head: Normocephalic and atraumatic.  Cardiovascular:     Rate and Rhythm: Normal rate and regular rhythm.  Pulmonary:     Effort: Pulmonary effort is normal.     Breath sounds: Normal breath sounds.  Musculoskeletal:     Left shoulder: Tenderness present. Normal range of motion.     Comments: Left shoulder: Full range of motion in left shoulder. Passive range of motion elicits some pain on external  rotation, negative empty can testing, negative speeds.  Left hip: pain is localized to the mid iliac crest on palpation of the muscular insertion  Skin:    General: Skin is warm and dry.  Neurological:     Mental Status: She is alert.      Assessment/Plan: Please see individual problem list.  Problem List Items Addressed This Visit     Essential hypertension - Primary (Chronic)    Well-controlled. She will continue amlodipine 5 mg daily and losartan 50 mg daily       Relevant Medications   amLODipine (NORVASC) 5 MG tablet   rosuvastatin (CRESTOR) 40 MG tablet   Hyperlipidemia (Chronic)   Relevant Medications   amLODipine (NORVASC) 5 MG tablet   rosuvastatin (CRESTOR) 40 MG tablet   Obesity  (BMI 30.0-34.9) (Chronic)    Encouraged adding her walking back in. Discussed that it doesn't matter where she walks as long as she's able to get exercise in. Discussed working on cutting down on fried food intake. Explained to patient that weight loss medications may not work as well without a good diet and exercise plan in place. Patient agrees with the plan and indicates understanding.       Left hip pain    Pain is localized to iliac crest area. Explained to patient that many muscles attach here and may be some muscle strain. Offered some home exercises that may help. Advised patient to come back if pain does not improve.      Left shoulder pain    Could be rotator cuff impingement or strain. Advised patient to do home exercises to see if this get better. She will let us know if there is no improvement in her pain.         Return in about 3 months (around 07/01/2022) for weight, HTN.   Marisa Cyphers, Medical Student [place]

## 2022-04-01 NOTE — Assessment & Plan Note (Signed)
Encouraged adding her walking back in. Discussed that it doesn't matter where she walks as long as she's able to get exercise in. Discussed working on cutting down on fried food intake. Explained to patient that weight loss medications may not work as well without a good diet and exercise plan in place. Patient agrees with the plan and indicates understanding.

## 2022-04-01 NOTE — Progress Notes (Signed)
Patient seen along with medical student Zada Zhong.  I personally evaluated this patient along with the student, and verified all aspects of the history, physical exam, and medical decision making as documented by the student.  I agree with the student's documentation and have made all necessary edits.  Jhoana Upham, MD  

## 2022-04-26 ENCOUNTER — Other Ambulatory Visit: Payer: Self-pay | Admitting: Family Medicine

## 2022-05-26 ENCOUNTER — Other Ambulatory Visit: Payer: Self-pay | Admitting: Family Medicine

## 2022-05-26 DIAGNOSIS — I10 Essential (primary) hypertension: Secondary | ICD-10-CM

## 2022-06-29 ENCOUNTER — Telehealth: Payer: Self-pay | Admitting: Family Medicine

## 2022-06-29 NOTE — Telephone Encounter (Signed)
New message    Patient C/o rash everywhere since Saturday.  Applying benadryl gel does not seems like it's not working.    Asking for a call back to discuss

## 2022-06-29 NOTE — Telephone Encounter (Signed)
Saturday Patient 's left arm, chest and back got eat up she believes by mosquitos and she has had a rash and itchy. Sunday night the right arm got rash and itchy. Patient has been using Benadryl Gel and the only thing that is dried up is the left arm. Patient has "checked her house for bed bugs and everything else but can not find anything." Patient states she is going to get Cortisone 10 cream & Benadryl oral medication to see if it will help. Patient would like to know your thoughts on this?

## 2022-06-29 NOTE — Telephone Encounter (Signed)
Can she come in later today to have me look at this?

## 2022-07-01 ENCOUNTER — Ambulatory Visit: Payer: BC Managed Care – PPO | Admitting: Family Medicine

## 2022-07-01 ENCOUNTER — Encounter: Payer: Self-pay | Admitting: Family Medicine

## 2022-07-01 VITALS — BP 116/74 | HR 81 | Temp 98.3°F | Ht 65.0 in | Wt 183.2 lb

## 2022-07-01 DIAGNOSIS — Z8639 Personal history of other endocrine, nutritional and metabolic disease: Secondary | ICD-10-CM

## 2022-07-01 DIAGNOSIS — R21 Rash and other nonspecific skin eruption: Secondary | ICD-10-CM | POA: Insufficient documentation

## 2022-07-01 HISTORY — DX: Personal history of other endocrine, nutritional and metabolic disease: Z86.39

## 2022-07-01 MED ORDER — DOXYCYCLINE HYCLATE 100 MG PO TABS: 100.0000 mg | ORAL_TABLET | Freq: Two times a day (BID) | ORAL | 0 refills | Status: DC

## 2022-07-01 NOTE — Progress Notes (Signed)
Marikay Alar, MD Phone: [phone]  Alyssa Matthews is a 62 y.o. female who presents today for same day visit.   Rash: Patient notes onset of rash 5 days ago.  She started to develop itching on her chest while sitting down in the house.  Subsequently developed some rash on her left upper arm and over her chest and more recently on her right forearm.  She notes headache and fatigue over the last few days with this.  No fevers.  She notes no tick bites or much time spent outside.  She has been using cortisone on it with good benefit.  History of hypokalemia: Patient notes she was on potassium daily though has not been on this in about a month.  Social History   Tobacco Use  Smoking Status Former   Packs/day: 0.75   Years: 30.00   Additional pack years: 0.00   Total pack years: 22.50   Types: Cigarettes   Quit date: 08/2017   Years since quitting: 4.8  Smokeless Tobacco Never    Current Outpatient Medications on File Prior to Visit  Medication Sig Dispense Refill   acetaminophen (TYLENOL) 500 MG tablet Take 1,000 mg by mouth every 6 (six) hours as needed.     amLODipine (NORVASC) 5 MG tablet Take 1 tablet (5 mg total) by mouth every morning. 90 tablet 3   clopidogrel (PLAVIX) 75 MG tablet TAKE 1 TABLET BY MOUTH EVERY DAY 90 tablet 3   losartan (COZAAR) 50 MG tablet TAKE 1 TABLET BY MOUTH EVERY MORNING 90 tablet 1   Multiple Vitamin (MULTIVITAMIN WITH MINERALS) TABS tablet Take 1 tablet by mouth daily.     potassium chloride (KLOR-CON) 10 MEQ tablet TAKE 1 TABLET BY MOUTH EVERY DAY 30 tablet 5   rosuvastatin (CRESTOR) 40 MG tablet Take 1 tablet (40 mg total) by mouth daily. 90 tablet 2   No current facility-administered medications on file prior to visit.     ROS see history of present illness  Objective  Physical Exam Vitals:   07/01/22 1530  BP: 116/74  Pulse: 81  Temp: 98.3 F (36.8 C)  SpO2: 97%    BP Readings from Last 3 Encounters:  07/01/22 116/74   04/01/22 120/78  01/06/22 122/72   Wt Readings from Last 3 Encounters:  07/01/22 183 lb 3.2 oz (83.1 kg)  04/01/22 186 lb 6.4 oz (84.6 kg)  01/06/22 183 lb (83 kg)    Physical Exam Constitutional:      General: She is not in acute distress.    Appearance: She is not diaphoretic.  Cardiovascular:     Rate and Rhythm: Normal rate and regular rhythm.     Heart sounds: Normal heart sounds.  Pulmonary:     Effort: Pulmonary effort is normal.     Breath sounds: Normal breath sounds.  Skin:    General: Skin is warm and dry.  Neurological:     Mental Status: She is alert.      Assessment/Plan: Please see individual problem list.  Rash Assessment & Plan: Undetermined cause though constellation of symptoms are concerning for tickborne illness.  The rash is not overly convincing for [facility] spotted fever though given the constellation of symptoms we will proceed with treatment for [facility] spotted fever with doxycycline 100 mg twice daily and lab testing.  Advised to seek medical attention over the weekend if she starts to feel worse.  Orders: -     [facility] spotted fvr abs pnl(IgG+IgM) -  Doxycycline Hyclate; Take 1 tablet (100 mg total) by mouth 2 (two) times daily.  Dispense: 28 tablet; Refill: 0 -     CBC with Differential/Platelet -     Basic metabolic panel  History of hypokalemia Assessment & Plan: Check potassium today.  Orders: -     Basic metabolic panel     Return if symptoms worsen or fail to improve.   Marikay Alar, MD [facility]

## 2022-07-01 NOTE — Assessment & Plan Note (Signed)
Check potassium today

## 2022-07-01 NOTE — Patient Instructions (Signed)
Nice to see you. We will get lab work today and contact with results. Please start the doxycycline.  Please monitor for diarrhea with this.  If you have excessive diarrhea please let us know. Your skin may be more sensitive to the sun while you are on the doxycycline so please make sure you cover up if you go outside.

## 2022-07-01 NOTE — Assessment & Plan Note (Addendum)
Undetermined cause though constellation of symptoms are concerning for tickborne illness.  The rash is not overly convincing for [facility] spotted fever though given the constellation of symptoms we will proceed with treatment for [facility] spotted fever with doxycycline 100 mg twice daily and lab testing.  Advised to seek medical attention over the weekend if she starts to feel worse.

## 2022-07-01 NOTE — Telephone Encounter (Signed)
Patient is scheduled for 3:15 today due to the rash on her arms, chest and back and now she has been feeling unwell and has a headache.

## 2022-07-06 ENCOUNTER — Encounter: Payer: Self-pay | Admitting: *Deleted

## 2022-07-06 ENCOUNTER — Ambulatory Visit: Payer: BC Managed Care – PPO | Admitting: Family Medicine

## 2022-07-06 ENCOUNTER — Encounter: Payer: Self-pay | Admitting: Family Medicine

## 2022-07-06 VITALS — BP 124/76 | HR 68 | Temp 98.5°F | Ht 65.0 in | Wt 184.8 lb

## 2022-07-06 DIAGNOSIS — R7303 Prediabetes: Secondary | ICD-10-CM

## 2022-07-06 DIAGNOSIS — I10 Essential (primary) hypertension: Secondary | ICD-10-CM

## 2022-07-06 DIAGNOSIS — R21 Rash and other nonspecific skin eruption: Secondary | ICD-10-CM | POA: Diagnosis not present

## 2022-07-06 DIAGNOSIS — E785 Hyperlipidemia, unspecified: Secondary | ICD-10-CM | POA: Diagnosis not present

## 2022-07-06 DIAGNOSIS — Z87891 Personal history of nicotine dependence: Secondary | ICD-10-CM

## 2022-07-06 LAB — CBC WITH DIFFERENTIAL/PLATELET
Absolute Monocytes: 346 cells/uL (ref 200–950)
Basophils Absolute: 22 cells/uL (ref 0–200)
Basophils Relative: 0.4 %
Eosinophils Absolute: 103 cells/uL (ref 15–500)
Eosinophils Relative: 1.9 %
HCT: 34.4 % — ABNORMAL LOW (ref 35.0–45.0)
Hemoglobin: 11.3 g/dL — ABNORMAL LOW (ref 11.7–15.5)
Lymphs Abs: 2452 cells/uL (ref 850–3900)
MCH: 27.5 pg (ref 27.0–33.0)
MCHC: 32.8 g/dL (ref 32.0–36.0)
MCV: 83.7 fL (ref 80.0–100.0)
MPV: 10.3 fL (ref 7.5–12.5)
Monocytes Relative: 6.4 %
Neutro Abs: 2479 cells/uL (ref 1500–7800)
Neutrophils Relative %: 45.9 %
Platelets: 299 10*3/uL (ref 140–400)
RBC: 4.11 10*6/uL (ref 3.80–5.10)
RDW: 13.8 % (ref 11.0–15.0)
Total Lymphocyte: 45.4 %
WBC: 5.4 10*3/uL (ref 3.8–10.8)

## 2022-07-06 LAB — BASIC METABOLIC PANEL
BUN: 16 mg/dL (ref 7–25)
CO2: 25 mmol/L (ref 20–32)
Calcium: 9.7 mg/dL (ref 8.6–10.4)
Chloride: 106 mmol/L (ref 98–110)
Creat: 0.82 mg/dL (ref 0.50–1.03)
Glucose, Bld: 116 mg/dL — ABNORMAL HIGH (ref 65–99)
Potassium: 3.8 mmol/L (ref 3.5–5.3)
Sodium: 141 mmol/L (ref 135–146)

## 2022-07-06 LAB — HEMOGLOBIN A1C: Hgb A1c MFr Bld: 6.4 % (ref 4.6–6.5)

## 2022-07-06 LAB — LIPID PANEL
Cholesterol: 127 mg/dL (ref 0–200)
HDL: 63.9 mg/dL (ref >39.00)
LDL Cholesterol: 48 mg/dL (ref 0–99)
NonHDL: 63.11
Total CHOL/HDL Ratio: 2
Triglycerides: 76 mg/dL (ref 0.0–149.0)
VLDL: 15.2 mg/dL (ref 0.0–40.0)

## 2022-07-06 LAB — ROCKY MTN SPOTTED FVR ABS PNL(IGG+IGM)
RMSF IgG: NOT DETECTED
RMSF IgM: NOT DETECTED

## 2022-07-06 NOTE — Assessment & Plan Note (Signed)
Improved.  I advised the patient to continue to take the doxycycline until we contact her with her [facility] spotted fever testing results.  Advised to seek medical attention if she feels worse again.  Advised to contact us if she has not heard by early next week.

## 2022-07-06 NOTE — Assessment & Plan Note (Signed)
Chronic issue.  Check A1c. 

## 2022-07-06 NOTE — Assessment & Plan Note (Signed)
Chronic issue.  Check lipid panel.  Continue Crestor 40 mg daily.   

## 2022-07-06 NOTE — Assessment & Plan Note (Signed)
Chronic issue.  Adequately controlled.  She will continue amlodipine 5 mg daily and losartan 50 mg daily.

## 2022-07-06 NOTE — Progress Notes (Signed)
Marikay Alar, MD Phone: [phone]  Alyssa Matthews is a 62 y.o. female who presents today for f/u.  HYPERTENSION Disease Monitoring Home BP Monitoring <130/80, though not checking frequently Chest pain- no    Dyspnea- no Medications Compliance-  taking amlodipine, losartan.  Edema- no BMET    Component Value Date/Time   NA 141 07/01/2022 1532   NA CANCELED 09/25/2018 0851   NA 142 12/11/2013 1054   K 3.8 07/01/2022 1532   K 3.7 12/11/2013 1054   CL 106 07/01/2022 1532   CL 109 (H) 12/11/2013 1054   CO2 25 07/01/2022 1532   CO2 28 12/11/2013 1054   GLUCOSE 116 (H) 07/01/2022 1532   GLUCOSE 96 12/11/2013 1054   BUN 16 07/01/2022 1532   BUN CANCELED 09/25/2018 0851   BUN 9 12/11/2013 1054   CREATININE 0.82 07/01/2022 1532   CALCIUM 9.7 07/01/2022 1532   CALCIUM 8.5 12/11/2013 1054   GFRNONAA >60 06/27/2019 1635   GFRNONAA >60 12/11/2013 1054   GFRAA >60 06/27/2019 1635   GFRAA >60 12/11/2013 1054   HYPERLIPIDEMIA Symptoms Chest pain on exertion:  no  Medications: Compliance- taking crestor Right upper quadrant pain- no  Muscle aches- no Lipid Panel     Component Value Date/Time   CHOL 138 05/10/2021 0805   TRIG 85.0 05/10/2021 0805   HDL 68.10 05/10/2021 0805   CHOLHDL 2 05/10/2021 0805   VLDL 17.0 05/10/2021 0805   LDLCALC 53 05/10/2021 0805   LDLDIRECT 52.0 08/23/2019 0856   Rash/fatigue: Patient she feels significantly better at this time.  Rash has resolved.  She is on doxycycline.  We have not received her [facility] spotted fever testing results.  Social History   Tobacco Use  Smoking Status Former   Packs/day: 0.75   Years: 30.00   Additional pack years: 0.00   Total pack years: 22.50   Types: Cigarettes   Quit date: 08/2017   Years since quitting: 4.9  Smokeless Tobacco Never    Current Outpatient Medications on File Prior to Visit  Medication Sig Dispense Refill   acetaminophen (TYLENOL) 500 MG tablet Take 1,000 mg by mouth every  6 (six) hours as needed.     amLODipine (NORVASC) 5 MG tablet Take 1 tablet (5 mg total) by mouth every morning. 90 tablet 3   clopidogrel (PLAVIX) 75 MG tablet TAKE 1 TABLET BY MOUTH EVERY DAY 90 tablet 3   doxycycline (VIBRA-TABS) 100 MG tablet Take 1 tablet (100 mg total) by mouth 2 (two) times daily. 28 tablet 0   losartan (COZAAR) 50 MG tablet TAKE 1 TABLET BY MOUTH EVERY MORNING 90 tablet 1   Multiple Vitamin (MULTIVITAMIN WITH MINERALS) TABS tablet Take 1 tablet by mouth daily.     rosuvastatin (CRESTOR) 40 MG tablet Take 1 tablet (40 mg total) by mouth daily. 90 tablet 2   No current facility-administered medications on file prior to visit.     ROS see history of present illness  Objective  Physical Exam Vitals:   07/06/22 0932  BP: 124/76  Pulse: 68  Temp: 98.5 F (36.9 C)  SpO2: 99%    BP Readings from Last 3 Encounters:  07/06/22 124/76  07/01/22 116/74  04/01/22 120/78   Wt Readings from Last 3 Encounters:  07/06/22 184 lb 12.8 oz (83.8 kg)  07/01/22 183 lb 3.2 oz (83.1 kg)  04/01/22 186 lb 6.4 oz (84.6 kg)    Physical Exam Constitutional:      General: She is not  in acute distress.    Appearance: She is not diaphoretic.  Cardiovascular:     Rate and Rhythm: Normal rate and regular rhythm.     Heart sounds: Normal heart sounds.  Pulmonary:     Effort: Pulmonary effort is normal.     Breath sounds: Normal breath sounds.  Musculoskeletal:     Right lower leg: No edema.     Left lower leg: No edema.  Skin:    General: Skin is warm and dry.  Neurological:     Mental Status: She is alert.      Assessment/Plan: Please see individual problem list.  Essential hypertension Assessment & Plan: Chronic issue.  Adequately controlled.  She will continue amlodipine 5 mg daily and losartan 50 mg daily.   Prediabetes Assessment & Plan: Chronic issue.  Check A1c.  Orders: -     Hemoglobin A1c  Hyperlipidemia, unspecified hyperlipidemia  type Assessment & Plan: Chronic issue.  Check lipid panel.  Continue Crestor 40 mg daily.  Orders: -     Lipid panel  Rash Assessment & Plan: Improved.  I advised the patient to continue to take the doxycycline until we contact her with her [facility] spotted fever testing results.  Advised to seek medical attention if she feels worse again.  Advised to contact us if she has not heard by early next week.   Former smoker -     Ambulatory Referral for Lung Cancer Scre     Health Maintenance: lung cancer screening referral placed. Patient smoked about a PPD for 30ish years.   Return in about 6 months (around 01/05/2023).   Marikay Alar, MD [facility]

## 2022-07-13 ENCOUNTER — Other Ambulatory Visit: Payer: Self-pay | Admitting: Physician Assistant

## 2022-09-20 ENCOUNTER — Encounter: Payer: Self-pay | Admitting: Family Medicine

## 2022-10-24 ENCOUNTER — Other Ambulatory Visit: Payer: Self-pay | Admitting: Family Medicine

## 2022-10-24 DIAGNOSIS — Z1231 Encounter for screening mammogram for malignant neoplasm of breast: Secondary | ICD-10-CM

## 2022-11-01 ENCOUNTER — Telehealth: Payer: Self-pay

## 2022-11-01 ENCOUNTER — Encounter: Payer: Self-pay | Admitting: Family Medicine

## 2022-11-01 ENCOUNTER — Telehealth: Payer: BC Managed Care – PPO | Admitting: Family Medicine

## 2022-11-01 VITALS — Ht 65.0 in | Wt 183.0 lb

## 2022-11-01 DIAGNOSIS — R197 Diarrhea, unspecified: Secondary | ICD-10-CM | POA: Diagnosis not present

## 2022-11-01 DIAGNOSIS — A084 Viral intestinal infection, unspecified: Secondary | ICD-10-CM

## 2022-11-01 DIAGNOSIS — Z8639 Personal history of other endocrine, nutritional and metabolic disease: Secondary | ICD-10-CM | POA: Diagnosis not present

## 2022-11-01 DIAGNOSIS — E785 Hyperlipidemia, unspecified: Secondary | ICD-10-CM

## 2022-11-01 NOTE — Progress Notes (Signed)
Virtual Visit via Video note  I connected with Alyssa Matthews on 11/15/22 at 0827 by video and verified that I am speaking with the correct person using two identifiers. Alyssa Matthews is currently located at home and  is currently with alone during visit. The provider, Dana Allan, MD is located in their office at time of visit.  I discussed the limitations, risks, security and privacy concerns of performing an evaluation and management service by video and the availability of in person appointments. I also discussed with the patient that there may be a patient responsible charge related to this service. The patient expressed understanding and agreed to proceed.  Subjective: PCP: Glori Luis, MD  Chief Complaint  Patient presents with   Diarrhea    Since Saturday 3 Saturday, Sunday 3 and Monday 6 in the am it was like water. Denies any pain in the stomach.    HPI Diarrhea: Patient complains of diarrhea. Onset of diarrhea was 3 days ago. Diarrhea is occurring approximately several times per day. Patient describes diarrhea as watery. Diarrhea has been associated with  nausea . Endorses first 2 days had loose stool,  1 day of watery diarrhea every 1-2 hours.  Has not had any diarrhea today.  Reports having eaten [place] sub from take out place  Patient denies blood in stool, fever, illness in household contacts, recent antibiotic use, recent travel, significant abdominal pain, unintentional weight loss, recent sick contacts.  Previous visits for diarrhea: none. Evaluation to date: none. Treatment to date: Pepto Bismol and Immodium Re  ROS: Per HPI  Current Outpatient Medications:    acetaminophen (TYLENOL) 500 MG tablet, Take 1,000 mg by mouth every 6 (six) hours as needed., Disp: , Rfl:    amLODipine (NORVASC) 5 MG tablet, Take 1 tablet (5 mg total) by mouth every morning., Disp: 90 tablet, Rfl: 3   clopidogrel (PLAVIX) 75 MG tablet, TAKE 1 TABLET BY MOUTH EVERY DAY, Disp: 90  tablet, Rfl: 3   losartan (COZAAR) 50 MG tablet, TAKE 1 TABLET BY MOUTH EVERY MORNING, Disp: 90 tablet, Rfl: 1   Multiple Vitamin (MULTIVITAMIN WITH MINERALS) TABS tablet, Take 1 tablet by mouth daily., Disp: , Rfl:    rosuvastatin (CRESTOR) 40 MG tablet, Take 1 tablet (40 mg total) by mouth daily., Disp: 90 tablet, Rfl: 2  Observations/Objective: Physical Exam Pulmonary:     Effort: Pulmonary effort is normal.  Neurological:     Mental Status: She is alert and oriented to person, place, and time. Mental status is at baseline.  Psychiatric:        Mood and Affect: Mood normal.        Behavior: Behavior normal.        Thought Content: Thought content normal.        Judgment: Judgment normal.    Assessment and Plan: History of hypokalemia -     Basic metabolic panel; Future  Hyperlipidemia, unspecified hyperlipidemia type -     CBC; Future  Diarrhea, unspecified type Assessment & Plan: Suspect viral etiology given symptoms have improved.  Possible food contamination given symptoms started after eating sub from restaurant Have requested that patient come in this afternoon for blood work and stool samples Continue to hydrate Follow up with PCP if no improvement  Orders: -     Cdiff NAA+O+P+Stool Culture; Future  Viral gastroenteritis Assessment & Plan: Suspect viral etiology given symptoms have improved.  Possible food contamination given symptoms started after eating sub from restaurant  Have requested that patient come in this afternoon for blood work and stool samples Continue to hydrate Follow up with PCP if no improvement     Follow Up Instructions: No follow-ups on file.   I discussed the assessment and treatment plan with the patient. The patient was provided an opportunity to ask questions and all were answered. The patient agreed with the plan and demonstrated an understanding of the instructions.   The patient was advised to call back or seek an in-person  evaluation if the symptoms worsen or if the condition fails to improve as anticipated.  The above assessment and management plan was discussed with the patient. The patient verbalized understanding of and has agreed to the management plan. Patient is aware to call the clinic if symptoms persist or worsen. Patient is aware when to return to the clinic for a follow-up visit. Patient educated on when it is appropriate to go to the emergency department.    Dana Allan, MD

## 2022-11-01 NOTE — Telephone Encounter (Signed)
Called pt to schedule a lab appointment.

## 2022-11-02 ENCOUNTER — Telehealth: Payer: Self-pay | Admitting: Family Medicine

## 2022-11-02 NOTE — Telephone Encounter (Signed)
Patient need lab orders.

## 2022-11-02 NOTE — Telephone Encounter (Signed)
It appears that the labs that need to be ordered may be related to the visit you completed with her.  Please review and see what needs to be ordered.  Thanks.

## 2022-11-03 ENCOUNTER — Other Ambulatory Visit (INDEPENDENT_AMBULATORY_CARE_PROVIDER_SITE_OTHER): Payer: BC Managed Care – PPO

## 2022-11-03 DIAGNOSIS — E785 Hyperlipidemia, unspecified: Secondary | ICD-10-CM

## 2022-11-03 DIAGNOSIS — Z8639 Personal history of other endocrine, nutritional and metabolic disease: Secondary | ICD-10-CM

## 2022-11-03 DIAGNOSIS — R197 Diarrhea, unspecified: Secondary | ICD-10-CM

## 2022-11-03 LAB — BASIC METABOLIC PANEL
BUN: 10 mg/dL (ref 6–23)
CO2: 28 mEq/L (ref 19–32)
Calcium: 9 mg/dL (ref 8.4–10.5)
Chloride: 104 mEq/L (ref 96–112)
Creatinine, Ser: 0.74 mg/dL (ref 0.40–1.20)
GFR: 88.15 mL/min (ref >60.00)
Glucose, Bld: 101 mg/dL — ABNORMAL HIGH (ref 70–99)
Potassium: 3.6 mEq/L (ref 3.5–5.1)
Sodium: 139 mEq/L (ref 135–145)

## 2022-11-03 LAB — CBC
HCT: 34.9 % — ABNORMAL LOW (ref 36.0–46.0)
Hemoglobin: 11.2 g/dL — ABNORMAL LOW (ref 12.0–15.0)
MCHC: 32.1 g/dL (ref 30.0–36.0)
MCV: 84.6 fl (ref 78.0–100.0)
Platelets: 262 10*3/uL (ref 150.0–400.0)
RBC: 4.13 Mil/uL (ref 3.87–5.11)
RDW: 15.1 % (ref 11.5–15.5)
WBC: 5.1 10*3/uL (ref 4.0–10.5)

## 2022-11-15 ENCOUNTER — Encounter: Payer: Self-pay | Admitting: Family Medicine

## 2022-11-15 DIAGNOSIS — R197 Diarrhea, unspecified: Secondary | ICD-10-CM | POA: Insufficient documentation

## 2022-11-15 NOTE — Assessment & Plan Note (Signed)
Suspect viral etiology given symptoms have improved.  Possible food contamination given symptoms started after eating sub from restaurant Have requested that patient come in this afternoon for blood work and stool samples Continue to hydrate Follow up with PCP if no improvement

## 2022-11-15 NOTE — Patient Instructions (Signed)
It was a pleasure meeting you today. Thank you for allowing me to take part in your health care.  Our goals for today as we discussed include:  Come to clinic for blood work and stool samples Increase fluids  If continues to worsen follow up with PCP   If you have any questions or concerns, please do not hesitate to call the office at [phone].  I look forward to our next visit and until then take care and stay safe.  Regards,   Dana Allan, MD   [facility]

## 2022-11-20 ENCOUNTER — Other Ambulatory Visit: Payer: Self-pay | Admitting: Family Medicine

## 2022-11-20 DIAGNOSIS — I10 Essential (primary) hypertension: Secondary | ICD-10-CM

## 2022-11-21 ENCOUNTER — Other Ambulatory Visit: Payer: Self-pay | Admitting: Family Medicine

## 2022-11-21 ENCOUNTER — Other Ambulatory Visit: Payer: Self-pay | Admitting: Physician Assistant

## 2022-11-21 DIAGNOSIS — E785 Hyperlipidemia, unspecified: Secondary | ICD-10-CM

## 2022-11-22 ENCOUNTER — Telehealth: Payer: Self-pay

## 2022-11-22 DIAGNOSIS — E785 Hyperlipidemia, unspecified: Secondary | ICD-10-CM

## 2022-11-22 DIAGNOSIS — G459 Transient cerebral ischemic attack, unspecified: Secondary | ICD-10-CM

## 2022-11-22 MED ORDER — CLOPIDOGREL BISULFATE 75 MG PO TABS
75.0000 mg | ORAL_TABLET | Freq: Every day | ORAL | 3 refills | Status: DC
Start: 2022-11-22 — End: 2023-09-18

## 2022-11-22 NOTE — Telephone Encounter (Signed)
Prescription Request  11/22/2022  LOV: Visit date not found  What is the name of the medication or equipment? clopidogrel (PLAVIX) 75 MG tablet  Have you contacted your pharmacy to request a refill? Yes   Which pharmacy would you like this sent to?  [id number] [address] [address], [place] - [address] [place] [address] Phone: [phone] Fax: [phone]    Patient notified that their request is being sent to the clinical staff for review and that they should receive a response within 2 business days.   Please advise at Mobile [phone] (mobile)  Patient states she has one pill left which she will take tomorrow.

## 2022-11-22 NOTE — Telephone Encounter (Signed)
Sent to pharmacy 

## 2022-11-30 DIAGNOSIS — I10 Essential (primary) hypertension: Secondary | ICD-10-CM | POA: Diagnosis not present

## 2022-11-30 DIAGNOSIS — Z8673 Personal history of transient ischemic attack (TIA), and cerebral infarction without residual deficits: Secondary | ICD-10-CM | POA: Diagnosis not present

## 2022-11-30 DIAGNOSIS — R200 Anesthesia of skin: Secondary | ICD-10-CM | POA: Diagnosis not present

## 2022-11-30 DIAGNOSIS — R29898 Other symptoms and signs involving the musculoskeletal system: Secondary | ICD-10-CM | POA: Diagnosis not present

## 2022-12-06 ENCOUNTER — Other Ambulatory Visit: Payer: Self-pay | Admitting: *Deleted

## 2022-12-06 DIAGNOSIS — Z122 Encounter for screening for malignant neoplasm of respiratory organs: Secondary | ICD-10-CM

## 2022-12-06 DIAGNOSIS — Z87891 Personal history of nicotine dependence: Secondary | ICD-10-CM

## 2022-12-14 ENCOUNTER — Telehealth: Payer: Self-pay | Admitting: Acute Care

## 2022-12-14 ENCOUNTER — Ambulatory Visit (INDEPENDENT_AMBULATORY_CARE_PROVIDER_SITE_OTHER): Payer: BC Managed Care – PPO | Admitting: Adult Health

## 2022-12-14 ENCOUNTER — Ambulatory Visit: Payer: BC Managed Care – PPO | Attending: Nurse Practitioner | Admitting: Nurse Practitioner

## 2022-12-14 ENCOUNTER — Encounter: Payer: Self-pay | Admitting: Nurse Practitioner

## 2022-12-14 ENCOUNTER — Encounter: Payer: Self-pay | Admitting: Adult Health

## 2022-12-14 VITALS — BP 128/76 | HR 61 | Ht 65.0 in | Wt 180.2 lb

## 2022-12-14 DIAGNOSIS — Z87891 Personal history of nicotine dependence: Secondary | ICD-10-CM

## 2022-12-14 DIAGNOSIS — Q2112 Patent foramen ovale: Secondary | ICD-10-CM | POA: Diagnosis not present

## 2022-12-14 DIAGNOSIS — I10 Essential (primary) hypertension: Secondary | ICD-10-CM | POA: Diagnosis not present

## 2022-12-14 DIAGNOSIS — E782 Mixed hyperlipidemia: Secondary | ICD-10-CM | POA: Diagnosis not present

## 2022-12-14 NOTE — Progress Notes (Signed)
  Virtual Visit via Telephone Note  I connected with SHANTELLE ALLES, 12/14/22 10:32 AM by a telemedicine application and verified that I am speaking with the correct person using two identifiers.  Location: Patient: home Provider: home   I discussed the limitations of evaluation and management by telemedicine and the availability of in person appointments. The patient expressed understanding and agreed to proceed.   Shared Decision Making Visit Lung Cancer Screening Program [phone])   Eligibility: 62 y.o. Pack Years Smoking History Calculation =30 (# packs/per year x # years smoked) Recent History of coughing up blood  no Unexplained weight loss? no ( >Than 15 pounds within the last 6 months ) Prior History Lung / other cancer no (Diagnosis within the last 5 years already requiring surveillance chest CT Scans). Smoking Status Former Smoker Former Smokers: Years since quit: 8 years  Quit Date: 2016  Visit Components: Discussion included one or more decision making aids. YES Discussion included risk/benefits of screening. YES Discussion included potential follow up diagnostic testing for abnormal scans. YES Discussion included meaning and risk of over diagnosis. YES Discussion included meaning and risk of False Positives. YES Discussion included meaning of total radiation exposure. YES  Counseling Included: Importance of adherence to annual lung cancer LDCT screening. YES Impact of comorbidities on ability to participate in the program. YES Ability and willingness to under diagnostic treatment. YES  Smoking Cessation Counseling: Former Smokers:  Discussed the importance of maintaining cigarette abstinence. yes Diagnosis Code: Personal History of Nicotine Dependence. U04.540 Information about tobacco cessation classes and interventions provided to patient. Yes Patient provided with "ticket" for LDCT Scan. yes Written Order for Lung Cancer Screening with LDCT placed in  Epic. Yes (CT Chest Lung Cancer Screening Low Dose W/O CM) [id number]  Z12.2-Screening of respiratory organs Z87.891-Personal history of nicotine dependence   Danford Bad 12/14/22

## 2022-12-14 NOTE — Patient Instructions (Signed)

## 2022-12-14 NOTE — Progress Notes (Signed)
Office Visit    Patient Name: Alyssa Matthews Date of Encounter: 12/14/2022  Primary Care Provider:  Glori Luis, MD Primary Cardiologist:  Julien Nordmann, MD  Chief Complaint    62 y.o. female with a history of TIA/CVA, PFO, hypertension, hyperlipidemia, hyperthyroidism status post radioactive iodine, anemia, and prior tobacco abuse quitting in 2009, presents for follow-up related to hypertension and PFO.   Past Medical History    Past Medical History:  Diagnosis Date   Abdominal pain, left lower quadrant 06/29/2015   Anemia    Arthritis    right knee   Benign neoplasm of sigmoid colon    Bilateral numbness of feet 06/29/2015   Chronic left shoulder pain 05/17/2017   Decreased sex drive 09/81/1914   Dyspnea on exertion 04/18/2016   Elevated LDL cholesterol level 06/29/2015   History of blood transfusion    History of tobacco abuse 05/18/2015   Hypercholesteremia    Hypertension    Lacunar infarction (HCC)    a. MRI brain 6/19: remote lacunar infarcts involving the pons   Lipoma 04/16/2015   Nodule of left lobe of thyroid gland    a.) NM thyroid uptake scan 01/23/2017: large toxic autonomous nodule; I-123 uptake -- 6hr 8.4%, 24hr 9.5%. b.) Carotid doppler 08/31/2017: 2.2 cm LEFT thyroid nodule. c.) CTA neck 12/22/2017: 27 x 15 x 23 mm LEFT thyroid nodule extending to isthmus. d.) Tx'd with oral RAI (I-131) on 03/24/2017.   PFO (patent foramen ovale)    a. TTE 6/19: EF 60-65%, no RWMA, Gr1DD, mild MR, RVSF normal, patent foramen ovale was noted with a positive saline contrast study, PASP normal; b. 09/2018 Echo: EF 55-60%, impaired relaxation. No rwma. Nl RV fxn. PFO. Triv MR; c. 11/2019 Echo: EF 55-60%, no rwma, nl RV fxn. No atrial level shunt detected by doppler.   Prediabetes 05/18/2015   Rib contusion, left, initial encounter 06/28/2016   Sebaceous cyst of labia 12/05/2016   Shoulder pain, bilateral 01/15/2015   Statin intolerance 05/18/2015   Subclinical  hyperthyroidism 06/28/2016   TIA (transient ischemic attack)    x3-last one in 2019   Vulvar lesion 10/31/2016   Wears dentures    partial upper   Weight loss 10/31/2016   Past Surgical History:  Procedure Laterality Date   BREAST CYST ASPIRATION  2015   COLONOSCOPY WITH PROPOFOL N/A 03/30/2015   Procedure: COLONOSCOPY WITH PROPOFOL;  Surgeon: Midge Minium, MD;  Location: [facility];  Service: Endoscopy;  Laterality: N/A;   DILATION AND CURETTAGE OF UTERUS     KNEE ARTHROSCOPY Right 01/06/2021   Procedure: KNEE ARTHROSCOPY WITH DEBRIDEMENT AND MENISCAL REPAIR;  Surgeon: Christena Flake, MD;  Location: [facility];  Service: Orthopedics;  Laterality: Right;   POLYPECTOMY  03/30/2015   Procedure: POLYPECTOMY;  Surgeon: Midge Minium, MD;  Location: [facility];  Service: Endoscopy;;    Allergies  No Known Allergies  History of Present Illness      62 y.o. y/o female with above past medical history including TIA in June 2019 with patient reported TIA/CVA approximately 4 years prior to that, with MRI in 2020 showing prior lacunar infarcts. She also has a history of PFO, hypertension, hyperlipidemia, hyperthyroidism status post radioactive iodine, anemia, mild mitral regurgitation, and tobacco abuse-quitting in 2019. In June 2019, she was admitted with left-sided facial numbness and left lower extremity numbness, that subsequently resolved. CT of the head was negative. MRI of the brain showed remote lacunar infarct involving the pons.  Carotid ultrasound did not show any significant stenosis. Echocardiogram showed an EF of 60-65% without regional wall motion abnormalities, grade 1 diastolic dysfunction, mild MR, and a PFO with positive saline contrast study. PASP was normal. She was seen by neurology and placed on aspirin and this was subsequently switched to Plavix as an outpatient. PFO was reviewed by structural heart team later in 2019 and they felt that the PFO was small and given  multiple uncontrolled risk factors for stroke, conservative therapy was recommended.  Most recent echo in September 2021 showed normal LV function without presence of atrial level shunt by Doppler.   Alyssa Matthews was last seen in cardiology clinic in August 2023, at which time she was doing well.  Over the past year, she has continued to do well.  She works second shift and has been taking care of her 10 year old mother-in-law in the mornings.  As result, she is not walking for exercise as frequently as she used to.  She denies chest pain, dyspnea, palpitations, PND, orthopnea, dizziness, syncope, edema, or early satiety.  Home Medications    Current Outpatient Medications  Medication Sig Dispense Refill   acetaminophen (TYLENOL) 500 MG tablet Take 1,000 mg by mouth every 6 (six) hours as needed.     amLODipine (NORVASC) 5 MG tablet Take 1 tablet (5 mg total) by mouth every morning. 90 tablet 3   clopidogrel (PLAVIX) 75 MG tablet Take 1 tablet (75 mg total) by mouth daily. 90 tablet 3   losartan (COZAAR) 50 MG tablet TAKE 1 TABLET BY MOUTH EVERY DAY IN THE MORNING 90 tablet 1   Multiple Vitamin (MULTIVITAMIN WITH MINERALS) TABS tablet Take 1 tablet by mouth daily.     rosuvastatin (CRESTOR) 40 MG tablet TAKE 1 TABLET BY MOUTH EVERY DAY 90 tablet 2   No current facility-administered medications for this visit.     Review of Systems    She denies chest pain, palpitations, dyspnea, pnd, orthopnea, n, v, dizziness, syncope, edema, weight gain, or early satiety.  All other systems reviewed and are otherwise negative except as noted above.    Physical Exam    VS:  BP 128/76 (BP Location: Left Arm, Patient Position: Sitting, Cuff Size: Normal)   Pulse 61   Ht 5\' 5"  (1.651 m)   Wt 180 lb 3.2 oz (81.7 kg)   SpO2 97%   BMI 29.99 kg/m  , BMI Body mass index is 29.99 kg/m.     GEN: Well nourished, well developed, in no acute distress. HEENT: normal. Neck: Supple, no JVD, carotid bruits, or  masses. Cardiac: RRR, no murmurs, rubs, or gallops. No clubbing, cyanosis, edema.  Radials 2+/PT 2+ and equal bilaterally.  Respiratory:  Respirations regular and unlabored, clear to auscultation bilaterally. GI: Soft, nontender, nondistended, BS + x 4. MS: no deformity or atrophy. Skin: warm and dry, no rash. Neuro:  Strength and sensation are intact. Psych: Normal affect.  Accessory Clinical Findings    ECG personally reviewed by me today - EKG Interpretation Date/Time:  Wednesday December 14 2022 11:17:36 EDT Ventricular Rate:  61 PR Interval:  142 QRS Duration:  98 QT Interval:  402 QTC Calculation: 404 R Axis:   17  Text Interpretation: Normal sinus rhythm Nonspecific ST and T wave abnormality When compared with ECG of 27-Jun-2019 16:41, Nonspecific T wave abnormality has replaced inverted T waves in Anterior leads Confirmed by Nicolasa Ducking [phone]) on 12/14/2022 11:44:50 AM  - no acute changes.  Lab Results  Component  Value Date   WBC 5.1 11/03/2022   HGB 11.2 (L) 11/03/2022   HCT 34.9 (L) 11/03/2022   MCV 84.6 11/03/2022   PLT 262.0 11/03/2022   Lab Results  Component Value Date   CREATININE 0.74 11/03/2022   BUN 10 11/03/2022   NA 139 11/03/2022   K 3.6 11/03/2022   CL 104 11/03/2022   CO2 28 11/03/2022   Lab Results  Component Value Date   ALT 14 09/29/2021   AST 16 09/29/2021   ALKPHOS 83 09/29/2021   BILITOT 0.3 09/29/2021   Lab Results  Component Value Date   CHOL 127 07/06/2022   HDL 63.90 07/06/2022   LDLCALC 48 07/06/2022   LDLDIRECT 52.0 08/23/2019   TRIG 76.0 07/06/2022   CHOLHDL 2 07/06/2022    Lab Results  Component Value Date   HGBA1C 6.4 07/06/2022    Assessment & Plan    1.  Primary hypertension: Pressure well-controlled on amlodipine and ARB therapy.  Normal renal function electrolytes in August.  2.  PFO: Noted on echo 2019 following TIA.  Evaluated by structural heart team at that time and PFO was felt to be small and  unlikely to have contributed to prior TIA.  She has been managed conservatively.  Most recent echo in September 2021 showed normal LV function without evidence of atrial level shunt on Doppler imaging.  She has done well over the past year without neurologic symptoms, chest pain, or dyspnea.  She remains on clopidogrel therapy.  3.  History of TIA/lacunar infarcts: Managed by neurology-remains on clopidogrel.  4.  Hyperlipidemia: LDL of 48 in April.  Remains on rosuvastatin therapy.  5.  Disposition: Follow-up in 1 year or sooner if necessary.  Nicolasa Ducking, NP 12/14/2022, 12:04 PM

## 2022-12-14 NOTE — Patient Instructions (Signed)
Medication Instructions:  No changes *If you need a refill on your cardiac medications before your next appointment, please call your pharmacy*   Lab Work: None ordered If you have labs (blood work) drawn today and your tests are completely normal, you will receive your results only by: MyChart Message (if you have MyChart) OR A paper copy in the mail If you have any lab test that is abnormal or we need to change your treatment, we will call you to review the results.   Testing/Procedures: None ordered   Follow-Up: At [facility], you and your health needs are our priority.  As part of our continuing mission to provide you with exceptional heart care, we have created designated Provider Care Teams.  These Care Teams include your primary Cardiologist (physician) and Advanced Practice Providers (APPs -  Physician Assistants and Nurse Practitioners) who all work together to provide you with the care you need, when you need it.  We recommend signing up for the patient portal called "MyChart".  Sign up information is provided on this After Visit Summary.  MyChart is used to connect with patients for Virtual Visits (Telemedicine).  Patients are able to view lab/test results, encounter notes, upcoming appointments, etc.  Non-urgent messages can be sent to your provider as well.   To learn more about what you can do with MyChart, go to [email or web address].    Your next appointment:   12 month(s)  Provider:   You may see Julien Nordmann, MD or one of the following Advanced Practice Providers on your designated Care Team:   Nicolasa Ducking, NP

## 2022-12-14 NOTE — Telephone Encounter (Signed)
Returned call from VM inquiring about $244 out-of-pocket expense for LDCT.  Patient states she is not able to pay this amount but would like to change the appointment to December 2024, as she believes she will have met her out of pocket by that time.  Appt for LDCT moved to 02/21/23 and confirmed with patient

## 2022-12-15 ENCOUNTER — Ambulatory Visit: Payer: BC Managed Care – PPO

## 2022-12-29 ENCOUNTER — Ambulatory Visit
Admission: RE | Admit: 2022-12-29 | Discharge: 2022-12-29 | Disposition: A | Discharge status: Home / Self Care | Payer: BC Managed Care – PPO | Source: Ambulatory Visit | Attending: Family Medicine | Admitting: Family Medicine

## 2022-12-29 DIAGNOSIS — Z1231 Encounter for screening mammogram for malignant neoplasm of breast: Secondary | ICD-10-CM | POA: Diagnosis not present

## 2023-01-06 ENCOUNTER — Ambulatory Visit: Payer: BC Managed Care – PPO | Admitting: Family Medicine

## 2023-01-09 ENCOUNTER — Ambulatory Visit: Payer: BC Managed Care – PPO | Admitting: Family Medicine

## 2023-01-09 ENCOUNTER — Encounter: Payer: Self-pay | Admitting: Family Medicine

## 2023-01-09 VITALS — BP 116/74 | HR 66 | Temp 98.4°F | Ht 65.0 in | Wt 184.0 lb

## 2023-01-09 DIAGNOSIS — Z8673 Personal history of transient ischemic attack (TIA), and cerebral infarction without residual deficits: Secondary | ICD-10-CM

## 2023-01-09 DIAGNOSIS — I10 Essential (primary) hypertension: Secondary | ICD-10-CM | POA: Diagnosis not present

## 2023-01-09 DIAGNOSIS — E785 Hyperlipidemia, unspecified: Secondary | ICD-10-CM

## 2023-01-09 DIAGNOSIS — G459 Transient cerebral ischemic attack, unspecified: Secondary | ICD-10-CM

## 2023-01-09 DIAGNOSIS — R7303 Prediabetes: Secondary | ICD-10-CM | POA: Diagnosis not present

## 2023-01-09 DIAGNOSIS — F439 Reaction to severe stress, unspecified: Secondary | ICD-10-CM | POA: Insufficient documentation

## 2023-01-09 LAB — COMPREHENSIVE METABOLIC PANEL
ALT: 16 U/L (ref 0–35)
AST: 17 U/L (ref 0–37)
Albumin: 4.2 g/dL (ref 3.5–5.2)
Alkaline Phosphatase: 82 U/L (ref 39–117)
BUN: 10 mg/dL (ref 6–23)
CO2: 27 meq/L (ref 19–32)
Calcium: 9.2 mg/dL (ref 8.4–10.5)
Chloride: 106 meq/L (ref 96–112)
Creatinine, Ser: 0.69 mg/dL (ref 0.40–1.20)
GFR: 94.43 mL/min (ref >60.00)
Glucose, Bld: 99 mg/dL (ref 70–99)
Potassium: 3.5 meq/L (ref 3.5–5.1)
Sodium: 139 meq/L (ref 135–145)
Total Bilirubin: 0.4 mg/dL (ref 0.2–1.2)
Total Protein: 7.1 g/dL (ref 6.0–8.3)

## 2023-01-09 LAB — LDL CHOLESTEROL, DIRECT: Direct LDL: 50 mg/dL

## 2023-01-09 LAB — HEMOGLOBIN A1C: Hgb A1c MFr Bld: 6.4 % (ref 4.6–6.5)

## 2023-01-09 NOTE — Progress Notes (Signed)
Alyssa Alar, MD Phone: [phone]  Alyssa Matthews is a 62 y.o. female who presents today for follow-up.  Hypertension: Not checking blood pressures at home.  She is taking amlodipine and losartan.  No chest pain, shortness of breath, or edema.  Hyperlipidemia: Well-controlled on last check.  Taking Crestor.  History of TIA: Patient reports a week or so ago she had some numbness in her left lower leg.  She felt overwhelmed emotionally and talked to her mother about this and she advised her to calm down and the symptoms resolved with that.  She notes she does have chronic issues with intermittent weakness and numbness in the leg though this seems to have been worse than previous episodes.  She notes no Woodhams elsewhere in her body.  Stress: Patient is providing care for her mother-in-law who has dementia.  This is quite stressful.  She notes no depression or anxiety.  She is also working on top of being a care giver.  Social History   Tobacco Use  Smoking Status Former   Current packs/day: 0.00   Average packs/day: 0.8 packs/day for 30.0 years (22.5 ttl pk-yrs)   Types: Cigarettes   Start date: 08/1987   Quit date: 08/2017   Years since quitting: 5.4  Smokeless Tobacco Never    Current Outpatient Medications on File Prior to Visit  Medication Sig Dispense Refill   acetaminophen (TYLENOL) 500 MG tablet Take 1,000 mg by mouth every 6 (six) hours as needed.     amLODipine (NORVASC) 5 MG tablet Take 1 tablet (5 mg total) by mouth every morning. 90 tablet 3   clopidogrel (PLAVIX) 75 MG tablet Take 1 tablet (75 mg total) by mouth daily. 90 tablet 3   losartan (COZAAR) 50 MG tablet TAKE 1 TABLET BY MOUTH EVERY DAY IN THE MORNING 90 tablet 1   Multiple Vitamin (MULTIVITAMIN WITH MINERALS) TABS tablet Take 1 tablet by mouth daily.     rosuvastatin (CRESTOR) 40 MG tablet TAKE 1 TABLET BY MOUTH EVERY DAY 90 tablet 2   No current facility-administered medications on file prior to  visit.     ROS see history of present illness  Objective  Physical Exam Vitals:   01/09/23 1005  BP: 116/74  Pulse: 66  Temp: 98.4 F (36.9 C)  SpO2: 98%    BP Readings from Last 3 Encounters:  01/09/23 116/74  12/14/22 128/76  07/06/22 124/76   Wt Readings from Last 3 Encounters:  01/09/23 184 lb (83.5 kg)  12/14/22 180 lb 3.2 oz (81.7 kg)  11/01/22 183 lb (83 kg)    Physical Exam Constitutional:      General: She is not in acute distress.    Appearance: She is not diaphoretic.  Cardiovascular:     Rate and Rhythm: Normal rate and regular rhythm.     Heart sounds: Normal heart sounds.  Pulmonary:     Effort: Pulmonary effort is normal.     Breath sounds: Normal breath sounds.  Skin:    General: Skin is warm and dry.  Neurological:     Mental Status: She is alert.     Comments: CN 3-12 intact, 5/5 strength in bilateral biceps, triceps, grip, quads, hamstrings, plantar and dorsiflexion, sensation to light touch intact in bilateral UE and LE, normal gait      Assessment/Plan: Please see individual problem list.  Essential hypertension Assessment & Plan: Chronic issue.  Adequately controlled.  Patient will continue amlodipine 5 mg daily and losartan 50 mg daily.  Orders: -     LDL cholesterol, direct -     Comprehensive metabolic panel  History of TIA (transient ischemic attack) Assessment & Plan: Chronic issue.  Concerned that the episode she had recently could have been another TIA versus being a slight exacerbation of her chronic recurrent symptoms from prior TIAs versus strokes.  She will continue Plavix 75 mg daily.  We will get an MRI brain to evaluate further.  If she has recurrent or persistent symptoms she will seek medical attention in the emergency department.  Will also continue risk factor management.  Orders: -     LDL cholesterol, direct -     Comprehensive metabolic panel  Prediabetes -     Hemoglobin A1c  TIA (transient ischemic  attack) -     MR BRAIN WO CONTRAST; Future  Hyperlipidemia, unspecified hyperlipidemia type Assessment & Plan: Chronic issue.  Checking labs.  Patient will continue Crestor 40 mg daily.   Stress Assessment & Plan: Offered support.  Patient will monitor.  She will try to do fallen things for herself.     Return in about 6 months (around 07/10/2023) for transfer of care.   Alyssa Alar, MD [facility]

## 2023-01-09 NOTE — Assessment & Plan Note (Signed)
Offered support.  Patient will monitor.  She will try to do fallen things for herself.

## 2023-01-09 NOTE — Assessment & Plan Note (Signed)
Chronic issue.  Adequately controlled.  Patient will continue amlodipine 5 mg daily and losartan 50 mg daily.

## 2023-01-09 NOTE — Progress Notes (Signed)
Chronic. 

## 2023-01-09 NOTE — Patient Instructions (Signed)
Nice to see you. If you have any recurrent TIA symptoms or persistent numbness, weakness, or new symptoms please seek medical attention immediately. We will contact you with your lab results.

## 2023-01-09 NOTE — Assessment & Plan Note (Signed)
Chronic issue.  Checking labs.  Patient will continue Crestor 40 mg daily.

## 2023-01-09 NOTE — Assessment & Plan Note (Signed)
Chronic issue.  Concerned that the episode she had recently could have been another TIA versus being a slight exacerbation of her chronic recurrent symptoms from prior TIAs versus strokes.  She will continue Plavix 75 mg daily.  We will get an MRI brain to evaluate further.  If she has recurrent or persistent symptoms she will seek medical attention in the emergency department.  Will also continue risk factor management.

## 2023-01-10 ENCOUNTER — Ambulatory Visit
Admission: RE | Admit: 2023-01-10 | Discharge: 2023-01-10 | Disposition: A | Discharge status: Home / Self Care | Payer: BC Managed Care – PPO | Source: Ambulatory Visit | Attending: Family Medicine | Admitting: Family Medicine

## 2023-01-10 DIAGNOSIS — G459 Transient cerebral ischemic attack, unspecified: Secondary | ICD-10-CM | POA: Insufficient documentation

## 2023-01-10 DIAGNOSIS — I6782 Cerebral ischemia: Secondary | ICD-10-CM | POA: Diagnosis not present

## 2023-01-10 DIAGNOSIS — Z8673 Personal history of transient ischemic attack (TIA), and cerebral infarction without residual deficits: Secondary | ICD-10-CM | POA: Diagnosis not present

## 2023-01-16 ENCOUNTER — Telehealth: Payer: Self-pay

## 2023-01-16 NOTE — Telephone Encounter (Signed)
Lvm for pt to give office a call back in regards to labs:   Your A1c is in the prediabetic range.  It is generally stable.  You need to remain active and eat a healthy diet for this.  Please minimize sugar and carbohydrate intake.  Your cholesterol is adequately controlled. Written by Glori Luis, MD on 01/10/2023  1:21 PM EDT

## 2023-01-16 NOTE — Telephone Encounter (Signed)
-----   Message from Marikay Alar sent at 01/16/2023  9:05 AM EST ----- Please relay the MyChart result message to the patient.  Thanks.

## 2023-01-16 NOTE — Telephone Encounter (Signed)
Noted  

## 2023-01-16 NOTE — Telephone Encounter (Signed)
Patient returned your call . Results were read to the patient . Patient voiced understanding.

## 2023-02-07 ENCOUNTER — Encounter: Payer: Self-pay | Admitting: Family Medicine

## 2023-02-07 ENCOUNTER — Telehealth: Payer: BC Managed Care – PPO | Admitting: Family Medicine

## 2023-02-07 DIAGNOSIS — U071 COVID-19: Secondary | ICD-10-CM

## 2023-02-07 HISTORY — DX: COVID-19: U07.1

## 2023-02-07 NOTE — Progress Notes (Signed)
Virtual Visit via video Note  I connected with Alyssa Matthews today at 11:00 AM EST by a video enabled telemedicine application or telephone and verified that I am speaking with the correct person using two identifiers. Location patient: home Location provider: work Persons participating in the virtual visit: patient, provider  I discussed the limitations, risks, security and privacy concerns of performing an evaluation and management service by telephone and the availability of in person appointments. I also discussed with the patient that there may be a patient responsible charge related to this service. The patient expressed understanding and agreed to proceed.   Reason for visit: COVID  HPI: Patient notes onset of symptoms yesterday.  She notes she had bodyaches, fatigue, sore throat, and sinus congestion.  She felt feverish.  She had some cough.  She notes she feels quite a bit better today and has had no fever or sore throat.  She has been taking Tylenol and Alka-Seltzer cold and flu.  Reports a positive home COVID test yesterday.   ROS: See pertinent positives and negatives per HPI.  Past Medical History:  Diagnosis Date   Abdominal pain, left lower quadrant 06/29/2015   Anemia    Arthritis    right knee   Benign neoplasm of sigmoid colon    Bilateral numbness of feet 06/29/2015   Chronic left shoulder pain 05/17/2017   Decreased sex drive 65/78/4696   Dyspnea on exertion 04/18/2016   Elevated LDL cholesterol level 06/29/2015   History of blood transfusion    History of tobacco abuse 05/18/2015   Hypercholesteremia    Hypertension    Lacunar infarction (HCC)    a. MRI brain 6/19: remote lacunar infarcts involving the pons   Lipoma 04/16/2015   Nodule of left lobe of thyroid gland    a.) NM thyroid uptake scan 01/23/2017: large toxic autonomous nodule; I-123 uptake -- 6hr 8.4%, 24hr 9.5%. b.) Carotid doppler 08/31/2017: 2.2 cm LEFT thyroid nodule. c.) CTA neck  12/22/2017: 27 x 15 x 23 mm LEFT thyroid nodule extending to isthmus. d.) Tx'd with oral RAI (I-131) on 03/24/2017.   PFO (patent foramen ovale)    a. TTE 6/19: EF 60-65%, no RWMA, Gr1DD, mild MR, RVSF normal, patent foramen ovale was noted with a positive saline contrast study, PASP normal; b. 09/2018 Echo: EF 55-60%, impaired relaxation. No rwma. Nl RV fxn. PFO. Triv MR; c. 11/2019 Echo: EF 55-60%, no rwma, nl RV fxn. No atrial level shunt detected by doppler.   Prediabetes 05/18/2015   Rib contusion, left, initial encounter 06/28/2016   Sebaceous cyst of labia 12/05/2016   Shoulder pain, bilateral 01/15/2015   Statin intolerance 05/18/2015   Subclinical hyperthyroidism 06/28/2016   TIA (transient ischemic attack)    x3-last one in 2019   Vulvar lesion 10/31/2016   Wears dentures    partial upper   Weight loss 10/31/2016    Past Surgical History:  Procedure Laterality Date   BREAST CYST ASPIRATION  2015   COLONOSCOPY WITH PROPOFOL N/A 03/30/2015   Procedure: COLONOSCOPY WITH PROPOFOL;  Surgeon: Midge Minium, MD;  Location: [facility];  Service: Endoscopy;  Laterality: N/A;   DILATION AND CURETTAGE OF UTERUS     KNEE ARTHROSCOPY Right 01/06/2021   Procedure: KNEE ARTHROSCOPY WITH DEBRIDEMENT AND MENISCAL REPAIR;  Surgeon: Christena Flake, MD;  Location: [facility];  Service: Orthopedics;  Laterality: Right;   POLYPECTOMY  03/30/2015   Procedure: POLYPECTOMY;  Surgeon: Midge Minium, MD;  Location: [facility];  Service: Endoscopy;;    Family History  Problem Relation Age of Onset   Hypertension Mother    Cancer Father        In neck   Kidney disease Brother    Hypertension Brother    Drug abuse Paternal Grandmother    Breast cancer Neg Hx    Thyroid disease Neg Hx     SOCIAL HX: Former smoker   Current Outpatient Medications:    acetaminophen (TYLENOL) 500 MG tablet, Take 1,000 mg by mouth every 6 (six) hours as needed., Disp: , Rfl:    amLODipine (NORVASC) 5 MG  tablet, Take 1 tablet (5 mg total) by mouth every morning., Disp: 90 tablet, Rfl: 3   clopidogrel (PLAVIX) 75 MG tablet, Take 1 tablet (75 mg total) by mouth daily., Disp: 90 tablet, Rfl: 3   losartan (COZAAR) 50 MG tablet, TAKE 1 TABLET BY MOUTH EVERY DAY IN THE MORNING, Disp: 90 tablet, Rfl: 1   Multiple Vitamin (MULTIVITAMIN WITH MINERALS) TABS tablet, Take 1 tablet by mouth daily., Disp: , Rfl:    rosuvastatin (CRESTOR) 40 MG tablet, TAKE 1 TABLET BY MOUTH EVERY DAY, Disp: 90 tablet, Rfl: 2  EXAM:  VITALS per patient if applicable:  GENERAL: alert, oriented, appears well and in no acute distress  HEENT: atraumatic, conjunttiva clear, no obvious abnormalities on inspection of external nose and ears  NECK: normal movements of the head and neck  LUNGS: on inspection no signs of respiratory distress, breathing rate appears normal, no obvious gross SOB, gasping or wheezing  CV: no obvious cyanosis  MS: moves all visible extremities without noticeable abnormality  PSYCH/NEURO: pleasant and cooperative, no obvious depression or anxiety, speech and thought processing grossly intact  ASSESSMENT AND PLAN:  Discussed the following assessment and plan:  Problem List Items Addressed This Visit     COVID - Primary    At this point the patient is feeling quite a bit better.  We did discuss COVID-specific medication though given her improvement we jointly opted to hold off on this.  If she has worsening symptoms she will let us know and we can start that up through day 5 of illness.  Discussed supportive care with continued Tylenol and/or Alka-Seltzer cold and flu use as needed.  Discussed staying hydrated.  Discussed trying to stay away from others until she is feeling better.  Advised if she had to be around anybody else she needs to wear a mask.       No follow-ups on file.   I discussed the assessment and treatment plan with the patient. The patient was provided an opportunity to ask  questions and all were answered. The patient agreed with the plan and demonstrated an understanding of the instructions.   The patient was advised to call back or seek an in-person evaluation if the symptoms worsen or if the condition fails to improve as anticipated.  Marikay Alar, MD

## 2023-02-07 NOTE — Assessment & Plan Note (Signed)
At this point the patient is feeling quite a bit better.  We did discuss COVID-specific medication though given her improvement we jointly opted to hold off on this.  If she has worsening symptoms she will let us know and we can start that up through day 5 of illness.  Discussed supportive care with continued Tylenol and/or Alka-Seltzer cold and flu use as needed.  Discussed staying hydrated.  Discussed trying to stay away from others until she is feeling better.  Advised if she had to be around anybody else she needs to wear a mask.

## 2023-02-21 ENCOUNTER — Ambulatory Visit: Payer: BC Managed Care – PPO

## 2023-04-03 ENCOUNTER — Ambulatory Visit: Payer: BC Managed Care – PPO | Admitting: Podiatry

## 2023-05-01 ENCOUNTER — Ambulatory Visit: Payer: Self-pay | Admitting: Family Medicine

## 2023-05-01 NOTE — Telephone Encounter (Signed)
 Copied from CRM [id number]. Topic: Clinical - Red Word Triage >> May 01, 2023 11:56 AM Alyssa Matthews wrote: Red Word that prompted transfer to Nurse Triage: Sinus infection / nose constantly bleeding  Chief Complaint: common cold Symptoms: cough productive-brownish, stuffy nose, chest congestion, sore throat Frequency: Thursday Pertinent Negatives: Patient denies fever, SOB Disposition: [] ED /[] Urgent Care (no appt availability in office) / [x] Appointment(In office/virtual)/ []  [facility]/ [] Home Care/ [] Refused Recommended Disposition /[] [facility]/ []  Follow-up with PCP Additional Notes: pt states it could be related to sinus but unsure. Pt c/o when blow nose streaks on blood come out & this started today  Reason for Disposition  [1] Nasal discharge AND [2] present > 10 days  Answer Assessment - Initial Assessment Questions 1. ONSET: "When did the nasal discharge start?"      Thursday 2. AMOUNT: "How much discharge is there?"      Some discharge 3. COUGH: "Do you have a cough?" If Yes, ask: "Describe the color of your sputum" (clear, white, yellow, green)     Cough - brownish thick 4. RESPIRATORY DISTRESS: "Describe your breathing."      no 5. FEVER: "Do you have a fever?" If Yes, ask: "What is your temperature, how was it measured, and when did it start?"     no 6. SEVERITY: "Overall, how bad are you feeling right now?" (e.g., doesn't interfere with normal activities, staying home from school/work, staying in bed)      Not 100%- more nose stuffy 7. OTHER SYMPTOMS: "Do you have any other symptoms?" (e.g., sore throat, earache, wheezing, vomiting)     Sore throat, stuffy nose, cough-productive, blow nose has blood streaking 8. PREGNANCY: "Is there any chance you are pregnant?" "When was your last menstrual period?"     N/a  Protocols used: Common Cold-A-AH

## 2023-05-01 NOTE — Telephone Encounter (Signed)
 Noted

## 2023-05-02 ENCOUNTER — Ambulatory Visit: Payer: BC Managed Care – PPO | Admitting: Family Medicine

## 2023-05-02 ENCOUNTER — Encounter: Payer: Self-pay | Admitting: Family Medicine

## 2023-05-02 VITALS — BP 110/80 | HR 73 | Temp 98.9°F | Wt 185.4 lb

## 2023-05-02 DIAGNOSIS — J329 Chronic sinusitis, unspecified: Secondary | ICD-10-CM | POA: Insufficient documentation

## 2023-05-02 DIAGNOSIS — J029 Acute pharyngitis, unspecified: Secondary | ICD-10-CM

## 2023-05-02 DIAGNOSIS — J0140 Acute pansinusitis, unspecified: Secondary | ICD-10-CM | POA: Diagnosis not present

## 2023-05-02 DIAGNOSIS — R0981 Nasal congestion: Secondary | ICD-10-CM

## 2023-05-02 DIAGNOSIS — R0989 Other specified symptoms and signs involving the circulatory and respiratory systems: Secondary | ICD-10-CM

## 2023-05-02 LAB — POC COVID19 BINAXNOW: SARS Coronavirus 2 Ag: NEGATIVE

## 2023-05-02 MED ORDER — AMOXICILLIN-POT CLAVULANATE 875-125 MG PO TABS: 1.0000 | ORAL_TABLET | Freq: Two times a day (BID) | ORAL | 0 refills | Status: DC

## 2023-05-02 NOTE — Patient Instructions (Signed)
 Nice to see you.  We are treating you for a sinus infection. If you are not improving by early next week please let us know.

## 2023-05-02 NOTE — Progress Notes (Signed)
 Marikay Alar, MD Phone: [phone]  Alyssa Matthews is a 63 y.o. female who presents today for same day visit.   Congestion: Patient notes onset of symptoms about a week ago.  Notes sinus congestion.  She is blowing thick mucus out of her nose and also bright red blood.  Notes some cough.  No fever or postnasal drip.  Notes she cannot taste much of anything.  Notes her smell is starting to come back.  She did have COVID at the end of November.  Social History   Tobacco Use  Smoking Status Former   Current packs/day: 0.00   Average packs/day: 0.8 packs/day for 30.0 years (22.5 ttl pk-yrs)   Types: Cigarettes   Start date: 08/1987   Quit date: 08/2017   Years since quitting: 5.7  Smokeless Tobacco Never    Current Outpatient Medications on File Prior to Visit  Medication Sig Dispense Refill   acetaminophen (TYLENOL) 500 MG tablet Take 1,000 mg by mouth every 6 (six) hours as needed.     amLODipine (NORVASC) 5 MG tablet Take 1 tablet (5 mg total) by mouth every morning. 90 tablet 3   clopidogrel (PLAVIX) 75 MG tablet Take 1 tablet (75 mg total) by mouth daily. 90 tablet 3   losartan (COZAAR) 50 MG tablet TAKE 1 TABLET BY MOUTH EVERY DAY IN THE MORNING 90 tablet 1   Multiple Vitamin (MULTIVITAMIN WITH MINERALS) TABS tablet Take 1 tablet by mouth daily.     rosuvastatin (CRESTOR) 40 MG tablet TAKE 1 TABLET BY MOUTH EVERY DAY 90 tablet 2   No current facility-administered medications on file prior to visit.     ROS see history of present illness  Objective  Physical Exam Vitals:   05/02/23 0943  BP: 110/80  Pulse: 73  Temp: 98.9 F (37.2 C)  SpO2: 99%    BP Readings from Last 3 Encounters:  05/02/23 110/80  01/09/23 116/74  12/14/22 128/76   Wt Readings from Last 3 Encounters:  05/02/23 185 lb 6.4 oz (84.1 kg)  01/09/23 184 lb (83.5 kg)  12/14/22 180 lb 3.2 oz (81.7 kg)    Physical Exam Constitutional:      General: She is not in acute distress.     Appearance: She is not diaphoretic.  HENT:     Mouth/Throat:     Mouth: Mucous membranes are moist.     Pharynx: Oropharynx is clear.  Cardiovascular:     Rate and Rhythm: Normal rate and regular rhythm.     Heart sounds: Normal heart sounds.  Pulmonary:     Effort: Pulmonary effort is normal.     Breath sounds: Normal breath sounds.  Skin:    General: Skin is warm and dry.  Neurological:     Mental Status: She is alert.      Assessment/Plan: Please see individual problem list.  Acute non-recurrent pansinusitis Assessment & Plan: Symptoms are most consistent with sinusitis.  Given duration of symptoms and still having significant sinus congestion we will proceed with treatment with antibiotics.  Patient will start Augmentin 1 tablet twice daily for 7 days.   Discussed risk of diarrhea with the antibiotic.  If she develops excessive diarrhea she will let us know.  If not improving by early next week she will let us know.  Orders: -     Amoxicillin-Pot Clavulanate; Take 1 tablet by mouth 2 (two) times daily.  Dispense: 14 tablet; Refill: 0  Sinus congestion -     POC  COVID-19 BinaxNow  COVID test was negative.   Return if symptoms worsen or fail to improve.   Marikay Alar, MD [facility]

## 2023-05-02 NOTE — Assessment & Plan Note (Signed)
 Symptoms are most consistent with sinusitis.  Given duration of symptoms and still having significant sinus congestion we will proceed with treatment with antibiotics.  Patient will start Augmentin 1 tablet twice daily for 7 days.   Discussed risk of diarrhea with the antibiotic.  If she develops excessive diarrhea she will let us know.  If not improving by early next week she will let us know.

## 2023-05-17 ENCOUNTER — Other Ambulatory Visit: Payer: Self-pay

## 2023-05-17 DIAGNOSIS — I10 Essential (primary) hypertension: Secondary | ICD-10-CM

## 2023-05-17 MED ORDER — AMLODIPINE BESYLATE 5 MG PO TABS: 5.0000 mg | ORAL_TABLET | ORAL | 3 refills | Status: DC

## 2023-05-17 MED ORDER — LOSARTAN POTASSIUM 50 MG PO TABS: 50.0000 mg | ORAL_TABLET | Freq: Every day | ORAL | 1 refills | Status: DC

## 2023-07-10 ENCOUNTER — Encounter: Payer: BC Managed Care – PPO | Admitting: Family Medicine

## 2023-07-11 ENCOUNTER — Ambulatory Visit: Admitting: Podiatry

## 2023-07-11 ENCOUNTER — Encounter: Payer: Self-pay | Admitting: Podiatry

## 2023-07-11 DIAGNOSIS — Q666 Other congenital valgus deformities of feet: Secondary | ICD-10-CM | POA: Diagnosis not present

## 2023-07-11 DIAGNOSIS — M19071 Primary osteoarthritis, right ankle and foot: Secondary | ICD-10-CM | POA: Diagnosis not present

## 2023-07-11 NOTE — Progress Notes (Signed)
 Subjective:  Patient ID: Alyssa Matthews, female    DOB: 1960/12/22,  MRN: [id number]  Chief Complaint  Patient presents with   Arthritis    63 y.o. female presents with the above complaint.  Patient presents with acute onset of right first MPJ.  She states the injection lasted many years.  She is here to get another injection.  She would also like to discuss shoe gear modification orthotics.  She would like to get another pair of orthotics denies any other acute complaints.   Review of Systems: Negative except as noted in the HPI. Denies N/V/F/Ch.  Past Medical History:  Diagnosis Date   Abdominal pain, left lower quadrant 06/29/2015   Anemia    Arthritis    right knee   Benign neoplasm of sigmoid colon    Bilateral numbness of feet 06/29/2015   Chronic left shoulder pain 05/17/2017   Decreased sex drive 40/98/1191   Dyspnea on exertion 04/18/2016   Elevated LDL cholesterol level 06/29/2015   History of blood transfusion    History of tobacco abuse 05/18/2015   Hypercholesteremia    Hypertension    Lacunar infarction (HCC)    a. MRI brain 6/19: remote lacunar infarcts involving the pons   Lipoma 04/16/2015   Nodule of left lobe of thyroid  gland    a.) NM thyroid  uptake scan 01/23/2017: large toxic autonomous nodule; I-123 uptake -- 6hr 8.4%, 24hr 9.5%. b.) Carotid doppler 08/31/2017: 2.2 cm LEFT thyroid  nodule. c.) CTA neck 12/22/2017: 27 x 15 x 23 mm LEFT thyroid  nodule extending to isthmus. d.) Tx'd with oral RAI (I-131) on 03/24/2017.   PFO (patent foramen ovale)    a. TTE 6/19: EF 60-65%, no RWMA, Gr1DD, mild MR, RVSF normal, patent foramen ovale was noted with a positive saline contrast study, PASP normal; b. 09/2018 Echo: EF 55-60%, impaired relaxation. No rwma. Nl RV fxn. PFO. Triv MR; c. 11/2019 Echo: EF 55-60%, no rwma, nl RV fxn. No atrial level shunt detected by doppler.   Prediabetes 05/18/2015   Rib contusion, left, initial encounter 06/28/2016   Sebaceous cyst of  labia 12/05/2016   Shoulder pain, bilateral 01/15/2015   Statin intolerance 05/18/2015   Subclinical hyperthyroidism 06/28/2016   TIA (transient ischemic attack)    x3-last one in 2019   Vulvar lesion 10/31/2016   Wears dentures    partial upper   Weight loss 10/31/2016    Current Outpatient Medications:    acetaminophen  (TYLENOL ) 500 MG tablet, Take 1,000 mg by mouth every 6 (six) hours as needed., Disp: , Rfl:    amLODipine  (NORVASC ) 5 MG tablet, Take 1 tablet (5 mg total) by mouth every morning., Disp: 90 tablet, Rfl: 3   amoxicillin -clavulanate (AUGMENTIN ) 875-125 MG tablet, Take 1 tablet by mouth 2 (two) times daily., Disp: 14 tablet, Rfl: 0   clopidogrel  (PLAVIX ) 75 MG tablet, Take 1 tablet (75 mg total) by mouth daily., Disp: 90 tablet, Rfl: 3   losartan  (COZAAR ) 50 MG tablet, Take 1 tablet (50 mg total) by mouth daily., Disp: 90 tablet, Rfl: 1   Multiple Vitamin (MULTIVITAMIN WITH MINERALS) TABS tablet, Take 1 tablet by mouth daily., Disp: , Rfl:    rosuvastatin  (CRESTOR ) 40 MG tablet, TAKE 1 TABLET BY MOUTH EVERY DAY, Disp: 90 tablet, Rfl: 2  Social History   Tobacco Use  Smoking Status Former   Current packs/day: 0.00   Average packs/day: 0.8 packs/day for 30.0 years (22.5 ttl pk-yrs)   Types: Cigarettes   Start date: 08/1987  Quit date: 08/2017   Years since quitting: 5.9  Smokeless Tobacco Never    No Known Allergies Objective:  There were no vitals filed for this visit. There is no height or weight on file to calculate BMI. Constitutional Well developed. Well nourished.  Vascular Dorsalis pedis pulses palpable bilaterally. Posterior tibial pulses palpable bilaterally. Capillary refill normal to all digits.  No cyanosis or clubbing noted. Pedal hair growth normal.  Neurologic Normal speech. Oriented to person, place, and time. Epicritic sensation to light touch grossly present bilaterally.  Dermatologic Nails well groomed and normal in appearance. No open  wounds. No skin lesions.  Orthopedic:  Pain on palpation to the right first dorsal exostosis of the first MPJ.  Pain with end range of motion of the first MPJ.  Pain with intra-articular motion of the right first MPJ.  Pain with active and passive range of motion.   Radiographs: 3 views of skeletally mature adult: There is an increase in soft tissue density and volume on the first MPJ.  Multiple osteophytes noted within the joint.  There is subchondral sclerosis present at the base of the proximal phalanx.  There is dorsal exostosis with laxity noted at the first MPJ. Assessment:   1. Arthritis of first metatarsophalangeal (MTP) joint of right foot   2. Pes planovalgus     Plan:  Patient was evaluated and treated and all questions answered.  Right first MPJ arthritis with osteophytes -I explained to the patient the the etiology and various treatment options available for arthritis of the first MPJ of the right foot.  I explained to the patient that certain shoe gears including's are still toe can definitely help as a limits the amount of range of motion of the first MPJ joint.  However I believe that without limiting the motion of the joints she will always have pain. -I believe patient will also benefit from a steroid injection to help calm down the acute inflammation to the joint. A steroid injection was performed at right first MTPJ joint using 1% plain Lidocaine  and 10 mg of Kenalog. This was well tolerated.  Pes planovalgus -I explained to patient the etiology of pes planovalgus and relationship with Planter fasciitis and various treatment options were discussed.  Given patient foot structure in the setting of Planter fasciitis I believe patient will benefit from custom-made orthotics to help control the hindfoot motion support the arch of the foot and take the stress away from plantar fascial.  Patient agrees with the plan like to proceed with orthotics -Patient was casted for orthotics  with Morton's extension   No follow-ups on file.

## 2023-08-15 ENCOUNTER — Ambulatory Visit: Admitting: Podiatry

## 2023-08-15 DIAGNOSIS — M19071 Primary osteoarthritis, right ankle and foot: Secondary | ICD-10-CM | POA: Diagnosis not present

## 2023-08-15 NOTE — Progress Notes (Signed)
 Subjective:  Patient ID: Alyssa Matthews, female    DOB: 1960/03/16,  MRN: [id number]  Chief Complaint  Patient presents with   Arthritis    63 y.o. female presents with the above complaint.  Patient presents for follow-up of right first metatarsophalangeal joint pain.  She states this started doing much better.  She has a little bit of residual pain but overall much better denies any other acute complaints  Review of Systems: Negative except as noted in the HPI. Denies N/V/F/Ch.  Past Medical History:  Diagnosis Date   Abdominal pain, left lower quadrant 06/29/2015   Anemia    Arthritis    right knee   Benign neoplasm of sigmoid colon    Bilateral numbness of feet 06/29/2015   Chronic left shoulder pain 05/17/2017   Decreased sex drive 64/33/2951   Dyspnea on exertion 04/18/2016   Elevated LDL cholesterol level 06/29/2015   History of blood transfusion    History of tobacco abuse 05/18/2015   Hypercholesteremia    Hypertension    Lacunar infarction (HCC)    a. MRI brain 6/19: remote lacunar infarcts involving the pons   Lipoma 04/16/2015   Nodule of left lobe of thyroid  gland    a.) NM thyroid  uptake scan 01/23/2017: large toxic autonomous nodule; I-123 uptake -- 6hr 8.4%, 24hr 9.5%. b.) Carotid doppler 08/31/2017: 2.2 cm LEFT thyroid  nodule. c.) CTA neck 12/22/2017: 27 x 15 x 23 mm LEFT thyroid  nodule extending to isthmus. d.) Tx'd with oral RAI (I-131) on 03/24/2017.   PFO (patent foramen ovale)    a. TTE 6/19: EF 60-65%, no RWMA, Gr1DD, mild MR, RVSF normal, patent foramen ovale was noted with a positive saline contrast study, PASP normal; b. 09/2018 Echo: EF 55-60%, impaired relaxation. No rwma. Nl RV fxn. PFO. Triv MR; c. 11/2019 Echo: EF 55-60%, no rwma, nl RV fxn. No atrial level shunt detected by doppler.   Prediabetes 05/18/2015   Rib contusion, left, initial encounter 06/28/2016   Sebaceous cyst of labia 12/05/2016   Shoulder pain, bilateral 01/15/2015   Statin  intolerance 05/18/2015   Subclinical hyperthyroidism 06/28/2016   TIA (transient ischemic attack)    x3-last one in 2019   Vulvar lesion 10/31/2016   Wears dentures    partial upper   Weight loss 10/31/2016    Current Outpatient Medications:    acetaminophen  (TYLENOL ) 500 MG tablet, Take 1,000 mg by mouth every 6 (six) hours as needed., Disp: , Rfl:    amLODipine  (NORVASC ) 5 MG tablet, Take 1 tablet (5 mg total) by mouth every morning., Disp: 90 tablet, Rfl: 3   amoxicillin -clavulanate (AUGMENTIN ) 875-125 MG tablet, Take 1 tablet by mouth 2 (two) times daily., Disp: 14 tablet, Rfl: 0   clopidogrel  (PLAVIX ) 75 MG tablet, Take 1 tablet (75 mg total) by mouth daily., Disp: 90 tablet, Rfl: 3   losartan  (COZAAR ) 50 MG tablet, Take 1 tablet (50 mg total) by mouth daily., Disp: 90 tablet, Rfl: 1   Multiple Vitamin (MULTIVITAMIN WITH MINERALS) TABS tablet, Take 1 tablet by mouth daily., Disp: , Rfl:    rosuvastatin  (CRESTOR ) 40 MG tablet, TAKE 1 TABLET BY MOUTH EVERY DAY, Disp: 90 tablet, Rfl: 2  Social History   Tobacco Use  Smoking Status Former   Current packs/day: 0.00   Average packs/day: 0.8 packs/day for 30.0 years (22.5 ttl pk-yrs)   Types: Cigarettes   Start date: 08/1987   Quit date: 08/2017   Years since quitting: 6.0  Smokeless Tobacco Never  No Known Allergies Objective:  There were no vitals filed for this visit. There is no height or weight on file to calculate BMI. Constitutional Well developed. Well nourished.  Vascular Dorsalis pedis pulses palpable bilaterally. Posterior tibial pulses palpable bilaterally. Capillary refill normal to all digits.  No cyanosis or clubbing noted. Pedal hair growth normal.  Neurologic Normal speech. Oriented to person, place, and time. Epicritic sensation to light touch grossly present bilaterally.  Dermatologic Nails well groomed and normal in appearance. No open wounds. No skin lesions.  Orthopedic:  Pain on palpation to the  right first dorsal exostosis of the first MPJ.  Pain with end range of motion of the first MPJ.  Pain with intra-articular motion of the right first MPJ.  Pain with active and passive range of motion.   Radiographs: 3 views of skeletally mature adult: There is an increase in soft tissue density and volume on the first MPJ.  Multiple osteophytes noted within the joint.  There is subchondral sclerosis present at the base of the proximal phalanx.  There is dorsal exostosis with laxity noted at the first MPJ. Assessment:   No diagnosis found.   Plan:  Patient was evaluated and treated and all questions answered.  Right first MPJ arthritis with osteophytes -I explained to the patient the the etiology and various treatment options available for arthritis of the first MPJ of the right foot.  I explained to the patient that certain shoe gears including's are still toe can definitely help as a limits the amount of range of motion of the first MPJ joint.  However I believe that without limiting the motion of the joints she will always have pain. -I believe patient will also benefit from a steroid injection to help calm down the acute inflammation to the joint. Another steroid injection was performed at right first MTPJ joint using 1% plain Lidocaine  and 10 mg of Kenalog. This was well tolerated.  Pes planovalgus -I explained to patient the etiology of pes planovalgus and relationship with Planter fasciitis and various treatment options were discussed.  Given patient foot structure in the setting of Planter fasciitis I believe patient will benefit from custom-made orthotics to help control the hindfoot motion support the arch of the foot and take the stress away from plantar fascial.  Patient agrees with the plan like to proceed with orthotics - Patient is awaiting orthotics with Morton's extension   No follow-ups on file.

## 2023-08-17 ENCOUNTER — Ambulatory Visit: Admitting: Podiatry

## 2023-08-17 DIAGNOSIS — M19071 Primary osteoarthritis, right ankle and foot: Secondary | ICD-10-CM

## 2023-08-17 DIAGNOSIS — Q666 Other congenital valgus deformities of feet: Secondary | ICD-10-CM

## 2023-08-17 NOTE — Progress Notes (Signed)
 Orthotics were dispensed and functioning well break-in period was discussed.  No acute complaints.

## 2023-09-04 ENCOUNTER — Encounter

## 2023-09-18 ENCOUNTER — Encounter: Admitting: Nurse Practitioner

## 2023-09-20 ENCOUNTER — Ambulatory Visit

## 2023-09-20 ENCOUNTER — Ambulatory Visit: Payer: Self-pay

## 2023-09-20 VITALS — BP 106/70 | HR 68 | Temp 98.6°F | Ht 65.0 in | Wt 189.0 lb

## 2023-09-20 DIAGNOSIS — Z8673 Personal history of transient ischemic attack (TIA), and cerebral infarction without residual deficits: Secondary | ICD-10-CM

## 2023-09-20 DIAGNOSIS — R7303 Prediabetes: Secondary | ICD-10-CM | POA: Diagnosis not present

## 2023-09-20 DIAGNOSIS — I10 Essential (primary) hypertension: Secondary | ICD-10-CM | POA: Diagnosis not present

## 2023-09-20 DIAGNOSIS — Z8639 Personal history of other endocrine, nutritional and metabolic disease: Secondary | ICD-10-CM

## 2023-09-20 DIAGNOSIS — E785 Hyperlipidemia, unspecified: Secondary | ICD-10-CM

## 2023-09-20 DIAGNOSIS — G459 Transient cerebral ischemic attack, unspecified: Secondary | ICD-10-CM

## 2023-09-20 DIAGNOSIS — Z23 Encounter for immunization: Secondary | ICD-10-CM | POA: Insufficient documentation

## 2023-09-20 DIAGNOSIS — E782 Mixed hyperlipidemia: Secondary | ICD-10-CM

## 2023-09-20 DIAGNOSIS — J0140 Acute pansinusitis, unspecified: Secondary | ICD-10-CM

## 2023-09-20 DIAGNOSIS — I679 Cerebrovascular disease, unspecified: Secondary | ICD-10-CM | POA: Diagnosis not present

## 2023-09-20 DIAGNOSIS — E66811 Obesity, class 1: Secondary | ICD-10-CM

## 2023-09-20 LAB — HEMOGLOBIN A1C: Hgb A1c MFr Bld: 6.6 % — ABNORMAL HIGH (ref 4.6–6.5)

## 2023-09-20 MED ORDER — LOSARTAN POTASSIUM 50 MG PO TABS: 50.0000 mg | ORAL_TABLET | Freq: Every day | ORAL | 3 refills | Status: AC

## 2023-09-20 MED ORDER — AMLODIPINE BESYLATE 5 MG PO TABS: 5.0000 mg | ORAL_TABLET | ORAL | 3 refills | Status: AC

## 2023-09-20 MED ORDER — CLOPIDOGREL BISULFATE 75 MG PO TABS: 75.0000 mg | ORAL_TABLET | Freq: Every day | ORAL | 3 refills | Status: AC

## 2023-09-20 MED ORDER — ROSUVASTATIN CALCIUM 40 MG PO TABS: 40.0000 mg | ORAL_TABLET | Freq: Every day | ORAL | 3 refills | Status: AC

## 2023-09-20 NOTE — Progress Notes (Signed)
 Established Patient Office Visit TOC from Dr. Maribeth    Subjective  Patient ID: Alyssa Matthews, female    DOB: 1960-05-27  Age: 63 y.o. MRN: [id number]  Chief Complaint  Patient presents with   Establish Care    She  has a past medical history of Abdominal pain, left lower quadrant (06/29/2015), Anemia, Arthritis, Benign neoplasm of sigmoid colon, Bilateral numbness of feet (06/29/2015), Chronic left shoulder pain (05/17/2017), COVID (02/07/2023), Decreased sex drive (97/97/7982), Dyspnea on exertion (04/18/2016), Elevated LDL cholesterol level (06/29/2015), History of blood transfusion, History of hypokalemia (07/01/2022), History of tobacco abuse (05/18/2015), Hypercholesteremia, Hypertension, Lacunar infarction [facility]), Lipoma (04/16/2015), Nodule of left lobe of thyroid  gland, PFO (patent foramen ovale), Prediabetes (05/18/2015), Rib contusion, left, initial encounter (06/28/2016), Sebaceous cyst of labia (12/05/2016), Shoulder pain, bilateral (01/15/2015), Statin intolerance (05/18/2015), Subclinical hyperthyroidism (06/28/2016), TIA (transient ischemic attack), Vulvar lesion (10/31/2016), Wears dentures, and Weight loss (10/31/2016).  HPI Discussed the use of AI scribe software for clinical note transcription with the patient, who gave verbal consent to proceed.  History of Present Illness Alyssa Matthews is a 63 year old female with a history of TIA, prediabetes, HTN, hyperlipidemia who presents to establish care and medication check.   H/O TIA, small vessel disease, chronic lacunar infarct: No new symptoms such as weakness, numbness, or tingling have occurred since her last visit. She is scheduled to see her neurologist next month. She is on Plavix  and Crestor . She had a brain MRI in October of last year which showed: Chronic small vessel ischemia with mild progression since 2019. Chronic pontine lacunar infarct.   HTN: She is currently taking amlodipine  and losartan  50 mg  for hypertension. She used to check her blood pressure at home but has not been doing so recently. No chest pain or unusual headaches. She experiences leg swelling at night, particularly when she has been on her feet for extended time at work, which resolves after elevating her legs.   Prediabetes, obesity: She is concerned about weight gain despite walking a mile every morning. She has difficulty losing weight and has noticed an increase in clothing size. Her diet includes some fried foods, but she has reduced her intake of [place] fries and sodas, opting for zero sugar sodas instead. She is aware of her prediabetes status and is trying to manage her diet accordingly.  She drinks half a glass of red wine every night and quit smoking nine years ago after her TIA. Her husband has noted occasional snoring, but she generally wakes up feeling refreshed.  She has a h/o 20 plus years of cigarettes smoking, quit in 2019. She deferred low dose ct lung cancer screening due to cost. No cough, wheezing.     ROS As per HPI    Objective:     BP 106/70 (BP Location: Right Arm, Patient Position: Sitting, Cuff Size: Normal)   Pulse 68   Temp 98.6 F (37 C) (Oral)   Ht 5' 5 (1.651 m)   Wt 189 lb (85.7 kg)   SpO2 99%   BMI 31.45 kg/m      09/20/2023    1:29 PM 02/07/2023   11:15 AM 01/09/2023   10:06 AM  Depression screen PHQ 2/9  Decreased Interest 0 0 0  Down, Depressed, Hopeless 0 0 0  PHQ - 2 Score 0 0 0  Altered sleeping 0 0 0  Tired, decreased energy 0 0 1  Change in appetite 0 0 0  Feeling bad or  failure about yourself  0 0 0  Trouble concentrating 0 0 0  Moving slowly or fidgety/restless 0 0 0  Suicidal thoughts 0 0 0  PHQ-9 Score 0 0 1  Difficult doing work/chores Not difficult at all Not difficult at all Not difficult at all      09/20/2023    1:29 PM 02/07/2023   11:15 AM 01/09/2023   10:07 AM 07/06/2022    9:36 AM  GAD 7 : Generalized Anxiety Score  Nervous, Anxious, on Edge  0 0 0 0  Control/stop worrying 0 0 0 0  Worry too much - different things 0 0 0 0  Trouble relaxing 0 0 0 0  Restless 0 0 0 0  Easily annoyed or irritable 0 0 0 0  Afraid - awful might happen 0 0 0 0  Total GAD 7 Score 0 0 0 0  Anxiety Difficulty Not difficult at all Not difficult at all Not difficult at all Not difficult at all      09/20/2023    1:29 PM 02/07/2023   11:15 AM 01/09/2023   10:06 AM  Depression screen PHQ 2/9  Decreased Interest 0 0 0  Down, Depressed, Hopeless 0 0 0  PHQ - 2 Score 0 0 0  Altered sleeping 0 0 0  Tired, decreased energy 0 0 1  Change in appetite 0 0 0  Feeling bad or failure about yourself  0 0 0  Trouble concentrating 0 0 0  Moving slowly or fidgety/restless 0 0 0  Suicidal thoughts 0 0 0  PHQ-9 Score 0 0 1  Difficult doing work/chores Not difficult at all Not difficult at all Not difficult at all      09/20/2023    1:29 PM 02/07/2023   11:15 AM 01/09/2023   10:07 AM 07/06/2022    9:36 AM  GAD 7 : Generalized Anxiety Score  Nervous, Anxious, on Edge 0 0 0 0  Control/stop worrying 0 0 0 0  Worry too much - different things 0 0 0 0  Trouble relaxing 0 0 0 0  Restless 0 0 0 0  Easily annoyed or irritable 0 0 0 0  Afraid - awful might happen 0 0 0 0  Total GAD 7 Score 0 0 0 0  Anxiety Difficulty Not difficult at all Not difficult at all Not difficult at all Not difficult at all   SDOH Screenings   Food Insecurity: No Food Insecurity (07/05/2022)  Housing: Low Risk  (07/05/2022)  Transportation Needs: No Transportation Needs (07/05/2022)  Alcohol Screen: Low Risk  (07/05/2022)  Depression (PHQ2-9): Low Risk  (09/20/2023)  Financial Resource Strain: Low Risk  (07/05/2022)  Physical Activity: Unknown (07/05/2022)  Social Connections: Unknown (07/05/2022)  Stress: No Stress Concern Present (07/05/2022)  Tobacco Use: Medium Risk (09/20/2023)     Physical Exam Constitutional:      Appearance: Normal appearance.  HENT:     Head: Normocephalic and  atraumatic.     Right Ear: Tympanic membrane normal.     Left Ear: Tympanic membrane normal.     Mouth/Throat:     Mouth: Mucous membranes are moist.  Neck:     Thyroid : No thyroid  mass or thyroid  tenderness.  Cardiovascular:     Rate and Rhythm: Normal rate and regular rhythm.  Pulmonary:     Effort: Pulmonary effort is normal.     Breath sounds: Normal breath sounds.  Abdominal:     General: Bowel sounds are normal.  Palpations: Abdomen is soft.     Tenderness: There is no abdominal tenderness. There is no guarding.  Musculoskeletal:     Cervical back: Neck supple.     Right lower leg: No edema.     Left lower leg: No edema.  Skin:    General: Skin is warm.  Neurological:     Mental Status: She is alert and oriented to person, place, and time.  Psychiatric:        Mood and Affect: Mood normal.        Behavior: Behavior normal.       No results found for any visits on 09/20/23.  The ASCVD Risk score (Arnett DK, et al., 2019) failed to calculate for the following reasons:   The valid total cholesterol range is 130 to 320 mg/dL      Assessment & Plan:   Essential hypertension Assessment & Plan: Chronic issue.  Adequately controlled.  Continue amlodipine  5 mg, losartan  50 mg daily. Refill sent   Orders: -     amLODIPine  Besylate; Take 1 tablet (5 mg total) by mouth every morning.  Dispense: 90 tablet; Refill: 3 -     Losartan  Potassium; Take 1 tablet (50 mg total) by mouth daily.  Dispense: 90 tablet; Refill: 3  Cerebrovascular small vessel disease Assessment & Plan: With chronic lacunar infract (based on MRI form 12/2022), no new stroke, TIA like symptoms. Discussed managing risk factors such as hypertension, prediabetes, and lifestyle modifications like a healthy diet and regular exercise. Continue Plavix  75 mg daily, Rosuvastatin  40 mg daily.   Orders: -     Clopidogrel  Bisulfate; Take 1 tablet (75 mg total) by mouth daily.  Dispense: 90 tablet; Refill:  3  Mixed hyperlipidemia Assessment & Plan: Chronic issue. Last LDL within goal of <70, continue Crestor  40 mg daily. Refill sent.  Orders: -     Clopidogrel  Bisulfate; Take 1 tablet (75 mg total) by mouth daily.  Dispense: 90 tablet; Refill: 3 -     Rosuvastatin  Calcium ; Take 1 tablet (40 mg total) by mouth daily.  Dispense: 90 tablet; Refill: 3  Prediabetes Assessment & Plan: Chronic issue.  Check A1c. Discussed preventative measures to reduce risk of progression to diabetes.   Orders: -     Hemoglobin A1c  Obesity (BMI 30.0-34.9) Assessment & Plan: Diet: Emphasize whole grains, lean proteins, fruits, and vegetables. Limit processed foods and sugary drinks. Portion control recommend.  Exercise: Aim for 150 minutes of moderate aerobic activity weekly plus strength training twice a week.  Weight Loss: Target 5-10% reduction if overweight.   Need for shingles vaccine Assessment & Plan: If you are interested in the shingles vaccine series (Shingrix), call your insurance or pharmacy to check on coverage and location it must be given.  If affordable - you can schedule it here or at your pharmacy depending on coverage.       Return in about 6 months (around 03/22/2024) for Chronic follow up .   Luke Shade, MD

## 2023-09-20 NOTE — Assessment & Plan Note (Signed)
 Chronic issue. Last LDL within goal of <70, continue Crestor  40 mg daily. Refill sent.

## 2023-09-20 NOTE — Patient Instructions (Addendum)
 If you are interested in the shingles vaccine series (Shingrix), call your insurance or pharmacy to check on coverage and location it must be given.  If affordable - you can schedule it here or at your pharmacy depending on coverage.   You can look into updating your tetanus booster through your local pharmacy as well.   Diet: Emphasize whole grains, lean proteins, fruits, and vegetables. Limit processed foods and sugary drinks. Exercise: Aim for 150 minutes of moderate aerobic activity weekly plus strength training twice a week.  Weight Loss: Target 5-10% reduction if overweight.  Lower leg swelling:  Advise on leg elevation, weight management, and regular exercise. Recommend wearing compression stockings.

## 2023-09-20 NOTE — Assessment & Plan Note (Signed)
 Chronic issue.  Check A1c. Discussed preventative measures to reduce risk of progression to diabetes.

## 2023-09-20 NOTE — Assessment & Plan Note (Signed)
 With chronic lacunar infract (based on MRI form 12/2022), no new stroke, TIA like symptoms. Discussed managing risk factors such as hypertension, prediabetes, and lifestyle modifications like a healthy diet and regular exercise. Continue Plavix  75 mg daily, Rosuvastatin  40 mg daily.

## 2023-09-20 NOTE — Assessment & Plan Note (Signed)
 Diet: Emphasize whole grains, lean proteins, fruits, and vegetables. Limit processed foods and sugary drinks. Portion control recommend.  Exercise: Aim for 150 minutes of moderate aerobic activity weekly plus strength training twice a week.  Weight Loss: Target 5-10% reduction if overweight.

## 2023-09-20 NOTE — Assessment & Plan Note (Signed)
 Chronic issue.  Adequately controlled.  Continue amlodipine  5 mg, losartan  50 mg daily. Refill sent

## 2023-09-20 NOTE — Assessment & Plan Note (Signed)
 If you are interested in the shingles vaccine series (Shingrix), call your insurance or pharmacy to check on coverage and location it must be given.  If affordable - you can schedule it here or at your pharmacy depending on coverage.

## 2023-11-21 ENCOUNTER — Other Ambulatory Visit: Payer: Self-pay

## 2023-11-21 DIAGNOSIS — Z1231 Encounter for screening mammogram for malignant neoplasm of breast: Secondary | ICD-10-CM

## 2024-01-01 ENCOUNTER — Ambulatory Visit
Admission: RE | Admit: 2024-01-01 | Discharge: 2024-01-01 | Disposition: A | Discharge status: Home / Self Care | Source: Ambulatory Visit

## 2024-01-01 DIAGNOSIS — Z1231 Encounter for screening mammogram for malignant neoplasm of breast: Secondary | ICD-10-CM | POA: Insufficient documentation

## 2024-03-25 ENCOUNTER — Ambulatory Visit

## 2024-03-25 ENCOUNTER — Ambulatory Visit: Payer: Self-pay

## 2024-03-25 VITALS — BP 120/70 | HR 82 | Temp 98.6°F | Ht 65.0 in | Wt 188.6 lb

## 2024-03-25 DIAGNOSIS — Z23 Encounter for immunization: Secondary | ICD-10-CM | POA: Diagnosis not present

## 2024-03-25 DIAGNOSIS — I10 Essential (primary) hypertension: Secondary | ICD-10-CM | POA: Diagnosis not present

## 2024-03-25 DIAGNOSIS — E66811 Obesity, class 1: Secondary | ICD-10-CM | POA: Diagnosis not present

## 2024-03-25 DIAGNOSIS — I679 Cerebrovascular disease, unspecified: Secondary | ICD-10-CM

## 2024-03-25 DIAGNOSIS — D649 Anemia, unspecified: Secondary | ICD-10-CM

## 2024-03-25 DIAGNOSIS — F17210 Nicotine dependence, cigarettes, uncomplicated: Secondary | ICD-10-CM | POA: Diagnosis not present

## 2024-03-25 DIAGNOSIS — R7303 Prediabetes: Secondary | ICD-10-CM | POA: Diagnosis not present

## 2024-03-25 DIAGNOSIS — E782 Mixed hyperlipidemia: Secondary | ICD-10-CM | POA: Diagnosis not present

## 2024-03-25 DIAGNOSIS — Z6831 Body mass index (BMI) 31.0-31.9, adult: Secondary | ICD-10-CM

## 2024-03-25 LAB — COMPREHENSIVE METABOLIC PANEL WITH GFR
ALT: 13 U/L (ref 3–35)
AST: 14 U/L (ref 5–37)
Albumin: 4.4 g/dL (ref 3.5–5.2)
Alkaline Phosphatase: 74 U/L (ref 39–117)
BUN: 12 mg/dL (ref 6–23)
CO2: 26 meq/L (ref 19–32)
Calcium: 9 mg/dL (ref 8.4–10.5)
Chloride: 105 meq/L (ref 96–112)
Creatinine, Ser: 0.64 mg/dL (ref 0.40–1.20)
GFR: 95.35 mL/min
Glucose, Bld: 100 mg/dL — ABNORMAL HIGH (ref 70–99)
Potassium: 3.9 meq/L (ref 3.5–5.1)
Sodium: 140 meq/L (ref 135–145)
Total Bilirubin: 0.4 mg/dL (ref 0.2–1.2)
Total Protein: 7.1 g/dL (ref 6.0–8.3)

## 2024-03-25 LAB — LIPID PANEL
Cholesterol: 138 mg/dL (ref 28–200)
HDL: 70.7 mg/dL
LDL Cholesterol: 52 mg/dL (ref 10–99)
NonHDL: 67.01
Total CHOL/HDL Ratio: 2
Triglycerides: 76 mg/dL (ref 10.0–149.0)
VLDL: 15.2 mg/dL (ref 0.0–40.0)

## 2024-03-25 LAB — HEMOGLOBIN A1C: Hgb A1c MFr Bld: 6.5 % (ref 4.6–6.5)

## 2024-03-25 NOTE — Assessment & Plan Note (Addendum)
 BP on arrival elevated.  Repeat BP within goal of <130/80 mmHg Continue Amlodipine  5 mg and Losartan  50 mg daily.  Check CMP, lab ordered.  Orders:   Comprehensive metabolic panel with GFR

## 2024-03-25 NOTE — Progress Notes (Signed)
 "  Established Patient Office Visit   Subjective  Patient ID: Alyssa Matthews, female    DOB: 20-Oct-1960  Age: 64 y.o. MRN: [id number]  Chief Complaint  Patient presents with   Weight Gain   Hypertension   Hyperlipidemia    Discussed the use of AI scribe software for clinical note transcription with the patient, who gave verbal consent to proceed.  History of Present Illness Alyssa Matthews is a 64 year old female who presents for chronic disease management.   - Overweight: She is concerned about her weight, noting a gain over the holidays, and currently weighs 188 pounds. She aims to lose 5 to 10 pounds. Her knees hurt. She used to walk regularly but has been unable to maintain this due to her work schedule, which involves working second shift and long hours. She has not been following a strict diet recently, having resumed eating fries, but she avoids sugary drinks, opting for zero-calorie sodas instead.  he uses Voltaren gel and Tylenol  for knee pain, which she applies daily before work, and finds this helps alleviate the pain.   - H/O TIS 2019: She has a history of a cerebrovascular accident in 2019 and saw a neurologist in September of the previous year. No TIA symptoms. Taking Crestor  40 mg daily, Plavix  75 mg daily.   - H/O smoking cigarettes, about 22 pack year history, quit in 2019:  She also has a history of smoking for 22 years and is currently smoking. She was supposed to have a CT scan for lung screening but canceled it due to work commitments.  - Her last colonoscopy was in January 2017, normal.  - She has a history of elevated blood sugar levels, with a hemoglobin A1c of 6.6% previously. She does not consume sugary drinks and uses sugar substitutes like Splenda. She experiences occasional cravings for sweets but tries to limit her intake of bread and pasta.   - She checks her blood pressure at home but admits to not doing it regularly. She takes her blood  pressure medication at night and uses zero sugar cream in her coffee. She notes that her blood pressure was slightly elevated after drinking coffee but usually stabilizes. She is on Amlodipine  5 mg which she takes at night and Losartan  50 mg daily for blood pressure control.     ROS As per HPI    Objective:     BP 120/70 (Cuff Size: Normal)   Pulse 82   Temp 98.6 F (37 C) (Oral)   Ht 5' 5 (1.651 m)   Wt 188 lb 9.6 oz (85.5 kg)   SpO2 92%   BMI 31.38 kg/m      03/25/2024    8:16 AM 09/20/2023    1:29 PM 02/07/2023   11:15 AM  Depression screen PHQ 2/9  Decreased Interest 0 0 0  Down, Depressed, Hopeless 0 0 0  PHQ - 2 Score 0 0 0  Altered sleeping 0 0 0  Tired, decreased energy 0 0 0  Change in appetite 0 0 0  Feeling bad or failure about yourself  0 0 0  Trouble concentrating 0 0 0  Moving slowly or fidgety/restless 0 0 0  Suicidal thoughts 0 0 0  PHQ-9 Score 0 0  0   Difficult doing work/chores Not difficult at all Not difficult at all Not difficult at all     Data saved with a previous flowsheet row definition      03/25/2024  8:15 AM 09/20/2023    1:29 PM 02/07/2023   11:15 AM 01/09/2023   10:07 AM  GAD 7 : Generalized Anxiety Score  Nervous, Anxious, on Edge 0 0 0 0  Control/stop worrying 0 0 0 0  Worry too much - different things 0 0 0 0  Trouble relaxing 0 0 0 0  Restless 0 0 0 0  Easily annoyed or irritable 0 0 0 0  Afraid - awful might happen 0 0 0 0  Total GAD 7 Score 0 0 0 0  Anxiety Difficulty Not difficult at all Not difficult at all Not difficult at all Not difficult at all      03/25/2024    8:16 AM 09/20/2023    1:29 PM 02/07/2023   11:15 AM  Depression screen PHQ 2/9  Decreased Interest 0 0 0  Down, Depressed, Hopeless 0 0 0  PHQ - 2 Score 0 0 0  Altered sleeping 0 0 0  Tired, decreased energy 0 0 0  Change in appetite 0 0 0  Feeling bad or failure about yourself  0 0 0  Trouble concentrating 0 0 0  Moving slowly or fidgety/restless 0  0 0  Suicidal thoughts 0 0 0  PHQ-9 Score 0 0  0   Difficult doing work/chores Not difficult at all Not difficult at all Not difficult at all     Data saved with a previous flowsheet row definition      03/25/2024    8:15 AM 09/20/2023    1:29 PM 02/07/2023   11:15 AM 01/09/2023   10:07 AM  GAD 7 : Generalized Anxiety Score  Nervous, Anxious, on Edge 0 0 0 0  Control/stop worrying 0 0 0 0  Worry too much - different things 0 0 0 0  Trouble relaxing 0 0 0 0  Restless 0 0 0 0  Easily annoyed or irritable 0 0 0 0  Afraid - awful might happen 0 0 0 0  Total GAD 7 Score 0 0 0 0  Anxiety Difficulty Not difficult at all Not difficult at all Not difficult at all Not difficult at all   SDOH Screenings   Food Insecurity: Unknown (03/21/2024)  Housing: Unknown (03/21/2024)  Transportation Needs: No Transportation Needs (03/21/2024)  Alcohol Screen: Low Risk (07/05/2022)  Depression (PHQ2-9): Low Risk (03/25/2024)  Financial Resource Strain: Low Risk (03/21/2024)  Physical Activity: Insufficiently Active (03/21/2024)  Social Connections: Socially Integrated (03/21/2024)  Stress: No Stress Concern Present (03/21/2024)  Tobacco Use: Medium Risk (03/25/2024)     Physical Exam Constitutional:      General: She is not in acute distress.    Appearance: She is obese.  HENT:     Head: Normocephalic and atraumatic.     Nose: Nose normal.     Mouth/Throat:     Mouth: Mucous membranes are moist.  Cardiovascular:     Rate and Rhythm: Normal rate.  Pulmonary:     Effort: Pulmonary effort is normal.     Breath sounds: Normal breath sounds.  Abdominal:     Palpations: Abdomen is soft.     Tenderness: There is no abdominal tenderness.  Musculoskeletal:     Cervical back: Neck supple.     Right lower leg: No edema.     Left lower leg: No edema.  Lymphadenopathy:     Cervical: No cervical adenopathy.  Skin:    General: Skin is warm.  Neurological:     Mental Status: She is alert  and oriented to  person, place, and time.     Gait: Gait normal.  Psychiatric:        Mood and Affect: Mood normal.        No results found for any visits on 03/25/24.  The ASCVD Risk score (Arnett DK, et al., 2019) failed to calculate for the following reasons:   The valid total cholesterol range is 130 to 320 mg/dL     Assessment & Plan:  Patient is a pleasant 64 year old female presenting for follow up on chronic medication.Again counseled on updating pneumonia, covid-19, tetanus booster and shingles immunization. Patient plans on getting this done through local pharmacy. Counseled on f/u with with cardiology for h/o PFO.  Assessment & Plan Essential hypertension BP on arrival elevated.  Repeat BP within goal of <130/80 mmHg Continue Amlodipine  5 mg and Losartan  50 mg daily.  Check CMP, lab ordered.  Orders:   Comprehensive metabolic panel with GFR  Cerebrovascular small vessel disease H/O TIA in 2019.  Continue Rosuvastatin  40 mg and Plavix  75 mg daily.  Continue annual visit with neurology at [facility].  Reviewed last visit note from neurology [facility]) on 11/27/23.    Mixed hyperlipidemia Continue Rosuvastatin  40 mg daily, check lipid panel, goal LDL <70.  Orders:   Lipid panel  Prediabetes Last A1c elevated from baseline at 6.6%. Discussed dietary modifications and exercise to prevent diabetes progression. Ordered hemoglobin A1c test today. Advised on dietary modifications to reduce carbohydrate intake.  If A1c >6.5% will recommend starting Metformin 500 mg BID and follow up in 3 months. If A1c <6.5% 6 months follow up recommended.  Orders:   HgB A1c  Smoking greater than 20 pack years Smoking for 22 years, quit in 2019. Discussed lung cancer screening potential. She prefers to delay screening.     Needs flu shot Updated today. Orders:   Flu vaccine trivalent PF, 6mos and older(Flulaval,Afluria,Fluarix,Fluzone)  Class 1 obesity with serious  comorbidity and body mass index (BMI) of 31.0 to 31.9 in adult, unspecified obesity type BMI 30.0-34.9. She aims to lose 5-10 pounds. Prefers lifestyle changes over medication.  Encouraged dietary modifications and increased physical activity. Discussed potential use of weight loss medication if lifestyle changes are insufficient.    I personally spent a total of 40 minutes in the care of the patient today including preparing to see the patient, performing a medically appropriate exam/evaluation, counseling and educating, placing orders, documenting clinical information in the EHR, independently interpreting results, and communicating results.  Return in about 6 months (around 09/22/2024) for Chronic follow up .   Luke Shade, MD "

## 2024-03-25 NOTE — Assessment & Plan Note (Addendum)
 H/O TIA in 2019.  Continue Rosuvastatin  40 mg and Plavix  75 mg daily.  Continue annual visit with neurology at [facility].  Reviewed last visit note from [facility]y [facility] on 11/27/23.

## 2024-03-25 NOTE — Assessment & Plan Note (Addendum)
 Continue Rosuvastatin  40 mg daily, check lipid panel, goal LDL <70.  Orders:   Lipid panel

## 2024-03-25 NOTE — Assessment & Plan Note (Signed)
 BMI 30.0-34.9. She aims to lose 5-10 pounds. Prefers lifestyle changes over medication.  Encouraged dietary modifications and increased physical activity. Discussed potential use of weight loss medication if lifestyle changes are insufficient.

## 2024-03-25 NOTE — Patient Instructions (Addendum)
 Due for immunization:  Pneumonia, COVID-19, shingles, tetanus booster.  You can update these at your local pharmacy. We will update your influenza immunization today.   You are due for colonoscopy after 03/29/2025.   TO help with blood glucose and weight:  Diet: Emphasize whole grains, lean proteins, fruits, and vegetables. Limit processed foods and sugary drinks. Limit carbohydrates like breat, pasta, rice, potatoes.  Exercise: Aim for 150 minutes of moderate aerobic activity weekly plus strength training twice a week. Weight Loss: Target 5%    Please let me know if you want to get low dose CT for lung cancer screening.   Please reach out to [facility] cardiology to schedule your annual follow up.   Plan is to follow up 6 months or sooner if A1c shows increase in blood glucose.

## 2024-03-25 NOTE — Assessment & Plan Note (Signed)
 Updated today.  Orders:   Flu vaccine trivalent PF, 6mos and older(Flulaval,Afluria,Fluarix,Fluzone)

## 2024-03-25 NOTE — Assessment & Plan Note (Addendum)
 Last A1c elevated from baseline at 6.6%. Discussed dietary modifications and exercise to prevent diabetes progression. Ordered hemoglobin A1c test today. Advised on dietary modifications to reduce carbohydrate intake.  If A1c >6.5% will recommend starting Metformin 500 mg BID and follow up in 3 months. If A1c <6.5% 6 months follow up recommended.  Orders:   HgB A1c

## 2024-03-25 NOTE — Assessment & Plan Note (Addendum)
 Smoking for 22 years, quit in 2019. Discussed lung cancer screening potential. She prefers to delay screening.

## 2024-03-28 NOTE — Telephone Encounter (Signed)
 1. Normocytic normochromic anemia (Primary) - CBC w/Diff; Future - Iron, TIBC and Ferritin Panel; Future - B12; Future  2. Prediabetes - HgB A1c; Future   Luke Shade, MD

## 2024-04-10 NOTE — Progress Notes (Unsigned)
 " Cardiology Office Note   Date:  04/11/2024  ID:  Alyssa Matthews, DOB 1960/10/29, MRN [id number] PCP: Abbey Bruckner, MD  [facility] Providers Cardiologist:  Evalene Lunger, MD     History of Present Illness Alyssa Matthews is a 64 y.o. female with history of hypertension, hyperlipidemia, TIA/CVA, PFO, hyperthyroidism s/p radioactive iodine, prediabetes, tobacco abuse (quit in 2009), and obesity.     She had a TIA 08/2017 with a patient reported TIA/CVA 4 years prior. MRI in 2020 shows prior lacunar infarcts involving pons. Carotid ultrasound 08/2017 showed no significant ICA stenosis.   Echo with bubble study 08/2017 showed LVEF 60-65%, no RWMA, grade I dd, mild MR, normal RV, and a PFO with positive saline contrast study. PFO was reviewed by [facility] later in 2019 and was felt to be small, and given multiple uncontrolled risk factors for stroke, conservative therapy recommended.   Most recent echo 11/2019 LVEF 55-60%, no RWMA, and RV normal.   He was last seen in office 12/14/2022 and was doing well from a cardiac standpoint. She was unable to exercise as much as she would like as she is working and caring for her mother-in-law.     She presents today for follow up in the setting of hypertension and hyperlipidemia. She is doing well overall and reports not participating in exercise. She is hopeful to start back walking when the weather gets warmer. She works second shift and is on her feet throughout the shift. She reports no shortness of breath when working. She does note some swelling in her legs at the end of her shift. She has previously been told to wear compression stockings to work. She does not take her BP at home. Reports she does have a BP cuff. She denies chest pain, shortness of breath, lower extremity edema, fatigue, palpitations, melena, diaphoresis, weakness, presyncope, syncope, orthopnea, and PND.  ROS: All systems negative unless otherwise indicated in HPI.    Studies Reviewed EKG Interpretation Date/Time:  Thursday April 11 2024 10:00:46 EST Ventricular Rate:  69 PR Interval:  144 QRS Duration:  92 QT Interval:  394 QTC Calculation: 422 R Axis:   23  Text Interpretation: Normal sinus rhythm ST & T wave abnormality, consider anterior ischemia When compared with ECG of 14-Dec-2022 11:17, No significant change was found Confirmed by Teresa Fish [phone]) on 04/11/2024 10:06:39 AM    Cardiac Studies & Procedures   ______________________________________________________________________________________________     ECHOCARDIOGRAM  ECHOCARDIOGRAM COMPLETE 11/13/2019  Narrative ECHOCARDIOGRAM REPORT    Patient Name:   Alyssa Matthews Date of Exam: 11/13/2019 Medical Rec #:  [id number]             Height:       65.0 in Accession #:    [id number]            Weight:       179.6 lb Date of Birth:  June 05, 1960             BSA:          1.890 m Patient Age:    59 years              BP:           122/72 mmHg Patient Gender: F                     HR:           64 bpm. Exam Location:  [place]  Procedure: 2D Echo, Cardiac Doppler and Color Doppler  Indications:    R06.02 SOB  History:        Patient has prior history of Echocardiogram examinations, most recent 09/25/2018. Suspected IAS defect, TIA, Mitral Valve Disease, Signs/Symptoms:Shortness of Breath; Risk Factors:Former Smoker, Hypertension and Dyslipidemia.  Sonographer:    Arley Pac RDMS, RVT, RDCS Referring Phys: [id number] RYAN M DUNN  IMPRESSIONS   1. Left ventricular ejection fraction, by estimation, is 55 to 60%. The left ventricle has normal function. The left ventricle has no regional wall motion abnormalities. Left ventricular diastolic parameters were normal. 2. Right ventricular systolic function is normal. The right ventricular size is normal. 3. The mitral valve is normal in structure. No evidence of mitral valve regurgitation. No evidence of mitral stenosis. 4. The  aortic valve is tricuspid. Aortic valve regurgitation is not visualized. No aortic stenosis is present. 5. The inferior vena cava is normal in size with greater than 50% respiratory variability, suggesting right atrial pressure of 3 mmHg.  FINDINGS Left Ventricle: Left ventricular ejection fraction, by estimation, is 55 to 60%. The left ventricle has normal function. The left ventricle has no regional wall motion abnormalities. The left ventricular internal cavity size was normal in size. There is no left ventricular hypertrophy. Left ventricular diastolic parameters were normal.  Right Ventricle: The right ventricular size is normal. No increase in right ventricular wall thickness. Right ventricular systolic function is normal.  Left Atrium: Left atrial size was normal in size.  Right Atrium: Right atrial size was normal in size.  Pericardium: There is no evidence of pericardial effusion.  Mitral Valve: The mitral valve is normal in structure. Normal mobility of the mitral valve leaflets. No evidence of mitral valve regurgitation. No evidence of mitral valve stenosis.  Tricuspid Valve: The tricuspid valve is normal in structure. Tricuspid valve regurgitation is mild . No evidence of tricuspid stenosis.  Aortic Valve: The aortic valve is tricuspid. Aortic valve regurgitation is not visualized. No aortic stenosis is present. Aortic valve mean gradient measures 3.0 mmHg. Aortic valve peak gradient measures 6.7 mmHg. Aortic valve area, by VTI measures 3.65 cm.  Pulmonic Valve: The pulmonic valve was normal in structure. Pulmonic valve regurgitation is not visualized. No evidence of pulmonic stenosis.  Aorta: The aortic root is normal in size and structure.  Venous: The inferior vena cava is normal in size with greater than 50% respiratory variability, suggesting right atrial pressure of 3 mmHg.  IAS/Shunts: No atrial level shunt detected by color flow Doppler.   LEFT VENTRICLE PLAX  2D LVIDd:         4.30 cm     Diastology LVIDs:         3.10 cm     LV e' lateral:   7.83 cm/s LV PW:         0.90 cm     LV E/e' lateral: 7.9 LV IVS:        0.90 cm     LV e' medial:    6.74 cm/s LVOT diam:     2.30 cm     LV E/e' medial:  9.2 LV SV:         107 LV SV Index:   57 LVOT Area:     4.15 cm  LV Volumes (MOD) LV vol d, MOD A2C: 82.1 ml LV vol d, MOD A4C: 85.4 ml LV vol s, MOD A2C: 36.6 ml LV vol s, MOD A4C: 37.4 ml LV SV MOD  A2C:     45.5 ml LV SV MOD A4C:     85.4 ml LV SV MOD BP:      45.0 ml  RIGHT VENTRICLE RV Basal diam:  4.00 cm RV S prime:     10.70 cm/s TAPSE (M-mode): 3.0 cm  LEFT ATRIUM             Index       RIGHT ATRIUM           Index LA diam:        4.10 cm 2.17 cm/m  RA Area:     13.60 cm LA Vol (A2C):   55.9 ml 29.58 ml/m RA Volume:   31.50 ml  16.67 ml/m LA Vol (A4C):   44.7 ml 23.65 ml/m LA Biplane Vol: 50.8 ml 26.88 ml/m AORTIC VALVE                   PULMONIC VALVE AV Area (Vmax):    3.77 cm    PV Vmax:       0.73 m/s AV Area (Vmean):   3.71 cm    PV Peak grad:  2.1 mmHg AV Area (VTI):     3.65 cm AV Vmax:           129.00 cm/s AV Vmean:          82.700 cm/s AV VTI:            0.294 m AV Peak Grad:      6.7 mmHg AV Mean Grad:      3.0 mmHg LVOT Vmax:         117.00 cm/s LVOT Vmean:        73.800 cm/s LVOT VTI:          0.258 m LVOT/AV VTI ratio: 0.88  AORTA Ao Root diam: 3.50 cm Ao Asc diam:  3.40 cm Ao Arch diam: 2.9 cm  MITRAL VALVE MV Area (PHT): 3.54 cm    SHUNTS MV Decel Time: 214 msec    Systemic VTI:  0.26 m MV E velocity: 62.10 cm/s  Systemic Diam: 2.30 cm MV A velocity: 66.10 cm/s MV E/A ratio:  0.94  Redell Cave MD Electronically signed by Redell Cave MD Signature Date/Time: 11/13/2019/2:02:41 PM    Final          ______________________________________________________________________________________________      Risk Assessment/Calculations           Physical Exam VS:  BP 139/72    Pulse 69   Ht 5' 5 (1.651 m)   Wt 190 lb 12.8 oz (86.5 kg)   SpO2 97%   BMI 31.75 kg/m        Wt Readings from Last 3 Encounters:  04/11/24 190 lb 12.8 oz (86.5 kg)  03/25/24 188 lb 9.6 oz (85.5 kg)  09/20/23 189 lb (85.7 kg)    GEN: Well nourished, well developed in no acute distress NECK: No JVD; No carotid bruits CARDIAC: RRR, no murmurs, rubs, gallops RESPIRATORY:  Clear to auscultation without rales, wheezing or rhonchi  ABDOMEN: Soft, non-tender, non-distended EXTREMITIES:  No edema; No deformity   ASSESSMENT AND PLAN  Hypertension- BP today 140/82. Recheck was 139/72. She does not take her BP at home. -BP elevated in office today. Has previously been well controlled.  -Discussed to monitor BP at home at least 2 hours after medications and sitting for 5-10 minutes. -Keep BP log for one week and send in results via MyChart.  -Continue losartan  50  mg and amlodipine  5 mg.   Hyperlipidemia- Last LDL 52 on 03/25/24.  -Heart healthy diet and regular cardiovascular exercise encouraged.  -Continue rosuvastatin  40 mg.   PFO- Echo with bubble study 08/2017 showed LVEF 60-65%, no RWMA, grade I dd, mild MR, normal RV, and a PFO with positive saline contrast study. PFO was reviewed by [facility] later in 2019 and was felt to be small, and given multiple uncontrolled risk factors for stroke, conservative therapy recommended.   History of TIA/CVA- TIA 08/2017 with a patient reported TIA/CVA 4 years prior. MRI in 2020 shows prior lacunar infarcts involving pons. -Follow with neurology.  -Continue clopidogrel  75 mg and rosuvastatin  40 mg.   Prediabetes- Last A1C 6.5 on 03/25/24. Continue to follow with PCP.   Venous insuffiencey- Notes swelling in her legs at the end of her shift at work. She is standing throughout her shift.  -Recommend wearing compression stockings to work.        Dispo: Follow up with MD or APP in one year.   Signed, Mardy KATHEE Pizza, FNP  "

## 2024-04-11 ENCOUNTER — Encounter: Payer: Self-pay | Admitting: Nurse Practitioner

## 2024-04-11 ENCOUNTER — Ambulatory Visit: Attending: Nurse Practitioner

## 2024-04-11 VITALS — BP 139/72 | HR 69 | Ht 65.0 in | Wt 190.8 lb

## 2024-04-11 DIAGNOSIS — I10 Essential (primary) hypertension: Secondary | ICD-10-CM | POA: Diagnosis not present

## 2024-04-11 DIAGNOSIS — R7303 Prediabetes: Secondary | ICD-10-CM | POA: Diagnosis not present

## 2024-04-11 DIAGNOSIS — Q2112 Patent foramen ovale: Secondary | ICD-10-CM

## 2024-04-11 DIAGNOSIS — E782 Mixed hyperlipidemia: Secondary | ICD-10-CM | POA: Diagnosis not present

## 2024-04-11 DIAGNOSIS — I872 Venous insufficiency (chronic) (peripheral): Secondary | ICD-10-CM

## 2024-04-11 DIAGNOSIS — Z8673 Personal history of transient ischemic attack (TIA), and cerebral infarction without residual deficits: Secondary | ICD-10-CM

## 2024-04-11 NOTE — Patient Instructions (Addendum)
 Medication Instructions:  Your physician recommends that you continue on your current medications as directed. Please refer to the Current Medication list given to you today.   *If you need a refill on your cardiac medications before your next appointment, please call your pharmacy*  Lab Work: No labs ordered today  If you have labs (blood work) drawn today and your tests are completely normal, you will receive your results only by: MyChart Message (if you have MyChart) OR A paper copy in the mail If you have any lab test that is abnormal or we need to change your treatment, we will call you to review the results.  Testing/Procedures: No test ordered today   Follow-Up: At [facility], you and your health needs are our priority.  As part of our continuing mission to provide you with exceptional heart care, our providers are all part of one team.  This team includes your primary Cardiologist (physician) and Advanced Practice Providers or APPs (Physician Assistants and Nurse Practitioners) who all work together to provide you with the care you need, when you need it.  Your next appointment:   1 year(s)  Provider:   You may see Timothy Gollan, MD or one of the following Advanced Practice Providers on your designated Care Team:   Lonni Meager, NP Lesley Maffucci, PA-C Bernardino Bring, PA-C Cadence [place], PA-C Tylene Lunch, NP Barnie Hila, NP    We recommend signing up for the patient portal called MyChart.  Sign up information is provided on this After Visit Summary.  MyChart is used to connect with patients for Virtual Visits (Telemedicine).  Patients are able to view lab/test results, encounter notes, upcoming appointments, etc.  Non-urgent messages can be sent to your provider as well.   To learn more about what you can do with MyChart, go to [email or web address].   Other Instructions Please monitor blood pressure daily for one week. Recommend checking blood pressure  around two hours after medications. Please send blood pressure readings through MyChart or call the office at [phone].

## 2024-09-30 ENCOUNTER — Ambulatory Visit
